# Patient Record
Sex: Female | Born: 1950 | Race: White | Hispanic: No | Smoking: Current some day smoker
Health system: Southern US, Community
[De-identification: ages and names within clinical notes are randomized; demographics above are authoritative.]

## PROBLEM LIST (undated history)

## (undated) DIAGNOSIS — D649 Anemia, unspecified: Secondary | ICD-10-CM

## (undated) DIAGNOSIS — C801 Malignant (primary) neoplasm, unspecified: Secondary | ICD-10-CM

## (undated) DIAGNOSIS — Z9289 Personal history of other medical treatment: Secondary | ICD-10-CM

## (undated) DIAGNOSIS — T4145XA Adverse effect of unspecified anesthetic, initial encounter: Secondary | ICD-10-CM

## (undated) DIAGNOSIS — R251 Tremor, unspecified: Secondary | ICD-10-CM

## (undated) DIAGNOSIS — I10 Essential (primary) hypertension: Secondary | ICD-10-CM

---

## 1998-10-29 ENCOUNTER — Ambulatory Visit (HOSPITAL_COMMUNITY): Admission: RE | Admit: 1998-10-29 | Discharge: 1998-10-29 | Payer: Self-pay | Admitting: Obstetrics and Gynecology

## 1998-10-29 ENCOUNTER — Encounter: Payer: Self-pay | Admitting: Obstetrics and Gynecology

## 1999-07-21 ENCOUNTER — Other Ambulatory Visit: Admission: RE | Admit: 1999-07-21 | Discharge: 1999-07-21 | Payer: Self-pay | Admitting: Obstetrics and Gynecology

## 1999-11-19 ENCOUNTER — Other Ambulatory Visit: Admission: RE | Admit: 1999-11-19 | Discharge: 1999-11-19 | Payer: Self-pay | Admitting: Obstetrics and Gynecology

## 1999-12-03 ENCOUNTER — Ambulatory Visit (HOSPITAL_COMMUNITY): Admission: RE | Admit: 1999-12-03 | Discharge: 1999-12-03 | Payer: Self-pay | Admitting: Obstetrics and Gynecology

## 1999-12-03 ENCOUNTER — Encounter: Payer: Self-pay | Admitting: Obstetrics and Gynecology

## 2000-09-07 ENCOUNTER — Other Ambulatory Visit: Admission: RE | Admit: 2000-09-07 | Discharge: 2000-09-07 | Payer: Self-pay | Admitting: Obstetrics and Gynecology

## 2001-03-10 ENCOUNTER — Other Ambulatory Visit: Admission: RE | Admit: 2001-03-10 | Discharge: 2001-03-10 | Payer: Self-pay | Admitting: Obstetrics and Gynecology

## 2002-02-06 ENCOUNTER — Other Ambulatory Visit: Admission: RE | Admit: 2002-02-06 | Discharge: 2002-02-06 | Payer: Self-pay | Admitting: Obstetrics and Gynecology

## 2002-05-01 ENCOUNTER — Ambulatory Visit (HOSPITAL_COMMUNITY): Admission: RE | Admit: 2002-05-01 | Discharge: 2002-05-01 | Payer: Self-pay | Admitting: Obstetrics and Gynecology

## 2002-05-01 ENCOUNTER — Encounter: Payer: Self-pay | Admitting: Obstetrics and Gynecology

## 2002-06-29 ENCOUNTER — Other Ambulatory Visit: Admission: RE | Admit: 2002-06-29 | Discharge: 2002-06-29 | Payer: Self-pay | Admitting: Obstetrics and Gynecology

## 2003-04-23 ENCOUNTER — Other Ambulatory Visit: Admission: RE | Admit: 2003-04-23 | Discharge: 2003-04-23 | Payer: Self-pay | Admitting: Obstetrics and Gynecology

## 2003-05-06 ENCOUNTER — Ambulatory Visit (HOSPITAL_COMMUNITY): Admission: RE | Admit: 2003-05-06 | Discharge: 2003-05-06 | Payer: Self-pay | Admitting: Obstetrics and Gynecology

## 2003-05-06 ENCOUNTER — Encounter: Payer: Self-pay | Admitting: Obstetrics and Gynecology

## 2003-12-13 ENCOUNTER — Ambulatory Visit (HOSPITAL_COMMUNITY): Admission: RE | Admit: 2003-12-13 | Discharge: 2003-12-13 | Payer: Self-pay | Admitting: Internal Medicine

## 2003-12-13 ENCOUNTER — Encounter (INDEPENDENT_AMBULATORY_CARE_PROVIDER_SITE_OTHER): Payer: Self-pay | Admitting: *Deleted

## 2004-01-06 ENCOUNTER — Ambulatory Visit (HOSPITAL_COMMUNITY): Admission: RE | Admit: 2004-01-06 | Discharge: 2004-01-06 | Payer: Self-pay | Admitting: Thoracic Surgery

## 2004-01-10 ENCOUNTER — Other Ambulatory Visit: Admission: RE | Admit: 2004-01-10 | Discharge: 2004-01-10 | Payer: Self-pay | Admitting: Obstetrics and Gynecology

## 2004-01-16 ENCOUNTER — Inpatient Hospital Stay (HOSPITAL_COMMUNITY): Admission: RE | Admit: 2004-01-16 | Discharge: 2004-01-28 | Payer: Self-pay | Admitting: Thoracic Surgery

## 2004-01-16 ENCOUNTER — Encounter (INDEPENDENT_AMBULATORY_CARE_PROVIDER_SITE_OTHER): Payer: Self-pay | Admitting: Specialist

## 2004-02-06 ENCOUNTER — Encounter: Admission: RE | Admit: 2004-02-06 | Discharge: 2004-02-06 | Payer: Self-pay | Admitting: Thoracic Surgery

## 2004-02-13 ENCOUNTER — Ambulatory Visit (HOSPITAL_COMMUNITY): Admission: RE | Admit: 2004-02-13 | Discharge: 2004-02-13 | Payer: Self-pay | Admitting: Thoracic Surgery

## 2004-02-20 ENCOUNTER — Encounter (HOSPITAL_COMMUNITY): Admission: RE | Admit: 2004-02-20 | Discharge: 2004-04-10 | Payer: Self-pay | Admitting: Hematology & Oncology

## 2004-03-03 ENCOUNTER — Encounter: Admission: RE | Admit: 2004-03-03 | Discharge: 2004-03-03 | Payer: Self-pay | Admitting: Thoracic Surgery

## 2004-04-21 ENCOUNTER — Encounter: Admission: RE | Admit: 2004-04-21 | Discharge: 2004-04-21 | Payer: Self-pay | Admitting: Thoracic Surgery

## 2004-05-15 ENCOUNTER — Ambulatory Visit (HOSPITAL_COMMUNITY): Admission: RE | Admit: 2004-05-15 | Discharge: 2004-05-15 | Payer: Self-pay | Admitting: Hematology & Oncology

## 2004-05-27 ENCOUNTER — Ambulatory Visit: Payer: Self-pay | Admitting: Hematology & Oncology

## 2004-06-02 ENCOUNTER — Encounter: Admission: RE | Admit: 2004-06-02 | Discharge: 2004-06-02 | Payer: Self-pay | Admitting: Thoracic Surgery

## 2004-06-12 ENCOUNTER — Ambulatory Visit (HOSPITAL_COMMUNITY): Admission: RE | Admit: 2004-06-12 | Discharge: 2004-06-12 | Payer: Self-pay | Admitting: Thoracic Surgery

## 2004-07-02 ENCOUNTER — Other Ambulatory Visit: Admission: RE | Admit: 2004-07-02 | Discharge: 2004-07-02 | Payer: Self-pay | Admitting: Obstetrics and Gynecology

## 2004-08-18 ENCOUNTER — Encounter: Admission: RE | Admit: 2004-08-18 | Discharge: 2004-08-18 | Payer: Self-pay | Admitting: Thoracic Surgery

## 2004-08-19 ENCOUNTER — Ambulatory Visit (HOSPITAL_COMMUNITY): Admission: RE | Admit: 2004-08-19 | Discharge: 2004-08-19 | Payer: Self-pay | Admitting: Hematology & Oncology

## 2004-08-27 ENCOUNTER — Ambulatory Visit: Payer: Self-pay | Admitting: Hematology & Oncology

## 2004-11-13 ENCOUNTER — Ambulatory Visit (HOSPITAL_COMMUNITY): Admission: RE | Admit: 2004-11-13 | Discharge: 2004-11-13 | Payer: Self-pay | Admitting: Hematology & Oncology

## 2004-11-26 ENCOUNTER — Ambulatory Visit: Payer: Self-pay | Admitting: Hematology & Oncology

## 2004-12-02 ENCOUNTER — Encounter: Admission: RE | Admit: 2004-12-02 | Discharge: 2004-12-02 | Payer: Self-pay | Admitting: Thoracic Surgery

## 2005-02-23 ENCOUNTER — Ambulatory Visit (HOSPITAL_COMMUNITY): Admission: RE | Admit: 2005-02-23 | Discharge: 2005-02-23 | Payer: Self-pay | Admitting: Hematology & Oncology

## 2005-03-02 ENCOUNTER — Other Ambulatory Visit: Admission: RE | Admit: 2005-03-02 | Discharge: 2005-03-02 | Payer: Self-pay | Admitting: *Deleted

## 2005-03-04 ENCOUNTER — Ambulatory Visit (HOSPITAL_COMMUNITY): Admission: RE | Admit: 2005-03-04 | Discharge: 2005-03-04 | Payer: Self-pay | Admitting: Internal Medicine

## 2005-03-11 ENCOUNTER — Ambulatory Visit: Payer: Self-pay | Admitting: Hematology & Oncology

## 2005-04-13 ENCOUNTER — Encounter: Admission: RE | Admit: 2005-04-13 | Discharge: 2005-04-13 | Payer: Self-pay | Admitting: Thoracic Surgery

## 2005-06-18 ENCOUNTER — Ambulatory Visit (HOSPITAL_COMMUNITY): Admission: RE | Admit: 2005-06-18 | Discharge: 2005-06-18 | Payer: Self-pay | Admitting: Hematology & Oncology

## 2005-06-24 ENCOUNTER — Ambulatory Visit: Payer: Self-pay | Admitting: Hematology & Oncology

## 2005-07-12 DIAGNOSIS — T8859XA Other complications of anesthesia, initial encounter: Secondary | ICD-10-CM

## 2005-07-12 DIAGNOSIS — C801 Malignant (primary) neoplasm, unspecified: Secondary | ICD-10-CM

## 2005-07-12 HISTORY — DX: Other complications of anesthesia, initial encounter: T88.59XA

## 2005-07-12 HISTORY — PX: OTHER SURGICAL HISTORY: SHX169

## 2005-07-12 HISTORY — DX: Malignant (primary) neoplasm, unspecified: C80.1

## 2005-09-27 ENCOUNTER — Other Ambulatory Visit: Admission: RE | Admit: 2005-09-27 | Discharge: 2005-09-27 | Payer: Self-pay | Admitting: Obstetrics & Gynecology

## 2005-10-14 ENCOUNTER — Ambulatory Visit: Payer: Self-pay | Admitting: Hematology & Oncology

## 2005-10-15 LAB — CBC WITH DIFFERENTIAL/PLATELET
Basophils Absolute: 0 10*3/uL (ref 0.0–0.1)
HCT: 40 % (ref 34.8–46.6)
HGB: 13.4 g/dL (ref 11.6–15.9)
LYMPH%: 39.2 % (ref 14.0–48.0)
MCH: 31.3 pg (ref 26.0–34.0)
MCHC: 33.6 g/dL (ref 32.0–36.0)
MONO#: 0.6 10*3/uL (ref 0.1–0.9)
NEUT%: 45.6 % (ref 39.6–76.8)
Platelets: 285 10*3/uL (ref 145–400)
lymph#: 1.9 10*3/uL (ref 0.9–3.3)

## 2005-10-15 LAB — COMPREHENSIVE METABOLIC PANEL
ALT: 16 U/L (ref 0–40)
AST: 26 U/L (ref 0–37)
Albumin: 4.8 g/dL (ref 3.5–5.2)
Alkaline Phosphatase: 49 U/L (ref 39–117)
BUN: 12 mg/dL (ref 6–23)
CO2: 28 mEq/L (ref 19–32)
Calcium: 9.3 mg/dL (ref 8.4–10.5)
Chloride: 103 mEq/L (ref 96–112)
Creatinine, Ser: 0.7 mg/dL (ref 0.4–1.2)
Glucose, Bld: 93 mg/dL (ref 70–99)
Potassium: 4 mEq/L (ref 3.5–5.3)
Sodium: 139 mEq/L (ref 135–145)
Total Bilirubin: 0.4 mg/dL (ref 0.3–1.2)
Total Protein: 7.1 g/dL (ref 6.0–8.3)

## 2005-10-19 ENCOUNTER — Ambulatory Visit (HOSPITAL_COMMUNITY): Admission: RE | Admit: 2005-10-19 | Discharge: 2005-10-19 | Payer: Self-pay | Admitting: Hematology & Oncology

## 2005-10-20 ENCOUNTER — Encounter: Admission: RE | Admit: 2005-10-20 | Discharge: 2005-10-20 | Payer: Self-pay | Admitting: Thoracic Surgery

## 2006-01-06 ENCOUNTER — Other Ambulatory Visit: Admission: RE | Admit: 2006-01-06 | Discharge: 2006-01-06 | Payer: Self-pay | Admitting: Obstetrics & Gynecology

## 2006-02-09 ENCOUNTER — Ambulatory Visit: Payer: Self-pay | Admitting: Hematology & Oncology

## 2006-02-11 LAB — COMPREHENSIVE METABOLIC PANEL
ALT: 12 U/L (ref 0–40)
AST: 20 U/L (ref 0–37)
BUN: 13 mg/dL (ref 6–23)
Calcium: 10 mg/dL (ref 8.4–10.5)
Chloride: 102 mEq/L (ref 96–112)
Creatinine, Ser: 0.67 mg/dL (ref 0.40–1.20)
Total Bilirubin: 0.4 mg/dL (ref 0.3–1.2)

## 2006-02-11 LAB — CBC WITH DIFFERENTIAL/PLATELET
Eosinophils Absolute: 0.1 10*3/uL (ref 0.0–0.5)
LYMPH%: 39.4 % (ref 14.0–48.0)
MONO#: 0.6 10*3/uL (ref 0.1–0.9)
NEUT#: 1.8 10*3/uL (ref 1.5–6.5)
Platelets: 293 10*3/uL (ref 145–400)
RBC: 4.12 10*6/uL (ref 3.70–5.32)
WBC: 4.1 10*3/uL (ref 3.9–10.0)

## 2006-02-18 ENCOUNTER — Ambulatory Visit (HOSPITAL_COMMUNITY): Admission: RE | Admit: 2006-02-18 | Discharge: 2006-02-18 | Payer: Self-pay | Admitting: Hematology & Oncology

## 2006-04-08 IMAGING — CR DG CHEST 1V PORT
1 series · 1 of 1 positions shown · non-contrast
Comparison: None.

CLINICAL DATA: Post-bronchoscopy on the right for a right mid lung opacity.
 CHEST PORTABLE ONE VIEW - 12/13/03 AT 4665 HOURS

[view not recorded]
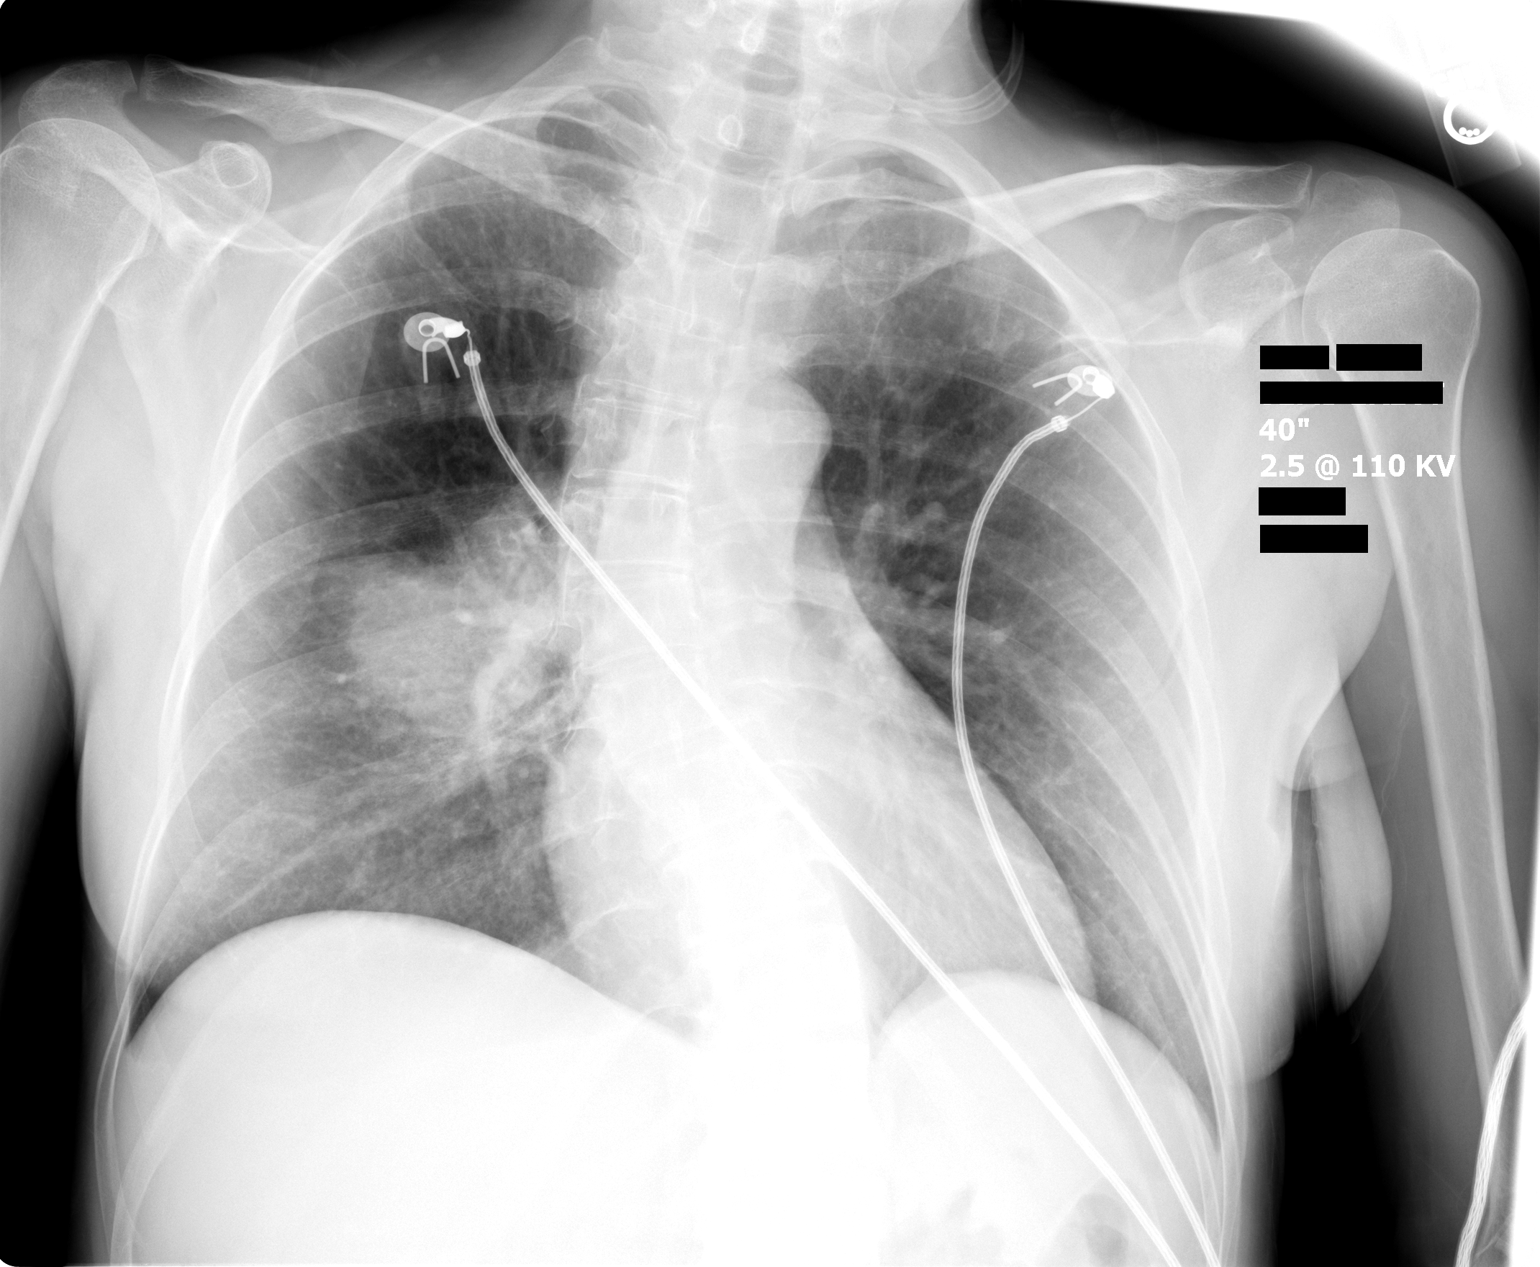

[1 of 1 positions shown; findings below may reference images not displayed]

There is no evidence of pneumothorax or pneumomediastinum post-bronchoscopy.  Mass-like opacity in the right mid lung is noted.  The lungs appear clear otherwise.  Thoracic scoliosis convex right with compensatory thoracolumbar scoliosis convex left is noted.  
 IMPRESSION
 No pneumothorax or pneumomediastinum post bronchoscopy  Mass-like opacity in the right mid lung.

## 2006-05-02 IMAGING — CT NM PET TUM IMG SKULL BASE T - THIGH
1 of 3 series · 1 of 25 positions shown · IV contrast ([ID])
Comparison: No CT images are available for comparison.  Comparison made to chest x-ray done 12/13/03.

CLINICAL DATA: Right lower lobe mass seen on CT done at [HOSPITAL].

 FDG PET-CT TUMOR IMAGING (SKULL BASE TO THIGHS)
 Patient Weight:  125 pounds.  
 Fasting Blood Glucose: 91
TECHNIQUE: 15.7 mCi F-18 FDG were administered via right antecubital fossa.  Full ring PET imaging was performed from the skull base through the mid-thighs 60 minutes after injection.  CT data was obtained and used for attenuation correction and anatomic localization only.  (This was not acquired as a diagnostic CT examination.)

[Series 2: ct images · axial · 3.8mm · 0.98mm/px · 1 of 267 slices shown]
[im 267/267  brain]
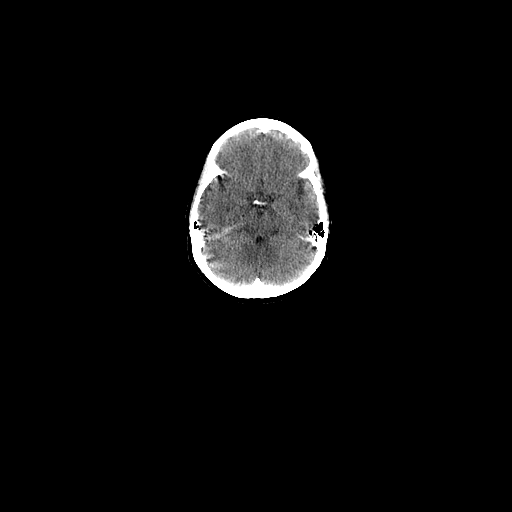

[1 of 25 positions shown; findings below may reference images not displayed]

FINDINGS: There is avid FDG accumulation noted in the right perihilar mass.  This has a maximum SUV of 15.5.  On the associated CT images obtained for CT PET fusion, this mass in the superior segment of the right lower lobe and has small areas of cavitation centrally.  No abnormal FDG accumulation noted in the right hilum or mediastinum.  No other areas of abnormal FDG accumulation seen in the neck, abdomen or pelvis.  
 IMPRESSION
 Marked hypermetabolic activity within the large mass in the superior segment of the right lower lobe compatible with lung cancer.  There is no evidence of abnormal hilar or mediastinal activity or other metastatic disease.

## 2006-05-11 ENCOUNTER — Encounter: Admission: RE | Admit: 2006-05-11 | Discharge: 2006-05-11 | Payer: Self-pay | Admitting: Thoracic Surgery

## 2006-07-29 ENCOUNTER — Ambulatory Visit (HOSPITAL_COMMUNITY): Admission: RE | Admit: 2006-07-29 | Discharge: 2006-07-29 | Payer: Self-pay | Admitting: Obstetrics and Gynecology

## 2006-08-16 ENCOUNTER — Ambulatory Visit: Payer: Self-pay | Admitting: Hematology & Oncology

## 2006-08-17 ENCOUNTER — Ambulatory Visit (HOSPITAL_COMMUNITY): Admission: RE | Admit: 2006-08-17 | Discharge: 2006-08-17 | Payer: Self-pay | Admitting: Hematology & Oncology

## 2006-08-19 LAB — CBC WITH DIFFERENTIAL/PLATELET
BASO%: 0.6 % (ref 0.0–2.0)
Basophils Absolute: 0 10*3/uL (ref 0.0–0.1)
Eosinophils Absolute: 0.1 10*3/uL (ref 0.0–0.5)
HCT: 34.6 % — ABNORMAL LOW (ref 34.8–46.6)
HGB: 11.8 g/dL (ref 11.6–15.9)
MONO#: 0.5 10*3/uL (ref 0.1–0.9)
NEUT#: 2.8 10*3/uL (ref 1.5–6.5)
NEUT%: 56.6 % (ref 39.6–76.8)
Platelets: 320 10*3/uL (ref 145–400)
WBC: 5 10*3/uL (ref 3.9–10.0)
lymph#: 1.6 10*3/uL (ref 0.9–3.3)

## 2006-08-19 LAB — COMPREHENSIVE METABOLIC PANEL
ALT: 15 U/L (ref 0–35)
BUN: 11 mg/dL (ref 6–23)
CO2: 28 mEq/L (ref 19–32)
Calcium: 10 mg/dL (ref 8.4–10.5)
Chloride: 101 mEq/L (ref 96–112)
Creatinine, Ser: 0.54 mg/dL (ref 0.40–1.20)
Glucose, Bld: 116 mg/dL — ABNORMAL HIGH (ref 70–99)

## 2006-12-27 ENCOUNTER — Other Ambulatory Visit: Admission: RE | Admit: 2006-12-27 | Discharge: 2006-12-27 | Payer: Self-pay | Admitting: Obstetrics and Gynecology

## 2007-02-15 ENCOUNTER — Ambulatory Visit: Payer: Self-pay | Admitting: Hematology & Oncology

## 2007-02-17 LAB — CBC WITH DIFFERENTIAL/PLATELET
BASO%: 0.4 % (ref 0.0–2.0)
Eosinophils Absolute: 0.2 10*3/uL (ref 0.0–0.5)
HCT: 29.7 % — ABNORMAL LOW (ref 34.8–46.6)
MCHC: 32.4 g/dL (ref 32.0–36.0)
MONO#: 1.6 10*3/uL — ABNORMAL HIGH (ref 0.1–0.9)
NEUT#: 5.2 10*3/uL (ref 1.5–6.5)
NEUT%: 60.6 % (ref 39.6–76.8)
WBC: 8.7 10*3/uL (ref 3.9–10.0)
lymph#: 1.6 10*3/uL (ref 0.9–3.3)

## 2007-02-17 LAB — COMPREHENSIVE METABOLIC PANEL
ALT: 16 U/L (ref 0–35)
BUN: 10 mg/dL (ref 6–23)
CO2: 24 mEq/L (ref 19–32)
Calcium: 9.3 mg/dL (ref 8.4–10.5)
Chloride: 101 mEq/L (ref 96–112)
Creatinine, Ser: 0.65 mg/dL (ref 0.40–1.20)

## 2007-02-21 ENCOUNTER — Ambulatory Visit (HOSPITAL_COMMUNITY): Admission: RE | Admit: 2007-02-21 | Discharge: 2007-02-21 | Payer: Self-pay | Admitting: Hematology & Oncology

## 2007-04-03 ENCOUNTER — Ambulatory Visit (HOSPITAL_COMMUNITY): Admission: RE | Admit: 2007-04-03 | Discharge: 2007-04-03 | Payer: Self-pay | Admitting: Hematology & Oncology

## 2007-06-29 ENCOUNTER — Other Ambulatory Visit: Admission: RE | Admit: 2007-06-29 | Discharge: 2007-06-29 | Payer: Self-pay | Admitting: Obstetrics and Gynecology

## 2007-08-14 ENCOUNTER — Ambulatory Visit: Payer: Self-pay | Admitting: Hematology & Oncology

## 2007-08-16 LAB — CBC WITH DIFFERENTIAL/PLATELET
Basophils Absolute: 0 10*3/uL (ref 0.0–0.1)
Eosinophils Absolute: 0.1 10*3/uL (ref 0.0–0.5)
HCT: 32.2 % — ABNORMAL LOW (ref 34.8–46.6)
HGB: 10.7 g/dL — ABNORMAL LOW (ref 11.6–15.9)
LYMPH%: 28.6 % (ref 14.0–48.0)
MONO#: 0.7 10*3/uL (ref 0.1–0.9)
NEUT#: 3.3 10*3/uL (ref 1.5–6.5)
NEUT%: 57.8 % (ref 39.6–76.8)
Platelets: 293 10*3/uL (ref 145–400)
WBC: 5.7 10*3/uL (ref 3.9–10.0)

## 2007-08-16 LAB — COMPREHENSIVE METABOLIC PANEL
CO2: 29 mEq/L (ref 19–32)
Creatinine, Ser: 0.78 mg/dL (ref 0.40–1.20)
Glucose, Bld: 103 mg/dL — ABNORMAL HIGH (ref 70–99)
Sodium: 138 mEq/L (ref 135–145)
Total Bilirubin: 0.6 mg/dL (ref 0.3–1.2)
Total Protein: 6.4 g/dL (ref 6.0–8.3)

## 2007-08-17 ENCOUNTER — Ambulatory Visit (HOSPITAL_COMMUNITY): Admission: RE | Admit: 2007-08-17 | Discharge: 2007-08-17 | Payer: Self-pay | Admitting: Hematology & Oncology

## 2008-01-08 ENCOUNTER — Other Ambulatory Visit: Admission: RE | Admit: 2008-01-08 | Discharge: 2008-01-08 | Payer: Self-pay | Admitting: Obstetrics & Gynecology

## 2008-02-10 ENCOUNTER — Ambulatory Visit: Payer: Self-pay | Admitting: Hematology & Oncology

## 2008-02-14 ENCOUNTER — Ambulatory Visit (HOSPITAL_COMMUNITY): Admission: RE | Admit: 2008-02-14 | Discharge: 2008-02-14 | Payer: Self-pay | Admitting: Hematology & Oncology

## 2008-02-14 LAB — CBC WITH DIFFERENTIAL/PLATELET
BASO%: 1.3 % (ref 0.0–2.0)
EOS%: 3 % (ref 0.0–7.0)
LYMPH%: 24.1 % (ref 14.0–48.0)
MCHC: 32.5 g/dL (ref 32.0–36.0)
MONO#: 0.4 10*3/uL (ref 0.1–0.9)
MONO%: 10.5 % (ref 0.0–13.0)
Platelets: 334 10*3/uL (ref 145–400)
RBC: 4.36 10*6/uL (ref 3.70–5.32)
WBC: 4.1 10*3/uL (ref 3.9–10.0)

## 2008-02-14 LAB — COMPREHENSIVE METABOLIC PANEL
Alkaline Phosphatase: 57 U/L (ref 39–117)
BUN: 11 mg/dL (ref 6–23)
Glucose, Bld: 88 mg/dL (ref 70–99)
Sodium: 140 mEq/L (ref 135–145)
Total Bilirubin: 0.6 mg/dL (ref 0.3–1.2)

## 2008-02-21 ENCOUNTER — Ambulatory Visit: Payer: Self-pay | Admitting: Hematology & Oncology

## 2008-04-09 ENCOUNTER — Ambulatory Visit: Payer: Self-pay | Admitting: Hematology & Oncology

## 2008-04-10 LAB — CBC WITH DIFFERENTIAL (CANCER CENTER ONLY)
BASO#: 0.1 10*3/uL (ref 0.0–0.2)
EOS%: 2.6 % (ref 0.0–7.0)
Eosinophils Absolute: 0.1 10*3/uL (ref 0.0–0.5)
HCT: 36.7 % (ref 34.8–46.6)
HGB: 12.4 g/dL (ref 11.6–15.9)
MCH: 27.5 pg (ref 26.0–34.0)
MCHC: 33.7 g/dL (ref 32.0–36.0)
MCV: 82 fL (ref 81–101)
MONO%: 10.4 % (ref 0.0–13.0)
NEUT%: 57.6 % (ref 39.6–80.0)
RBC: 4.49 10*6/uL (ref 3.70–5.32)

## 2008-04-12 LAB — RETICULOCYTES (CHCC)
ABS Retic: 40.1 10*3/uL (ref 19.0–186.0)
RBC.: 4.45 MIL/uL (ref 3.87–5.11)

## 2008-05-23 ENCOUNTER — Other Ambulatory Visit: Admission: RE | Admit: 2008-05-23 | Discharge: 2008-05-23 | Payer: Self-pay | Admitting: Obstetrics & Gynecology

## 2008-07-12 DIAGNOSIS — Z9289 Personal history of other medical treatment: Secondary | ICD-10-CM

## 2008-07-12 HISTORY — DX: Personal history of other medical treatment: Z92.89

## 2008-08-01 ENCOUNTER — Encounter: Admission: RE | Admit: 2008-08-01 | Discharge: 2008-08-01 | Payer: Self-pay | Admitting: Internal Medicine

## 2008-08-12 ENCOUNTER — Ambulatory Visit: Payer: Self-pay | Admitting: Hematology & Oncology

## 2008-08-13 ENCOUNTER — Ambulatory Visit (HOSPITAL_BASED_OUTPATIENT_CLINIC_OR_DEPARTMENT_OTHER): Admission: RE | Admit: 2008-08-13 | Discharge: 2008-08-13 | Payer: Self-pay | Admitting: Hematology & Oncology

## 2008-08-13 ENCOUNTER — Ambulatory Visit: Payer: Self-pay | Admitting: Diagnostic Radiology

## 2008-08-13 LAB — CMP (CANCER CENTER ONLY)
ALT(SGPT): 19 U/L (ref 10–47)
CO2: 31 mEq/L (ref 18–33)
Creat: 0.6 mg/dl (ref 0.6–1.2)
Total Bilirubin: 0.6 mg/dl (ref 0.20–1.60)

## 2008-08-28 LAB — CBC WITH DIFFERENTIAL (CANCER CENTER ONLY)
BASO#: 0.1 10*3/uL (ref 0.0–0.2)
Eosinophils Absolute: 0.1 10*3/uL (ref 0.0–0.5)
HCT: 41.9 % (ref 34.8–46.6)
HGB: 13.7 g/dL (ref 11.6–15.9)
LYMPH#: 1.3 10*3/uL (ref 0.9–3.3)
MCHC: 32.8 g/dL (ref 32.0–36.0)
MONO#: 0.5 10*3/uL (ref 0.1–0.9)
NEUT%: 54.9 % (ref 39.6–80.0)
RBC: 4.46 10*6/uL (ref 3.70–5.32)
WBC: 4.1 10*3/uL (ref 3.9–10.0)

## 2008-08-28 LAB — CHCC SATELLITE - SMEAR

## 2008-08-28 LAB — RETICULOCYTES (CHCC)
ABS Retic: 35.6 10*3/uL (ref 19.0–186.0)
Retic Ct Pct: 0.8 % (ref 0.4–3.1)

## 2008-08-28 LAB — FERRITIN: Ferritin: 59 ng/mL (ref 10–291)

## 2009-03-25 ENCOUNTER — Ambulatory Visit: Payer: Self-pay | Admitting: Hematology & Oncology

## 2009-03-26 ENCOUNTER — Ambulatory Visit (HOSPITAL_BASED_OUTPATIENT_CLINIC_OR_DEPARTMENT_OTHER): Admission: RE | Admit: 2009-03-26 | Discharge: 2009-03-26 | Payer: Self-pay | Admitting: Hematology & Oncology

## 2009-03-26 ENCOUNTER — Ambulatory Visit: Payer: Self-pay | Admitting: Diagnostic Radiology

## 2009-03-26 LAB — CBC WITH DIFFERENTIAL (CANCER CENTER ONLY)
BASO%: 0.7 % (ref 0.0–2.0)
HCT: 37.8 % (ref 34.8–46.6)
LYMPH#: 1.3 10*3/uL (ref 0.9–3.3)
MONO#: 0.4 10*3/uL (ref 0.1–0.9)
NEUT#: 2.3 10*3/uL (ref 1.5–6.5)
NEUT%: 55.6 % (ref 39.6–80.0)
RDW: 11.4 % (ref 10.5–14.6)
WBC: 4.2 10*3/uL (ref 3.9–10.0)

## 2009-03-26 LAB — CMP (CANCER CENTER ONLY)
ALT(SGPT): 18 U/L (ref 10–47)
Albumin: 3.6 g/dL (ref 3.3–5.5)
CO2: 31 mEq/L (ref 18–33)
Chloride: 106 mEq/L (ref 98–108)
Potassium: 4.7 mEq/L (ref 3.3–4.7)
Sodium: 144 mEq/L (ref 128–145)
Total Bilirubin: 0.3 mg/dl (ref 0.20–1.60)
Total Protein: 7.5 g/dL (ref 6.4–8.1)

## 2009-03-26 LAB — FERRITIN: Ferritin: 6 ng/mL — ABNORMAL LOW (ref 10–291)

## 2009-05-13 ENCOUNTER — Ambulatory Visit: Payer: Self-pay | Admitting: Hematology & Oncology

## 2010-08-01 ENCOUNTER — Encounter: Payer: Self-pay | Admitting: Thoracic Surgery

## 2010-08-02 ENCOUNTER — Encounter: Payer: Self-pay | Admitting: Obstetrics and Gynecology

## 2010-08-02 ENCOUNTER — Encounter: Payer: Self-pay | Admitting: Thoracic Surgery

## 2010-11-27 NOTE — Consult Note (Signed)
NAME:  [name], [name]                           ACCOUNT NO.:  [id number]   MEDICAL RECORD NO.:  [id number]                   PATIENT TYPE:  INP   LOCATION:  3314                                 FACILITY:  [facility]   PHYSICIAN:  Rose Phi. Myna Hidalgo, M.D.              DATE OF BIRTH:  04/24/1951   DATE OF CONSULTATION:  01/19/2004  DATE OF DISCHARGE:                                   CONSULTATION   REFERRING PHYSICIAN:  Dr. Ines Bloomer.   REASON FOR REFERRAL:  Stage IB (T2, N0, M0) non-small-cell lung cancer of  the right lower lung.   HISTORY OF PRESENT ILLNESS:  Ms. Dutkiewicz is a very nice 60 year old white  female with past history of tobacco use.  She has her own textile  distribution company.  She apparently began to have some congestion while  out at a convention.  She had no obvious hemoptysis.  She had no chest pain.  She had a low fever.  She did have a little bit of a productive cough.   She subsequently underwent a chest x-ray.  This showed a right lower lobe  lung mass.   A followup CT scan was done which confirmed a 5.5-cm right lower lobe mass.  No obvious mediastinal adenopathy was noted.   She did undergo bronchoscopy by Dr. Joni Fears D. Young.  I think biopsies were  taken and no obvious malignancy was noted, although there were atypical  cells.   She was then referred to Dr. Edwyna Shell.  Dr. Edwyna Shell went ahead and proceeded  with a PET scan.  PET scan was done.  I do not know any results that were  from this procedure.   She subsequently was taken to surgery on January 16, 2004.  A right lower  lobectomy was done.  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  All margins were negative.  There was  no pleural invasion.  There was no evidence of vascular infarction.  Seven  lymph nodes were examined and all were negative.  As such, she is a stage IB  (T2, N0, M0) non-small-cell lung cancer of the right lower lobe.   She is recovering  quite well.   She has had no weight loss.  Her appetite has remained good.  She has had no  fatigue or weakness.   PAST MEDICAL HISTORY:  1. Depression/anxiety.  2. Osteoporosis.   ALLERGIES:  Her allergies are CODEINE.   MEDICATIONS ON ADMISSION:  1. Fosamax 70 mg p.o. q.wk.  2. Wellbutrin XL 150 mg p.o. daily.   SOCIAL HISTORY:  She does have history of tobacco use.  She has about a 90-  pack-year history of tobacco use.  She stopped back in 2002.  She has no  obvious occupational exposures.  She does have 1 glass of wine a day.   FAMILY HISTORY:  Her family history  is remarkable for a sister who died at  age 96 from breast cancer.  Mother had bladder cancer and died at age 93.   REVIEW OF SYSTEMS:  Review of systems as in history of present illness.   PHYSICAL EXAM:  GENERAL:  This is a well-developed, well-nourished white  female in no obvious distress.  VITAL SIGNS:  Vital signs show temperature of 99, pulse of 112, respiratory  rate 22, blood pressure is 130/80.  HEAD AND NECK:  Exam shows no ocular or oral lesions.  She has no palpable  cervical or supraclavicular lymph nodes.  Thyroid is nonpalpable.  LUNGS:  Lungs are with decreased breath sounds at the bases bilaterally.  Breath sounds are more decreased on the right side than on the left side.  CARDIAC:  Tachycardic but regular.  There are no murmurs, rubs or bruits.  ABDOMEN:  Soft with good bowel sounds.  There is no palpable abdominal mass.  No palpable hepatosplenomegaly.  BACK:  No tenderness of the spine, ribs or hips.  EXTREMITIES:  Extremities show no clubbing, cyanosis, or edema.  NEUROLOGICAL:  Exam shows no focal neurologic deficits.   LABORATORY STUDIES:  White cell count 6.4, hemoglobin 11, hematocrit 33 and  platelet count 327,000.  Sodium 140, potassium 4.0, BUN 2, creatinine 0.6.  Calcium 8.2 with an albumin of 2.6.  LFTs are within normal limits.   IMPRESSION:  Ms. Acheampong is a 60 year old female  with stage IB non-small-cell  lung cancer of the right lower lobe.  She underwent resection for this.   She clearly is a candidate for adjuvant chemotherapy.  She has a good  performance status and as such, I think would be able to tolerate  combination chemotherapy.   We will go ahead and plan for followup in another 3 weeks or so.   I would like to get a bone scan and CT of the brain to complete the staging  process.   Ms. Bunner is quite nice.  She understands the reasons for recommending  adjuvant chemotherapy.  She is agreeable to proceed.   She will need to have a Port-A-Cath placed, once we get close to the time of  therapy.                                               Rose Phi. Myna Hidalgo, M.D.    PRE/MEDQ  D:  01/21/2004  T:  01/21/2004  Job:  [id number]   cc:   Ines Bloomer, M.D.  [address]  [place]  [place] [address]   Joni Fears D. Young, M.D.  [address] [place]  [place] [address]  Fax: [phone]

## 2010-11-27 NOTE — Op Note (Signed)
NAMEANGELLI, BARUCH NO.:  [id number]   MEDICAL RECORD NO.:  [id number]          PATIENT TYPE:  OIB   LOCATION:  2869                         FACILITY:  [facility]   PHYSICIAN:  Ines Bloomer, M.D. DATE OF BIRTH:  [birth year]   DATE OF PROCEDURE:  06/12/2004  DATE OF DISCHARGE:  06/12/2004                                 OPERATIVE REPORT   PREOPERATIVE DIAGNOSIS:  Status post chemotherapy.   POSTOPERATIVE DIAGNOSIS:  Status post chemotherapy.   OPERATION PERFORMED:  Removal of right internal jugular Port-A-Cath.   After prepping and draping the right chest and local anesthesia with  Xylocaine and IV sedation, the incision was opened, dissection was carried  down through the subcutaneous tissue down to the Port-A-Cath tubing.  The  stitch holding the Port-A-Cath was cut and removed and then the Port-A-Cath  tubing was removed and then the Port-A-Cath reservoir was dissected out.  The wound was closed with 3-0 Vicryl and Dermabond for the skin.  The  patient tolerated the procedure well and was returned to the recovery room  in stable condition.       DPB/MEDQ  D:  06/12/2004  T:  06/12/2004  Job:  [id number]

## 2010-11-27 NOTE — H&P (Signed)
[name], [name]                           ACCOUNT NO.:  [id number]   MEDICAL RECORD NO.:  [id number]                   PATIENT TYPE:  INP   LOCATION:  NA                                   FACILITY:  [facility]   PHYSICIAN:  Ines Bloomer, M.D.              DATE OF BIRTH:  06-08-1951   DATE OF ADMISSION:  01/16/2004  DATE OF DISCHARGE:                                HISTORY & PHYSICAL   CHIEF COMPLAINT:  Recent upper respiratory and sinus symptoms.   HISTORY OF PRESENT ILLNESS:  This is a 60 year old female referred by Dr.  Joni Fears D. Young to Dr. Ines Bloomer for evaluation of an abnormal CT  scan of the chest.  The patient was recently in [facility] for a trade show,  where she developed what she felt to be upper respiratory and sinus-type  symptoms.  During the evaluation, she underwent a chest x-ray which revealed  a 5.5 cm right lower lobe mass which was confirmed on a chest CT scan.  The  CT scan revealed no evidence of adenopathy.  She was subsequently referred  to Dr. Maple Hudson for further evaluation, which included a bronchoscopy, which  showed some atypical cells, as well as granulomatous inflammation.  Pulmonary function tests were within normal range.  There was no improvement  with bronchodilators.  She was seen and evaluated by Dr. Edwyna Shell, and he  recommends a resection of the mass.  Currently the patient denies a cough, sputum production, sputum production,  fevers, chills, or gastroesophageal reflux disease symptoms.  She  additionally denies shortness of breath, dyspnea on exertion, paroxysmal  nocturnal dyspnea, chest pain, or palpitations.  She does have significant  anxiety associated with the situation, and has had quite a bit of difficulty  with her nerves.  She did also have a short episode upon returning from her  flight from [facility] of approximately three days of significant peripheral  edema in her lower extremities.  This has resolved.  She also has some  night  sweats.  She has no history of significant recent foreign travel.  She has  no exotic pets.  She does have a history or tobacco use, which is quite  significant, approximately two to three packs per day for 32 years.  She  reports quitting in the year 2002.   PAST MEDICAL HISTORY:  1. Depression and anxiety.  2. Osteoporosis.  3. Emphysema on chest CT scan with normal pulmonary function tests.  4. 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   PAST SURGICAL HISTORY:  None.   CURRENT MEDICATIONS:  1. Fosamax q. week.  2. Wellbutrin 150 mg daily.   ALLERGIES:  Include an intolerance to CODEINE where she feels quite poorly.   REVIEW OF SYSTEMS:  Otherwise noncontributory for a full system review.   FAMILY HISTORY:  Remarkable for cancer in a sister who was deceased at age  85, from breast cancer.  Her mother deceased at age 26, from bladder cancer.  Her father is also deceased, having complications from diabetes mellitus.   SOCIAL HISTORY:  She is married with two children.  She works as an  Human resources officer.  Alcohol use:  She uses wine on a daily  basis.  Tobacco use:  As described above.   PHYSICAL EXAMINATION:  VITAL SIGNS:  Blood pressure 130/80, pulse rate 112,  respirations 14.  GENERAL:  This is a 60 year old white female, in no acute distress, but  quite anxious.  She is somewhat tremulous.  HEENT:  Normocephalic, atraumatic.  Pupils equal, round, reactive to light.  Extraocular movements intact.  Pharynx clear of exudates or erythema.  NECK:  Supple, no jugular venous distention.  No bruits, no lymphadenopathy.  CHEST:  Symmetrical on inspiration.  No wheezes, rhonchi, or rales.  HEART:  A regular rate and rhythm, tachycardic.  No murmurs, gallops, or  rubs.  ABDOMEN:  Soft, nontender.  Normoactive bowel sounds.  No masses, no bruits.  GENITOURINARY:  Deferred.  RECTAL:  Deferred.  EXTREMITIES:  No  clubbing, cyanosis, or edema.  No ulcerations.  SKIN:  Warm, no lesions of significance.  PULSES:  Peripheral pulses:  Dorsalis pedis 2+ on the left and 1+ on the  right.  Posterior tibial 1+ bilaterally.  NEUROLOGIC:  Nonfocal.  Gait is steady.  Strength is within normal limits.   ASSESSMENT:  Right lower lobe mass, suspicious for malignancy.  Other  differential for granulomatous disease, etc remain in the differential.   PLAN:  For a video-assisted thoracoscopic surgery and a resection of the  mass.      Rowe Clack, P.A.-C.                    Ines Bloomer, M.D.    Sherryll Burger  D:  01/14/2004  T:  01/14/2004  Job:  [id number]   cc:   Marcene Duos, M.D.  [address]  [place]  [place] [address]  Fax: [phone]   Clinton D. Young, M.D.  [address] [place]  [place] [address]  Fax: [phone]

## 2010-11-27 NOTE — Discharge Summary (Signed)
NAMEJOSANNE, [name]                           ACCOUNT NO.:  [id number]   MEDICAL RECORD NO.:  [id number]                   PATIENT TYPE:  INP   LOCATION:  2032                                 FACILITY:  [facility]   PHYSICIAN:  [name], M.D.              DATE OF BIRTH:  October 18, 1950   DATE OF ADMISSION:  01/16/2004  DATE OF DISCHARGE:  01/27/2004                                 DISCHARGE SUMMARY   ADMISSION DIAGNOSIS:  Right lower lobe mass.   PAST MEDICAL HISTORY AND DISCHARGE DIAGNOSES:  1. Depression and anxiety.  2. Osteoporosis.  3. Emphysema on chest CT scan with normal pulmonary function tests.  4. __________ degenerative joint disease of spine also noted on CT of the     spine.  5. Right lower lobe mass status post right video-assisted thoracoscopic     surgery and right thoracotomy as well as right lower lobectomy.  6. Stage Ib non-small-cell lung cancer of the right lower lobe.   ALLERGIES:  Intolerance to CODEINE.   BRIEF HISTORY:  The patient is a 60 year old female referred by Dr. Jetty Hampton to Dr. Edwyna Hampton for evaluation of abnormal CT scan of the chest.  The  patient was recently in [facility] where she developed what she thought to be  an upper respiratory and sinus infection.  During the evaluation of those  symptoms, she underwent a chest x-ray which revealed a 5.5 cm right lower  lobe mass which was subsequently confirmed on chest CT scan.  The CT scan,  however, revealed no evidence of adenopathy.  The patient was then referred  to Dr. Maple Hampton for further evaluation which included a bronchoscopy.  This  showed some atypical cells as well as granulomatous inflammation.  PFTs were  within normal range.  The patient experienced no improvement in symptoms  with bronchodilators.  The patient was then subsequently referred to Dr.  Edwyna Hampton for evaluation.  He recommended resection of the mass.  The patient  denied constitutional symptoms at the time of evaluation.   The patient has  no significant history of recent foreign travel and no exotic pets.  She  does, however, have a history of tobacco abuse which is quite significant.  She smoked approximately two to three packs per day for the past 32 years  and reports quitting in the year 2002.   HOSPITAL COURSE:  The patient was admitted and taken to the OR on January 16, 2004, for a right video-assisted thorascopic surgery, right thoracotomy, and  right lower lobectomy.  Specimen was sent to pathology, and this revealed  non-small-cell lung cancer of the right lower lobe.  The patient tolerated  the procedure well and was hemodynamically stable immediately  postoperatively.  The patient was transferred from the OR to the  postanesthesia care unit in stable condition.   On postoperative day #1, the patient was doing well.  She did have an air  leak present.  The chest tube was continued at that time.  Pathology  revealed stage Ib non-small-cell lung cancer, T2, N0, M0.  The patient's  chest tube was discontinued on postoperative day #2.  The patient's air leak  was decreasing at that time.  Her chest x-ray was stable.  Dr. Myna Hampton, of  the oncology service, was consulted on January 19, 2004.  After examination of  the patient, it was Dr. Gustavo Hampton recommendation that the patient was a  candidate for combination chemotherapy.  He recommended a bone scan and CT  of the brain to complete the staging process.   The patient continued to remain in stable condition postoperatively.  Bone  scan and CT scan were obtained on January 20, 2004, and both were negative.  The air leak persisted in the chest tube.  However, it continued to  decrease.  Therefore, the suction was decreased until the chest tube was  discontinued on postoperative day #11.   On the day prior to discharge, the patient was doing well.  She was  afebrile. Vital signs were stable.  On physical exam, cardiac was regular  rate and rhythm.  Pulmonary  exam revealed slightly diminished breath sounds  in the right base, otherwise clear.  The abdomen was soft, nontender,  nondistended, normoactive bowel sounds.  There was no edema present in the  extremities, and the incisions were healing well.  Chest x-ray was stable  with a small right apical pneumothorax.  Chest tube was removed, and  followup chest x-ray revealed no change.  The patient is in stable condition  at this time, and as long as she remains so, she should be ready for  discharge on the morning of January 28, 2004 pending morning round  reevaluation.   LABORATORY AND X-RAY DATA:  CBC and BMP on January 17, 2004.  Hemoglobin 11.4,  hematocrit 34, white count 10.2, platelets 322.  Sodium 134, potassium 4.1,  BUN 8, creatinine 0.5, glucose 139.   CONDITION ON DISCHARGE:  Stable.   DISCHARGE INSTRUCTIONS:   DISCHARGE MEDICATIONS:  1. Fosamax weekly.  2. Wellbutrin 150 mg p.o. daily  3. Voltaren 75 mg p.o. b.i.d.  4. Tylox 1 to 2 p.o. q.4-6h. p.r.n. pain.   ACTIVITY:  1. No driving until the patient follows up with Dr. Edwyna Hampton.  2. The patient is to continue daily breathing and walking exercises.  3. The patient is to avoid strenuous activity and to refrain from lifting     anything over 10 pounds.   DIET:  No restrictions.   WOUND CARE:  Clean incisions daily with soap and water.  If incisions become  red, swollen, drainage, patient has fever of more than 101 degrees F, she is  to call CVTS office at [phone].   FOLLOW UP:  1. [facility] on February 06, 2004, at 2:20 to have a PA and     lateral chest x-ray taken.  2. Dr. Edwyna Hampton on February 06, 2004, at 3:20.  3. Dr. Myna Hampton.  The patient is to call his office for a three-week followup     appointment after discharge.      [name], [place]                      [name], M.D.    AY/MEDQ  D:  01/27/2004  T:  01/28/2004  Job:  [id number]   cc:   [name].  [name], M.D. [address] [facility]   [place], [place] [address]  Fax: [phone]

## 2011-01-14 ENCOUNTER — Other Ambulatory Visit: Payer: Self-pay | Admitting: Hematology & Oncology

## 2011-01-14 ENCOUNTER — Encounter (HOSPITAL_BASED_OUTPATIENT_CLINIC_OR_DEPARTMENT_OTHER): Payer: PRIVATE HEALTH INSURANCE | Admitting: Hematology & Oncology

## 2011-01-14 DIAGNOSIS — Z85118 Personal history of other malignant neoplasm of bronchus and lung: Secondary | ICD-10-CM

## 2011-01-14 DIAGNOSIS — D509 Iron deficiency anemia, unspecified: Secondary | ICD-10-CM

## 2011-01-14 LAB — CBC WITH DIFFERENTIAL (CANCER CENTER ONLY)
BASO#: 0.1 10*3/uL (ref 0.0–0.2)
BASO%: 0.9 % (ref 0.0–2.0)
EOS%: 1.8 % (ref 0.0–7.0)
Eosinophils Absolute: 0.1 10*3/uL (ref 0.0–0.5)
HCT: 37.7 % (ref 34.8–46.6)
HGB: 13 g/dL (ref 11.6–15.9)
LYMPH#: 1.2 10*3/uL (ref 0.9–3.3)
LYMPH%: 21.7 % (ref 14.0–48.0)
MCH: 31.7 pg (ref 26.0–34.0)
MCHC: 34.5 g/dL (ref 32.0–36.0)
MCV: 92 fL (ref 81–101)
MONO#: 0.8 10*3/uL (ref 0.1–0.9)
MONO%: 14.4 % — ABNORMAL HIGH (ref 0.0–13.0)
NEUT#: 3.5 10*3/uL (ref 1.5–6.5)
NEUT%: 61.2 % (ref 39.6–80.0)
Platelets: 250 10*3/uL (ref 145–400)
RBC: 4.1 10*6/uL (ref 3.70–5.32)
RDW: 13.3 % (ref 11.1–15.7)
WBC: 5.7 10*3/uL (ref 3.9–10.0)

## 2011-01-14 LAB — CHCC SATELLITE - SMEAR

## 2011-01-19 LAB — RETICULOCYTES (CHCC): Retic Ct Pct: 1.6 % (ref 0.4–2.3)

## 2011-01-19 LAB — IRON AND TIBC
%SAT: 9 % — ABNORMAL LOW (ref 20–55)
Iron: 40 ug/dL — ABNORMAL LOW (ref 42–145)
UIBC: 388 ug/dL

## 2011-01-19 LAB — COMPREHENSIVE METABOLIC PANEL
Alkaline Phosphatase: 70 U/L (ref 39–117)
BUN: 10 mg/dL (ref 6–23)
CO2: 25 mEq/L (ref 19–32)
Creatinine, Ser: 0.64 mg/dL (ref 0.50–1.10)
Glucose, Bld: 89 mg/dL (ref 70–99)
Sodium: 138 mEq/L (ref 135–145)
Total Bilirubin: 0.4 mg/dL (ref 0.3–1.2)
Total Protein: 6.8 g/dL (ref 6.0–8.3)

## 2011-01-19 LAB — FERRITIN: Ferritin: 22 ng/mL (ref 10–291)

## 2011-01-19 LAB — VITAMIN D 25 HYDROXY (VIT D DEFICIENCY, FRACTURES): Vit D, 25-Hydroxy: 22 ng/mL — ABNORMAL LOW (ref 30–89)

## 2011-01-27 ENCOUNTER — Encounter (HOSPITAL_BASED_OUTPATIENT_CLINIC_OR_DEPARTMENT_OTHER): Payer: PRIVATE HEALTH INSURANCE | Admitting: Hematology & Oncology

## 2011-01-27 DIAGNOSIS — Z85118 Personal history of other malignant neoplasm of bronchus and lung: Secondary | ICD-10-CM

## 2011-01-27 DIAGNOSIS — D509 Iron deficiency anemia, unspecified: Secondary | ICD-10-CM

## 2011-04-22 ENCOUNTER — Other Ambulatory Visit: Payer: Self-pay | Admitting: Internal Medicine

## 2011-04-22 DIAGNOSIS — Z1231 Encounter for screening mammogram for malignant neoplasm of breast: Secondary | ICD-10-CM

## 2011-04-28 ENCOUNTER — Ambulatory Visit
Admission: RE | Admit: 2011-04-28 | Discharge: 2011-04-28 | Disposition: A | Discharge status: Home / Self Care | Payer: PRIVATE HEALTH INSURANCE | Source: Ambulatory Visit | Attending: Internal Medicine | Admitting: Internal Medicine

## 2011-04-28 DIAGNOSIS — Z1231 Encounter for screening mammogram for malignant neoplasm of breast: Secondary | ICD-10-CM

## 2012-01-17 ENCOUNTER — Encounter: Payer: PRIVATE HEALTH INSURANCE | Admitting: Medical

## 2012-01-17 ENCOUNTER — Other Ambulatory Visit: Payer: PRIVATE HEALTH INSURANCE | Admitting: Lab

## 2012-01-18 NOTE — Progress Notes (Signed)
This encounter was created in error - please disregard.

## 2012-01-20 ENCOUNTER — Telehealth: Payer: Self-pay | Admitting: Hematology & Oncology

## 2012-01-20 NOTE — Telephone Encounter (Signed)
Left pt message to call if she would like to reschedule missed appointment

## 2012-04-20 ENCOUNTER — Other Ambulatory Visit (HOSPITAL_COMMUNITY)
Admission: RE | Admit: 2012-04-20 | Discharge: 2012-04-20 | Disposition: A | Discharge status: Home / Self Care | Payer: PRIVATE HEALTH INSURANCE | Source: Ambulatory Visit | Attending: Obstetrics and Gynecology | Admitting: Obstetrics and Gynecology

## 2012-04-20 ENCOUNTER — Other Ambulatory Visit: Payer: Self-pay | Admitting: Nurse Practitioner

## 2012-04-20 DIAGNOSIS — Z1151 Encounter for screening for human papillomavirus (HPV): Secondary | ICD-10-CM | POA: Insufficient documentation

## 2012-04-20 DIAGNOSIS — Z01419 Encounter for gynecological examination (general) (routine) without abnormal findings: Secondary | ICD-10-CM | POA: Insufficient documentation

## 2012-10-16 ENCOUNTER — Other Ambulatory Visit: Payer: Self-pay | Admitting: Gastroenterology

## 2012-11-10 ENCOUNTER — Encounter (HOSPITAL_COMMUNITY): Payer: Self-pay | Admitting: *Deleted

## 2012-11-15 ENCOUNTER — Encounter (HOSPITAL_COMMUNITY): Payer: Self-pay | Admitting: Pharmacy Technician

## 2012-11-28 ENCOUNTER — Ambulatory Visit (HOSPITAL_COMMUNITY): Payer: BC Managed Care – PPO | Admitting: Anesthesiology

## 2012-11-28 ENCOUNTER — Ambulatory Visit (HOSPITAL_COMMUNITY)
Admission: RE | Admit: 2012-11-28 | Discharge: 2012-11-28 | Disposition: A | Discharge status: Home / Self Care | Payer: BC Managed Care – PPO | Source: Ambulatory Visit | Attending: Gastroenterology | Admitting: Gastroenterology

## 2012-11-28 ENCOUNTER — Encounter (HOSPITAL_COMMUNITY): Payer: Self-pay | Admitting: *Deleted

## 2012-11-28 ENCOUNTER — Encounter (HOSPITAL_COMMUNITY)
Admission: RE | Disposition: A | Discharge status: Home / Self Care | Payer: Self-pay | Source: Ambulatory Visit | Attending: Gastroenterology

## 2012-11-28 ENCOUNTER — Encounter (HOSPITAL_COMMUNITY): Payer: Self-pay | Admitting: Anesthesiology

## 2012-11-28 DIAGNOSIS — J4489 Other specified chronic obstructive pulmonary disease: Secondary | ICD-10-CM | POA: Insufficient documentation

## 2012-11-28 DIAGNOSIS — J449 Chronic obstructive pulmonary disease, unspecified: Secondary | ICD-10-CM | POA: Insufficient documentation

## 2012-11-28 DIAGNOSIS — M810 Age-related osteoporosis without current pathological fracture: Secondary | ICD-10-CM | POA: Insufficient documentation

## 2012-11-28 DIAGNOSIS — K5730 Diverticulosis of large intestine without perforation or abscess without bleeding: Secondary | ICD-10-CM | POA: Insufficient documentation

## 2012-11-28 DIAGNOSIS — K559 Vascular disorder of intestine, unspecified: Secondary | ICD-10-CM | POA: Insufficient documentation

## 2012-11-28 DIAGNOSIS — I10 Essential (primary) hypertension: Secondary | ICD-10-CM | POA: Insufficient documentation

## 2012-11-28 DIAGNOSIS — D126 Benign neoplasm of colon, unspecified: Secondary | ICD-10-CM | POA: Insufficient documentation

## 2012-11-28 DIAGNOSIS — Z85118 Personal history of other malignant neoplasm of bronchus and lung: Secondary | ICD-10-CM | POA: Insufficient documentation

## 2012-11-28 DIAGNOSIS — K648 Other hemorrhoids: Secondary | ICD-10-CM | POA: Insufficient documentation

## 2012-11-28 DIAGNOSIS — Z79899 Other long term (current) drug therapy: Secondary | ICD-10-CM | POA: Insufficient documentation

## 2012-11-28 HISTORY — DX: Adverse effect of unspecified anesthetic, initial encounter: T41.45XA

## 2012-11-28 HISTORY — DX: Tremor, unspecified: R25.1

## 2012-11-28 HISTORY — DX: Essential (primary) hypertension: I10

## 2012-11-28 HISTORY — DX: Personal history of other medical treatment: Z92.89

## 2012-11-28 HISTORY — DX: Anemia, unspecified: D64.9

## 2012-11-28 HISTORY — PX: COLONOSCOPY WITH PROPOFOL: SHX5780

## 2012-11-28 HISTORY — DX: Malignant (primary) neoplasm, unspecified: C80.1

## 2012-11-28 LAB — CBC
HCT: 40.9 % (ref 36.0–46.0)
Hemoglobin: 13.8 g/dL (ref 12.0–15.0)
MCH: 32.9 pg (ref 26.0–34.0)
MCHC: 33.7 g/dL (ref 30.0–36.0)
RBC: 4.2 MIL/uL (ref 3.87–5.11)

## 2012-11-28 SURGERY — COLONOSCOPY WITH PROPOFOL
Anesthesia: Monitor Anesthesia Care

## 2012-11-28 MED ORDER — PROPOFOL INFUSION 10 MG/ML OPTIME
INTRAVENOUS | Status: DC | PRN
  Administered 2012-11-28: 140 ug/kg/min via INTRAVENOUS

## 2012-11-28 MED ORDER — LACTATED RINGERS IV SOLN
INTRAVENOUS | Status: DC | PRN
  Administered 2012-11-28: 10:00:00 via INTRAVENOUS

## 2012-11-28 MED ORDER — KETAMINE HCL 10 MG/ML IJ SOLN
INTRAMUSCULAR | Status: DC | PRN
  Administered 2012-11-28: 20 mg via INTRAVENOUS

## 2012-11-28 MED ORDER — PROPOFOL 10 MG/ML IV EMUL
INTRAVENOUS | Status: DC | PRN
  Administered 2012-11-28 (×2): 40 mg via INTRAVENOUS

## 2012-11-28 MED ORDER — SODIUM CHLORIDE 0.9 % IV SOLN: INTRAVENOUS | Status: DC

## 2012-11-28 SURGICAL SUPPLY — 22 items

## 2012-11-28 NOTE — Preoperative (Signed)
Beta Blockers   Reason not to administer Beta Blockers:Not Applicable 

## 2012-11-28 NOTE — Anesthesia Postprocedure Evaluation (Signed)
  Anesthesia Post-op Note  Patient: [name]  Procedure(s) Performed: Procedure(s) (LRB): COLONOSCOPY WITH PROPOFOL (N/A)  Patient Location: PACU  Anesthesia Type: MAC  Level of Consciousness: awake and alert   Airway and Oxygen Therapy: Patient Spontanous Breathing  Post-op Pain: mild  Post-op Assessment: Post-op Vital signs reviewed, Patient's Cardiovascular Status Stable, Respiratory Function Stable, Patent Airway and No signs of Nausea or vomiting  Last Vitals:  Filed Vitals:   11/28/12 1110  BP: 166/80  Pulse:   Temp:   Resp: 18    Post-op Vital Signs: stable   Complications: No apparent anesthesia complications

## 2012-11-28 NOTE — Anesthesia Preprocedure Evaluation (Addendum)
Anesthesia Evaluation  Patient identified by MRN, date of birth, ID band Patient awake  General Assessment Comment:Slow to awaken from anesthesia.  Reviewed: Allergy & Precautions, H&P , NPO status , Patient's Chart, lab work & pertinent test results  Airway Mallampati: II TM Distance: >3 FB Neck ROM: Full    Dental no notable dental hx.    Pulmonary former smoker,  S/P right lung lobectomy. breath sounds clear to auscultation  Pulmonary exam normal       Cardiovascular Exercise Tolerance: Good hypertension, Pt. on home beta blockers Rhythm:Regular Rate:Normal  ECG: normal.   Neuro/Psych negative neurological ROS  negative psych ROS   GI/Hepatic negative GI ROS, Neg liver ROS,   Endo/Other  negative endocrine ROS  Renal/GU negative Renal ROS  negative genitourinary   Musculoskeletal negative musculoskeletal ROS (+)   Abdominal   Peds negative pediatric ROS (+)  Hematology negative hematology ROS (+)   Anesthesia Other Findings   Reproductive/Obstetrics negative OB ROS                          Anesthesia Physical Anesthesia Plan  ASA: II  Anesthesia Plan: MAC   Post-op Pain Management:    Induction: Intravenous  Airway Management Planned:   Additional Equipment:   Intra-op Plan:   Post-operative Plan:   Informed Consent: I have reviewed the patients History and Physical, chart, labs and discussed the procedure including the risks, benefits and alternatives for the proposed anesthesia with the patient or authorized representative who has indicated his/her understanding and acceptance.   Dental advisory given  Plan Discussed with: CRNA  Anesthesia Plan Comments:         Anesthesia Quick Evaluation

## 2012-11-28 NOTE — Op Note (Signed)
Procedure: Surveillance colonoscopy. Small adenomatous rectal polyp removed colonoscopically in 2008  Endoscopist: Danise Edge  Premedication: Propofol administered by anesthesia  Procedure: The patient was placed in the left lateral decubitus position. Anal inspection revealed large prolapsed internal hemorrhoids. Digital rectal examination was normal. The Pentax pediatric colonoscope was introduced into the rectum and advanced to the cecum. A normal-appearing ileocecal valve and appendiceal orifice were identified. Colonic preparation in the left colon was good. Colonic preparation in the right colon was fair at best. The patient vomited her morning dose of Suprep.  Rectum. Normal. Retroflex view of the distal rectum normal. Large prolapsed internal hemorrhoids were present.  Sigmoid colon. Left colonic diverticulosis.  Descending colon. From the distal descending colon a 5 mm sessile polyp was removed with the cold snare.  Splenic flexure. Mucosa in the splenic flexure appears inflamed and has a lumpy-bumpy appearance consistent with localized ischemic colitis. Biopsies were performed.  Transverse colon. From the mid transverse colon, a 6 mm sessile polyp was removed with the hot snare and a 5 mm sessile polyp was removed with the cold snare.  Hepatic flexure. Normal.  Ascending colon. Normal.  Cecum and ileocecal valve. Normal.  Assessment:  #1. Large, prolapsed internal hemorrhoids  #2. A 5 mm sessile polyp was removed from the descending colon, a 6 mm sessile polyp was removed from the transverse colon, and a 5 mm sessile polyp was removed from the transverse colon. All polyps were submitted in one bottle for pathological evaluation.  #3. A localized area of inflamed, lumpy-bumpy appearing mucosa was present in the splenic flexure-proximal descending colon consistent with localized ischemic colitis. Biopsies were performed.  #4. Sigmoid colon  diverticulosis.  Recommendations: Followup colonoscopy will be determined after reviewing the pathology. Recommend referral to [facility] surgery to manage prolapsed internal hemorrhoids.

## 2012-11-28 NOTE — Transfer of Care (Signed)
Immediate Anesthesia Transfer of Care Note  Patient: [name]  Procedure(s) Performed: Procedure(s): COLONOSCOPY WITH PROPOFOL (N/A)  Patient Location: PACU  Anesthesia Type:MAC  Level of Consciousness: awake, alert  and oriented  Airway & Oxygen Therapy: Patient Spontanous Breathing and Patient connected to face mask oxygen  Post-op Assessment: Report given to PACU RN, Post -op Vital signs reviewed and stable and Patient moving all extremities X 4  Post vital signs: Reviewed and stable  Complications: No apparent anesthesia complications

## 2012-11-28 NOTE — H&P (Signed)
  Problem: Surveillance colonoscopy  History: The patient is a 62 year old female born Dec 16, 1950. On 01/27/2007, the patient underwent a baseline screening colonoscopy with removal of a 5 mm adenomatous rectal polyp. She is scheduled to undergo a surveillance colonoscopy today.  Chronic medications: Mysoline. Atenolol. Buspirone.  Past medical and surgical history: Chronic anxiety. Non-small cell lung cancer diagnosed in 2005. Mild chronic obstructive pulmonary disease. Osteoarthritis. Scoliosis of the spine. Irritable bowel syndrome. Osteoporosis. Essential tremor. Right lung lobectomy in 2005.  Habits: The patient quit smoking cigarettes in 2001. She has a 60-pack-year history of cigarette smoking. She consumes alcohol in moderation.  Exam: The patient is alert and lying comfortably on the endoscopy stretcher. Lungs are clear to auscultation. A previous right lung lobectomy. Cardiac exam reveals a regular rhythm. Abdomen is soft, flat, and nontender to palpation in all quadrants.   Plan: Proceed with surveillance colonoscopy using propofol sedation as scheduled.

## 2012-11-29 ENCOUNTER — Encounter (HOSPITAL_COMMUNITY): Payer: Self-pay | Admitting: Gastroenterology

## 2012-12-25 ENCOUNTER — Ambulatory Visit (INDEPENDENT_AMBULATORY_CARE_PROVIDER_SITE_OTHER): Payer: Self-pay | Admitting: Surgery

## 2013-01-09 ENCOUNTER — Ambulatory Visit (INDEPENDENT_AMBULATORY_CARE_PROVIDER_SITE_OTHER): Payer: BC Managed Care – PPO | Admitting: Surgery

## 2013-01-29 ENCOUNTER — Encounter (INDEPENDENT_AMBULATORY_CARE_PROVIDER_SITE_OTHER): Payer: Self-pay | Admitting: Surgery

## 2013-01-29 ENCOUNTER — Ambulatory Visit (INDEPENDENT_AMBULATORY_CARE_PROVIDER_SITE_OTHER): Payer: BC Managed Care – PPO | Admitting: Surgery

## 2013-01-29 VITALS — BP 130/90 | HR 64 | Temp 97.9°F | Resp 15 | Ht 64.5 in | Wt 139.8 lb

## 2013-01-29 DIAGNOSIS — K648 Other hemorrhoids: Secondary | ICD-10-CM | POA: Insufficient documentation

## 2013-01-29 DIAGNOSIS — Z860100 Personal history of colon polyps, unspecified: Secondary | ICD-10-CM | POA: Insufficient documentation

## 2013-01-29 MED ORDER — HYDROCORTISONE ACE-PRAMOXINE 2.5-1 % RE CREA: TOPICAL_CREAM | Freq: Four times a day (QID) | RECTAL | Status: DC

## 2013-01-29 NOTE — Progress Notes (Signed)
Subjective:     Patient ID: [name], female   DOB: 1950-07-30, 62 y.o.   MRN: [id number]  HPI  [name]  1951-01-02 [id number]  Patient Care Team: Georgann Housekeeper, MD as PCP - General (Internal Medicine) Charolett Bumpers, MD as Consulting Physician (Gastroenterology)  This patient is a 61 y.o.female who presents today for surgical evaluation at the request of Dr. Laural Benes.   Reason for visit: Painful bleeding prolapsed hemorrhoids  Pleasant woman.  Has intermittent constipation and diarrhea.  Takes FiberCon.  Occasionally needs to use Correctol laxative.  Has struggled with hemorrhoids for over 25 years.  Has not had any procedures done.  Had a colonoscopy done a few months ago.  A few polyps were removed.  Significant hemorrhoids seen.  It was recommended that she see surgery.  She comes in now two months later.  She is hesitant to have anything done.  No personal nor family history of GI/colon cancer, inflammatory bowel disease, irritable bowel syndrome, allergy such as Celiac Sprue, dietary/dairy problems, colitis, ulcers nor gastritis.  No recent sick contacts/gastroenteritis.  No travel outside the country.  No changes in diet.  She normally can walk 2 miles a day without difficulty.    Patient Active Problem List   Diagnosis Date Noted  . Personal history of colonic polyps 01/29/2013  . Internal prolapsed hemorrhoids 01/29/2013    Past Medical History  Diagnosis Date  . Hypertension   . Cancer 2007    lung cancer, right, chemo done  . Anemia   . History of blood transfusion 2010  . Occasional tremors   . Complication of anesthesia 2007    slow to awaken    Past Surgical History  Procedure Laterality Date  . Right lung lobectomy  2007  . Colonoscopy with propofol N/A 11/28/2012    Procedure: COLONOSCOPY WITH PROPOFOL;  Surgeon: Charolett Bumpers, MD;  Location: [facility];  Service: Endoscopy;  Laterality: N/A;    History   Social History  . Marital  Status: Married    Spouse Name: N/A    Number of Children: N/A  . Years of Education: N/A   Occupational History  . Not on file.   Social History Main Topics  . Smoking status: Former Smoker -- 3.00 packs/day for 25 years    Types: Cigarettes    Quit date: 07/13/2003  . Smokeless tobacco: Never Used  . Alcohol Use: 4.2 oz/week    7 Glasses of wine per week     Comment: 1 glass winde daily  . Drug Use: No  . Sexually Active: Not on file   Other Topics Concern  . Not on file   Social History Narrative  . No narrative on file    Family History  Problem Relation Age of Onset  . Cancer Mother     bladder  . Heart disease Father   . Diabetes Father     Current Outpatient Prescriptions  Medication Sig Dispense Refill  . atenolol (TENORMIN) 25 MG tablet Take 25 mg by mouth every morning.      . calcium carbonate 200 MG capsule Take 250 mg by mouth daily.      . cholecalciferol (VITAMIN D) 1000 UNITS tablet Take 1,000 Units by mouth daily.      . Fluticasone-Salmeterol (ADVAIR) 250-50 MCG/DOSE AEPB Inhale 1 puff into the lungs every 12 (twelve) hours as needed (for breathing).      . Multiple Vitamin (MULTIVITAMIN) tablet Take 1 tablet by  mouth daily.      . primidone (MYSOLINE) 50 MG tablet Take 25 mg by mouth every morning.       . psyllium (REGULOID) 0.52 G capsule Take 0.52 g by mouth daily. Fiber pill qd      . hydrocortisone-pramoxine (ANALPRAM-HC) 2.5-1 % rectal cream Place rectally 4 (four) times daily. For irritated and painful hemorrhoids  15 g  2   No current facility-administered medications for this visit.     No Known Allergies  BP 130/90  Pulse 64  Temp(Src) 97.9 F (36.6 C) (Temporal)  Resp 15  Ht 5' 4.5" (1.638 m)  Wt 139 lb 12.8 oz (63.413 kg)  BMI 23.63 kg/m2  No results found.   Review of Systems  Constitutional: Negative for fever, chills, diaphoresis, appetite change and fatigue.  HENT: Negative for ear pain, sore throat, trouble  swallowing, neck pain and ear discharge.   Eyes: Negative for photophobia, discharge and visual disturbance.  Respiratory: Negative for cough, choking, chest tightness and shortness of breath.   Cardiovascular: Negative for chest pain and palpitations.  Gastrointestinal: Negative for nausea, vomiting, abdominal pain, diarrhea, constipation, anal bleeding and rectal pain.  Endocrine: Negative for cold intolerance and heat intolerance.  Genitourinary: Negative for dysuria, frequency and difficulty urinating.  Musculoskeletal: Negative for myalgias and gait problem.  Skin: Negative for color change, pallor and rash.  Allergic/Immunologic: Negative for environmental allergies, food allergies and immunocompromised state.  Neurological: Negative for dizziness, speech difficulty, weakness and numbness.  Hematological: Negative for adenopathy.  Psychiatric/Behavioral: Negative for confusion and agitation. The patient is not nervous/anxious.        Objective:   Physical Exam  Constitutional: She is oriented to person, place, and time. She appears well-developed and well-nourished. No distress.  HENT:  Head: Normocephalic.  Mouth/Throat: Oropharynx is clear and moist. No oropharyngeal exudate.  Eyes: Conjunctivae and EOM are normal. Pupils are equal, round, and reactive to light. No scleral icterus.  Neck: Normal range of motion. Neck supple. No tracheal deviation present.  Cardiovascular: Normal rate, regular rhythm and intact distal pulses.   Pulmonary/Chest: Effort normal and breath sounds normal. No stridor. No respiratory distress. She exhibits no tenderness.  Abdominal: Soft. She exhibits no distension and no mass. There is no tenderness. Hernia confirmed negative in the right inguinal area and confirmed negative in the left inguinal area.  Genitourinary: No vaginal discharge found.  Exam done with assistance of female Medical Assistant in the room.  Perianal skin clean with good hygiene.   No pruritis.  Marland Kitchen  No pilonidal disease.  No fissure.  No abscess/fistula.  Normal sphincter tone.    Very large prolapsed internal hemorrhoids especially on right side.  Very sensitive.  No endoscopy done as a result given the recent colonoscopy   Musculoskeletal: Normal range of motion. She exhibits no tenderness.       Right elbow: She exhibits normal range of motion.       Left elbow: She exhibits normal range of motion.       Right wrist: She exhibits normal range of motion.       Left wrist: She exhibits normal range of motion.       Right hand: Normal strength noted.       Left hand: Normal strength noted.  Lymphadenopathy:       Head (right side): No posterior auricular adenopathy present.       Head (left side): No posterior auricular adenopathy present.    She  has no cervical adenopathy.    She has no axillary adenopathy.       Right: No inguinal adenopathy present.       Left: No inguinal adenopathy present.  Neurological: She is alert and oriented to person, place, and time. She displays tremor. She displays no atrophy. No cranial nerve deficit or sensory deficit. She exhibits normal muscle tone. She displays no seizure activity. Coordination normal. GCS eye subscore is 4. GCS verbal subscore is 5. GCS motor subscore is 6.  Resting facial tremor.  A little mild tremulous voice  Skin: Skin is warm and dry. No rash noted. She is not diaphoretic. No erythema.  Psychiatric: She has a normal mood and affect. Her behavior is normal. Judgment and thought content normal.       Assessment:     Large prolapsed hemorrhoids with bleeding and pain.  Too sensitive to attempt banding/injections.     Plan:     I think these are too far along to try conservative measures.  I think she requires hemorrhoidal ligation and pexy with the [facility] system.  Probably will need some external hemorrhoidectomy as well.  Try not to be overly aggressive.  I discussed the procedure with her:  The anatomy &  physiology of the anorectal region was discussed.  The pathophysiology of hemorrhoids and differential diagnosis was discussed.  Natural history risks without surgery was discussed.   I stressed the importance of a bowel regimen to have daily soft bowel movements to minimize progression of disease.  Interventions such as sclerotherapy & banding were discussed.  The patient's symptoms are not adequately controlled by medicines and other non-operative treatments.  I feel the risks & problems of no surgery outweigh the operative risks; therefore, I recommended surgery to treat the hemorrhoids by ligation, pexy, and possible resection.  Risks such as bleeding, infection, need for further treatment, heart attack, death, and other risks were discussed.   I noted a good likelihood this will help address the problem.  Goals of post-operative recovery were discussed as well.  Possibility that this will not correct all symptoms was explained.  Post-operative pain, bleeding, constipation, and other problems after surgery were discussed.  We will work to minimize complications.   Educational handouts further explaining the pathology, treatment options, and bowel regimen were given as well.  Questions were answered.  The patient expresses understanding & wishes to think about surgery.  At this point she does not want anything done.  She wishes to try improving the bowel regimen and try topical Analpram first.  I did give her handouts for this.  I noted I trying to avoid going to surgery but her anatomy is too significant.  She understands that seems afraid of trying surgery.  She was discussed with her husband person will let me know

## 2013-01-29 NOTE — Patient Instructions (Addendum)
See the Handout(s) we gave you.  Consider surgery.  Please call our office at [phone] if you wish to schedule surgery or if you have further questions / concerns.    HEMORRHOIDS  The rectum is the last foot of your colon, and it naturally stretches to hold stool.  Hemorrhoidal piles are natural clusters of blood vessels that help the rectum and anal canal stretch to hold stool and allow bowel movements to eliminate feces.   Hemorrhoids are abnormally swollen blood vessels in the rectum.  Too much pressure in the rectum causes hemorrhoids by forcing blood to stretch and bulge the walls of the veins, sometimes even rupturing them.  Hemorrhoids can become like varicose veins you might see on a person's legs.  Most people will develop a flare of hemorrhoids in their lifetime.  When bulging hemorrhoidal veins are irritated, they can swell, burn, itch, cause pain, and bleed.  Most flares will calm down gradually own within a few weeks.  However, once hemorrhoids are created, they are difficult to get rid of completely and tend to flare more easily than the first flare.   Fortunately, good habits and simple medical treatment usually control hemorrhoids well, and surgery is needed only in severe cases. Types of Hemorrhoids:  Internal hemorrhoids usually don't initially hurt or itch; they are deep inside the rectum and usually have no sensation. If they begin to push out (prolapse), pain and burning can occur.  However, internal hemorrhoids can bleed.  Anal bleeding should not be ignored since bleeding could come from a dangerous source like colorectal cancer, so persistent rectal bleeding should be investigated by a doctor, sometimes with a colonoscopy.  External hemorrhoids cause most of the symptoms - pain, burning, and itching. Nonirritated hemorrhoids can look like small skin tags coming out of the anus.   Thrombosed hemorrhoids can form when a hemorrhoid blood vessel bursts and causes the hemorrhoid  to suddenly swell.  A purple blood clot can form in it and become an excruciatingly painful lump at the anus. Because of these unpleasant symptoms, immediate incision and drainage by a surgeon at an office visit can provide much relief of the pain.    PREVENTION Avoiding the most frequent causes listed below will prevent most cases of hemorrhoids: Constipation Hard stools Diarrhea  Constant sitting  Straining with bowel movements Sitting on the toilet for a long time  Severe coughing  episodes Pregnancy / Childbirth  Heavy Lifting  Sometimes avoiding the above triggers is difficult:  How can you avoid sitting all day if you have a seated job? Also, we try to avoid coughing and diarrhea, but sometimes it's beyond your control.  Still, there are some practical hints to help: Keep the anal and genital area clean.  Moistened tissues such as flushable wet wipes are less irritating than toilet paper.  Using irrigating showers or bottle irrigation washing gently cleans this sensitive area.   Avoid dry toilet paper when cleaning after bowel movements.  . Keep the anal and genital area dry.  Lightly pat the rectal area dry.  Avoid rubbing.  Talcum or baby powders can help GET YOUR STOOLS SOFT.   This is the most important way to prevent irritated hemorrhoids.  Hard stools are like sandpaper to the anorectal canal and will cause more problems.  The goal: ONE SOFT BOWEL MOVEMENT A DAY!  BMs from every other day to 3 times a day is a tolerable range Treat coughing, diarrhea and constipation early since   irritated hemorrhoids may soon follow.  If your main job activity is seated, always stand or walk during your breaks. Make it a point to stand and walk at least 5 minutes every hour and try to shift frequently in your chair to avoid direct rectal pressure.  Always exhale as you strain or lift. Don't hold your breath.  Do not delay or try to prevent a bowel movement when the urge is present. Exercise regularly  (walking or jogging 60 minutes a day) to stimulate the bowels to move. No reading or other activity while on the toilet. If bowel movements take longer than 5 minutes, you are too constipated. AVOID CONSTIPATION Drink plenty of liquids (1 1/2 to 2 quarts of water and other fluids a day unless fluid restricted for another medical condition). Liquids that contain caffeine (coffee a, tea, soft drinks) can be dehydrating and should be avoided until constipation is controlled. Consider minimizing milk, as dairy products may be constipating. Eat plenty of fiber (30g a day ideal, more if needed).  Fiber is the undigested part of plant food that passes into the colon, acting as "natures broom" to encourage bowel motility and movement.  Fiber can absorb and hold large amounts of water. This results in a larger, bulkier stool, which is soft and easier to pass.  Eating foods high in fiber - 12 servings - such as  Vegetables: Root (potatoes, carrots, turnips), Leafy green (lettuce, salad greens, celery, spinach), High residue (cabbage, broccoli, etc.) Fruit: Fresh, Dried (prunes, apricots, cherries), Stewed (applesauce)  Whole grain breads, pasta, whole wheat Bran cereals, muffins, etc. Consider adding supplemental bulking fiber which retains large volumes of water: Psyllium ground seeds --available as Metamucil, Konsyl, Effersyllium, Per Diem Fiber, or the less expensive generic forms.  Citrucel  (methylcellulose wood fiber) . FiberCon (Polycarbophil) Polyethylene Glycol - and "artificial" fiber commonly called Miralax or Glycolax.  It is helpful for people with gassy or bloated feelings with regular fiber Flax Seed - a less gassy natural fiber  Laxatives can be useful for a short period if constipation is severe Osmotics (Milk of Magnesia, Fleets Phospho-Soda, Magnesium Citrate)  Stimulants (Senokot,   Castor Oil,  Dulcolax, Ex-Lax)    Laxatives are not a good long-term solution as it can stress the bowels  and cause too much mineral loss and dehydration.   Avoid taking laxatives for more than 7 days in a row.  AVOID DIARRHEA Switch to liquids and simpler foods for a few days to avoid stressing your intestines further. Avoid dairy products (especially milk & ice cream) for a short time.  The intestines often can lose the ability to digest lactose when stressed. Avoid foods that cause gassiness or bloating.  Typical foods include beans and other legumes, cabbage, broccoli, and dairy foods.  Every person has some sensitivity to other foods, so listen to your body and avoid those foods that trigger problems for you. Adding fiber (Citrucel, Metamucil, FiberCon, Flax seed, Miralax) gradually can help thicken stools by absorbing excess fluid and retrain the intestines to act more normally.  Slowly increase the dose over a few weeks.  Too much fiber too soon can backfire and cause cramping & bloating. Probiotics (such as active yogurt, Align, etc) may help repopulate the intestines and colon with normal bacteria and calm down a sensitive digestive tract.  Most studies show it to be of mild help, though, and such products can be costly. Medicines: Bismuth subsalicylate (ex. Kayopectate, Pepto Bismol) every 30 minutes for up   to 6 doses can help control diarrhea.  Avoid if pregnant. Loperamide (Immodium) can slow down diarrhea.  Start with two tablets (4mg total) first and then try one tablet every 6 hours.  Avoid if you are having fevers or severe pain.  If you are not better or start feeling worse, stop all medicines and call your doctor for advice Call your doctor if you are getting worse or not better.  Sometimes further testing (cultures, endoscopy, X-ray studies, bloodwork, etc) may be needed to help diagnose and treat the cause of the diarrhea. TREATMENT OF HEMORRHOID FLARE If these preventive measures fail, you must take action right away! Hemorrhoids are one condition that can be mild in the morning and  become intolerable by nightfall. Most hemorrhoidal flares take several weeks to calm down.  These suggestions can help: Warm soaks.  This helps more than any topical medication.  Use up to 8 times a day.  Usually sitz baths or sitting in a warm bathtub helps.  Sitting on moist warm towels are helpful.  Switching to ice packs/cool compresses can be helpful Normalize your bowels.  Extremes of diarrhea or constipation will make hemorrhoids worse.  One soft bowel movement a day is the goal.  Fiber can help get your bowels regular Wet wipes instead of toilet paper Pain control with a NSAID such as ibuprofen (Advil) or naproxen (Aleve) or acetaminophen (Tylenol) around the clock.  Narcotics are constipating and should be minimized if possible Topical creams contain steroids (bydrocortisone) or local anesthetic (xylocaine) can help make pain and itching more tolerable.   EVALUATION If hemorrhoids are still causing problems, you could benefit by an evaluation by a surgeon.  The surgeon will obtain a history and examine you.  If hemorrhoids are diagnosed, some therapies can be offered in the office, usually with an anoscope into the less sensitive area of the rectum: -injection of hemorrhoids (sclerotherapy) can scar the blood vessels of the swollen/enlarged hemorrhoids to help shrink them down to a more normal size -rubber banding of the enlarged hemorrhoids to help shrink them down to a more normal size -drainage of the blood clot causing a thrombosed hemorrhoid,  to relieve the severe pain   While 90% of the time such problems from hemorrhoids can be managed without preceding to surgery, sometimes the hemorrhoids require a operation to control the problem (uncontrolled bleeding, prolapse, pain, etc.).   This involves being placed under general anesthesia where the surgeon can confirm the diagnosis and remove, suture, or staple the hemorrhoid(s).  Your surgeon can help you treat the problem appropriately.     

## 2013-02-21 ENCOUNTER — Other Ambulatory Visit: Payer: Self-pay | Admitting: Internal Medicine

## 2013-02-21 DIAGNOSIS — Z1231 Encounter for screening mammogram for malignant neoplasm of breast: Secondary | ICD-10-CM

## 2013-03-15 ENCOUNTER — Ambulatory Visit: Payer: BC Managed Care – PPO

## 2013-04-09 ENCOUNTER — Ambulatory Visit: Payer: BC Managed Care – PPO

## 2013-04-24 ENCOUNTER — Ambulatory Visit
Admission: RE | Admit: 2013-04-24 | Discharge: 2013-04-24 | Disposition: A | Discharge status: Home / Self Care | Payer: BC Managed Care – PPO | Source: Ambulatory Visit | Attending: Internal Medicine | Admitting: Internal Medicine

## 2013-04-24 DIAGNOSIS — Z1231 Encounter for screening mammogram for malignant neoplasm of breast: Secondary | ICD-10-CM

## 2013-04-30 ENCOUNTER — Other Ambulatory Visit: Payer: Self-pay | Admitting: Internal Medicine

## 2013-04-30 DIAGNOSIS — R928 Other abnormal and inconclusive findings on diagnostic imaging of breast: Secondary | ICD-10-CM

## 2013-05-14 ENCOUNTER — Other Ambulatory Visit: Payer: BC Managed Care – PPO

## 2013-05-25 ENCOUNTER — Ambulatory Visit
Admission: RE | Admit: 2013-05-25 | Discharge: 2013-05-25 | Disposition: A | Discharge status: Home / Self Care | Payer: BC Managed Care – PPO | Source: Ambulatory Visit | Attending: Internal Medicine | Admitting: Internal Medicine

## 2013-05-25 DIAGNOSIS — R928 Other abnormal and inconclusive findings on diagnostic imaging of breast: Secondary | ICD-10-CM

## 2013-08-21 ENCOUNTER — Ambulatory Visit
Admission: RE | Admit: 2013-08-21 | Discharge: 2013-08-21 | Disposition: A | Discharge status: Home / Self Care | Payer: BC Managed Care – PPO | Source: Ambulatory Visit | Attending: Internal Medicine | Admitting: Internal Medicine

## 2013-08-21 ENCOUNTER — Other Ambulatory Visit: Payer: Self-pay | Admitting: Internal Medicine

## 2013-08-21 DIAGNOSIS — C3490 Malignant neoplasm of unspecified part of unspecified bronchus or lung: Secondary | ICD-10-CM

## 2013-10-05 ENCOUNTER — Other Ambulatory Visit (INDEPENDENT_AMBULATORY_CARE_PROVIDER_SITE_OTHER): Payer: Self-pay | Admitting: Surgery

## 2014-03-28 ENCOUNTER — Ambulatory Visit
Admission: RE | Admit: 2014-03-28 | Discharge: 2014-03-28 | Disposition: A | Discharge status: Home / Self Care | Payer: BC Managed Care – PPO | Source: Ambulatory Visit | Attending: Internal Medicine | Admitting: Internal Medicine

## 2014-03-28 ENCOUNTER — Other Ambulatory Visit: Payer: Self-pay | Admitting: Internal Medicine

## 2014-03-28 DIAGNOSIS — R079 Chest pain, unspecified: Secondary | ICD-10-CM

## 2014-04-12 ENCOUNTER — Telehealth: Payer: Self-pay | Admitting: *Deleted

## 2014-04-12 NOTE — Telephone Encounter (Signed)
Patient cancelled new patient appointment for 04-15-2014. Dr Marchia Bond office notified

## 2014-04-15 ENCOUNTER — Ambulatory Visit: Payer: BC Managed Care – PPO | Admitting: Neurology

## 2014-07-16 ENCOUNTER — Other Ambulatory Visit: Payer: Self-pay

## 2014-07-16 DIAGNOSIS — Z1231 Encounter for screening mammogram for malignant neoplasm of breast: Secondary | ICD-10-CM

## 2014-07-26 ENCOUNTER — Ambulatory Visit: Payer: Self-pay

## 2015-07-15 ENCOUNTER — Ambulatory Visit
Admission: RE | Admit: 2015-07-15 | Discharge: 2015-07-15 | Disposition: A | Discharge status: Home / Self Care | Payer: PPO | Source: Ambulatory Visit | Attending: Internal Medicine | Admitting: Internal Medicine

## 2015-07-15 ENCOUNTER — Other Ambulatory Visit: Payer: Self-pay | Admitting: Internal Medicine

## 2015-07-15 DIAGNOSIS — C3490 Malignant neoplasm of unspecified part of unspecified bronchus or lung: Secondary | ICD-10-CM

## 2015-07-15 DIAGNOSIS — Z1389 Encounter for screening for other disorder: Secondary | ICD-10-CM | POA: Diagnosis not present

## 2015-07-15 DIAGNOSIS — R251 Tremor, unspecified: Secondary | ICD-10-CM | POA: Diagnosis not present

## 2015-07-15 DIAGNOSIS — Z0000 Encounter for general adult medical examination without abnormal findings: Secondary | ICD-10-CM | POA: Diagnosis not present

## 2015-07-15 DIAGNOSIS — F419 Anxiety disorder, unspecified: Secondary | ICD-10-CM | POA: Diagnosis not present

## 2015-07-15 DIAGNOSIS — E782 Mixed hyperlipidemia: Secondary | ICD-10-CM | POA: Diagnosis not present

## 2015-07-15 DIAGNOSIS — M810 Age-related osteoporosis without current pathological fracture: Secondary | ICD-10-CM | POA: Diagnosis not present

## 2015-07-15 DIAGNOSIS — F322 Major depressive disorder, single episode, severe without psychotic features: Secondary | ICD-10-CM | POA: Diagnosis not present

## 2015-07-15 DIAGNOSIS — Z23 Encounter for immunization: Secondary | ICD-10-CM | POA: Diagnosis not present

## 2015-07-15 DIAGNOSIS — Z1231 Encounter for screening mammogram for malignant neoplasm of breast: Secondary | ICD-10-CM

## 2015-07-15 DIAGNOSIS — R0602 Shortness of breath: Secondary | ICD-10-CM | POA: Diagnosis not present

## 2015-08-07 ENCOUNTER — Ambulatory Visit: Payer: Self-pay

## 2015-10-30 ENCOUNTER — Other Ambulatory Visit: Payer: Self-pay | Admitting: Internal Medicine

## 2015-10-30 DIAGNOSIS — R5381 Other malaise: Secondary | ICD-10-CM

## 2015-11-05 ENCOUNTER — Other Ambulatory Visit: Payer: Self-pay | Admitting: Internal Medicine

## 2015-11-05 DIAGNOSIS — M8580 Other specified disorders of bone density and structure, unspecified site: Secondary | ICD-10-CM

## 2015-12-19 ENCOUNTER — Ambulatory Visit: Payer: PPO

## 2015-12-19 ENCOUNTER — Inpatient Hospital Stay: Admission: RE | Admit: 2015-12-19 | Payer: PPO | Source: Ambulatory Visit

## 2016-02-19 ENCOUNTER — Inpatient Hospital Stay: Admission: RE | Admit: 2016-02-19 | Payer: PPO | Source: Ambulatory Visit

## 2016-02-19 ENCOUNTER — Ambulatory Visit: Payer: PPO

## 2017-02-02 DIAGNOSIS — E785 Hyperlipidemia, unspecified: Secondary | ICD-10-CM | POA: Diagnosis not present

## 2017-02-02 DIAGNOSIS — R251 Tremor, unspecified: Secondary | ICD-10-CM | POA: Diagnosis not present

## 2017-02-02 DIAGNOSIS — Z79899 Other long term (current) drug therapy: Secondary | ICD-10-CM | POA: Diagnosis not present

## 2017-02-02 DIAGNOSIS — Z6822 Body mass index (BMI) 22.0-22.9, adult: Secondary | ICD-10-CM | POA: Diagnosis not present

## 2017-02-02 DIAGNOSIS — R062 Wheezing: Secondary | ICD-10-CM | POA: Diagnosis not present

## 2017-02-02 DIAGNOSIS — F329 Major depressive disorder, single episode, unspecified: Secondary | ICD-10-CM | POA: Diagnosis not present

## 2017-02-02 DIAGNOSIS — Z9181 History of falling: Secondary | ICD-10-CM | POA: Diagnosis not present

## 2017-02-10 ENCOUNTER — Encounter: Payer: Self-pay | Admitting: Neurology

## 2017-03-09 NOTE — Progress Notes (Deleted)
Subjective:   [name] was seen in consultation in the movement disorder clinic at the request of Wenda Low, MD.  The evaluation is for tremor.  Tremor started approximately *** ago (had an appointment in our office for eval of same in 2015 but cx appointment) and involves the ***.  Tremor is most noticeable when ***.   There is *** family hx of tremor.    Affected by caffeine:  {yes no:314532} Affected by alcohol:  {yes no:314532} Affected by stress:  {yes no:314532} Affected by fatigue:  {yes no:314532} Spills soup if on spoon:  {yes no:314532} Spills glass of liquid if full:  {yes no:314532} Affects ADL's (tying shoes, brushing teeth, etc):  {yes no:314532}  Current/Previously tried tremor medications: ***primidone ("didn't work - ? Dose); started on propranolol LA 60 mg 02/02/17  Current medications that may exacerbate tremor:  ***  Outside reports reviewed: {Outside review:15817}.  No Known Allergies  Outpatient Encounter Prescriptions as of 03/11/2017  Medication Sig  . atenolol (TENORMIN) 25 MG tablet Take 25 mg by mouth every morning.  . calcium carbonate 200 MG capsule Take 250 mg by mouth daily.  . cholecalciferol (VITAMIN D) 1000 UNITS tablet Take 1,000 Units by mouth daily.  . Fluticasone-Salmeterol (ADVAIR) 250-50 MCG/DOSE AEPB Inhale 1 puff into the lungs every 12 (twelve) hours as needed (for breathing).  . hydrocortisone-pramoxine (ANALPRAM-HC) 2.5-1 % rectal cream PLACE RECTALLY 4 (FOUR) TIMES DAILY. FOR IRRITATED AND PAINFUL HEMORRHOIDS  . Multiple Vitamin (MULTIVITAMIN) tablet Take 1 tablet by mouth daily.  . primidone (MYSOLINE) 50 MG tablet Take 25 mg by mouth every morning.   . psyllium (REGULOID) 0.52 G capsule Take 0.52 g by mouth daily. Fiber pill qd   No facility-administered encounter medications on file as of 03/11/2017.     Past Medical History:  Diagnosis Date  . Anemia   . Cancer 2007   lung cancer, right, chemo done  . Complication of  anesthesia 2007   slow to awaken  . History of blood transfusion 2010  . Hypertension   . Occasional tremors     Past Surgical History:  Procedure Laterality Date  . COLONOSCOPY WITH PROPOFOL N/A 11/28/2012   Procedure: COLONOSCOPY WITH PROPOFOL;  Surgeon: Garlan Fair, MD;  Location: [facility];  Service: Endoscopy;  Laterality: N/A;  . right lung lobectomy  2007    Social History   Social History  . Marital status: Widowed    Spouse name: N/A  . Number of children: N/A  . Years of education: N/A   Occupational History  . Not on file.   Social History Main Topics  . Smoking status: Former Smoker    Packs/day: 3.00    Years: 25.00    Types: Cigarettes    Quit date: 07/13/2003  . Smokeless tobacco: Never Used  . Alcohol use 4.2 oz/week    7 Glasses of wine per week     Comment: 1 glass winde daily  . Drug use: No  . Sexual activity: Not on file   Other Topics Concern  . Not on file   Social History Narrative  . No narrative on file    Family Status  Relation Status  . Mother Deceased  . Father Deceased    Review of Systems A complete 10 system ROS was obtained and was negative apart from what is mentioned.   Objective:   VITALS:  There were no vitals filed for this visit. Gen:  Appears stated age and  in NAD. HEENT:  Normocephalic, atraumatic. The mucous membranes are moist. The superficial temporal arteries are without ropiness or tenderness. Cardiovascular: Regular rate and rhythm. Lungs: Clear to auscultation bilaterally. Neck: There are no carotid bruits noted bilaterally.  NEUROLOGICAL:  Orientation:  The patient is alert and oriented x 3.  Recent and remote memory are intact.  Attention span and concentration are normal.  Able to name objects and repeat without trouble.  Fund of knowledge is appropriate Cranial nerves: There is good facial symmetry. The pupils are equal round and reactive to light bilaterally. Fundoscopic exam reveals clear  disc margins bilaterally. Extraocular muscles are intact and visual fields are full to confrontational testing. Speech is fluent and clear. Soft palate rises symmetrically and there is no tongue deviation. Hearing is intact to conversational tone. Tone: Tone is good throughout. Sensation: Sensation is intact to light touch and pinprick throughout (facial, trunk, extremities). Vibration is intact at the bilateral big toe. There is no extinction with double simultaneous stimulation. There is no sensory dermatomal level identified. Coordination:  The patient has no dysdiadichokinesia or dysmetria. Motor: Strength is 5/5 in the bilateral upper and lower extremities.  Shoulder shrug is equal bilaterally.  There is no pronator drift.  There are no fasciculations noted. DTR's: Deep tendon reflexes are 2/4 at the bilateral biceps, triceps, brachioradialis, patella and achilles.  Plantar responses are downgoing bilaterally. Gait and Station: The patient is able to ambulate without difficulty. The patient is able to heel toe walk without any difficulty. The patient is able to ambulate in a tandem fashion. The patient is able to stand in the Romberg position.   MOVEMENT EXAM: Tremor:  There is *** tremor in the UE, noted most significantly with action.  The patient is *** able to draw Archimedes spirals without significant difficulty.  There is *** tremor at rest.  The patient is *** able to pour water from one glass to another without spilling it.     Assessment/Plan:   1.  Essential Tremor.  -This is evidenced by the symmetrical nature and longstanding hx of gradually getting worse.  We discussed nature and pathophysiology.  We discussed that this can continue to gradually get worse with time.  We discussed that some medications can worsen this, as can caffeine use.  We discussed medication therapy as well as surgical therapy.  Ultimately, the patient decided to ***.    CC:  Wenda Low, MD

## 2017-03-11 ENCOUNTER — Ambulatory Visit: Payer: PPO | Admitting: Neurology

## 2017-04-07 NOTE — Progress Notes (Deleted)
Subjective:   [name] was seen in consultation in the movement disorder clinic at the request of Ronita Hipps, MD.    The evaluation is for tremor.  Tremor started approximately *** ago (had an appointment in our office for eval of same in 2015 but cx appointment) and involves the ***.  Tremor is most noticeable when ***.   There is *** family hx of tremor.    Affected by caffeine:  {yes no:314532} Affected by alcohol:  {yes no:314532} Affected by stress:  {yes no:314532} Affected by fatigue:  {yes no:314532} Spills soup if on spoon:  {yes no:314532} Spills glass of liquid if full:  {yes no:314532} Affects ADL's (tying shoes, brushing teeth, etc):  {yes no:314532}  Current/Previously tried tremor medications: ***primidone ("didn't work - ? Dose); started on propranolol LA 60 mg 02/02/17  Current medications that may exacerbate tremor:  ***  Outside reports reviewed: {Outside review:15817}.  No Known Allergies  Outpatient Encounter Prescriptions as of 04/11/2017  Medication Sig  . atenolol (TENORMIN) 25 MG tablet Take 25 mg by mouth every morning.  . calcium carbonate 200 MG capsule Take 250 mg by mouth daily.  . cholecalciferol (VITAMIN D) 1000 UNITS tablet Take 1,000 Units by mouth daily.  . Fluticasone-Salmeterol (ADVAIR) 250-50 MCG/DOSE AEPB Inhale 1 puff into the lungs every 12 (twelve) hours as needed (for breathing).  . hydrocortisone-pramoxine (ANALPRAM-HC) 2.5-1 % rectal cream PLACE RECTALLY 4 (FOUR) TIMES DAILY. FOR IRRITATED AND PAINFUL HEMORRHOIDS  . Multiple Vitamin (MULTIVITAMIN) tablet Take 1 tablet by mouth daily.  . primidone (MYSOLINE) 50 MG tablet Take 25 mg by mouth every morning.   . psyllium (REGULOID) 0.52 G capsule Take 0.52 g by mouth daily. Fiber pill qd   No facility-administered encounter medications on file as of 04/11/2017.     Past Medical History:  Diagnosis Date  . Anemia   . Cancer 2007   lung cancer, right, chemo done  . Complication  of anesthesia 2007   slow to awaken  . History of blood transfusion 2010  . Hypertension   . Occasional tremors     Past Surgical History:  Procedure Laterality Date  . COLONOSCOPY WITH PROPOFOL N/A 11/28/2012   Procedure: COLONOSCOPY WITH PROPOFOL;  Surgeon: Garlan Fair, MD;  Location: [facility];  Service: Endoscopy;  Laterality: N/A;  . right lung lobectomy  2007    Social History   Social History  . Marital status: Widowed    Spouse name: N/A  . Number of children: N/A  . Years of education: N/A   Occupational History  . Not on file.   Social History Main Topics  . Smoking status: Former Smoker    Packs/day: 3.00    Years: 25.00    Types: Cigarettes    Quit date: 07/13/2003  . Smokeless tobacco: Never Used  . Alcohol use 4.2 oz/week    7 Glasses of wine per week     Comment: 1 glass winde daily  . Drug use: No  . Sexual activity: Not on file   Other Topics Concern  . Not on file   Social History Narrative  . No narrative on file    Family Status  Relation Status  . Mother Deceased  . Father Deceased    Review of Systems A complete 10 system ROS was obtained and was negative apart from what is mentioned.   Objective:   VITALS:  There were no vitals filed for this visit. Gen:  Appears  stated age and in NAD. HEENT:  Normocephalic, atraumatic. The mucous membranes are moist. The superficial temporal arteries are without ropiness or tenderness. Cardiovascular: Regular rate and rhythm. Lungs: Clear to auscultation bilaterally. Neck: There are no carotid bruits noted bilaterally.  NEUROLOGICAL:  Orientation:  The patient is alert and oriented x 3.  Recent and remote memory are intact.  Attention span and concentration are normal.  Able to name objects and repeat without trouble.  Fund of knowledge is appropriate Cranial nerves: There is good facial symmetry. The pupils are equal round and reactive to light bilaterally. Fundoscopic exam reveals clear  disc margins bilaterally. Extraocular muscles are intact and visual fields are full to confrontational testing. Speech is fluent and clear. Soft palate rises symmetrically and there is no tongue deviation. Hearing is intact to conversational tone. Tone: Tone is good throughout. Sensation: Sensation is intact to light touch and pinprick throughout (facial, trunk, extremities). Vibration is intact at the bilateral big toe. There is no extinction with double simultaneous stimulation. There is no sensory dermatomal level identified. Coordination:  The patient has no dysdiadichokinesia or dysmetria. Motor: Strength is 5/5 in the bilateral upper and lower extremities.  Shoulder shrug is equal bilaterally.  There is no pronator drift.  There are no fasciculations noted. DTR's: Deep tendon reflexes are 2/4 at the bilateral biceps, triceps, brachioradialis, patella and achilles.  Plantar responses are downgoing bilaterally. Gait and Station: The patient is able to ambulate without difficulty. The patient is able to heel toe walk without any difficulty. The patient is able to ambulate in a tandem fashion. The patient is able to stand in the Romberg position.   MOVEMENT EXAM: Tremor:  There is *** tremor in the UE, noted most significantly with action.  The patient is *** able to draw Archimedes spirals without significant difficulty.  There is *** tremor at rest.  The patient is *** able to pour water from one glass to another without spilling it.  labs  Her primary care physician sent labwork dated 02/02/2017.  White blood cells were 5.6, hemoglobin 13.1, hematocrit 39.1 and platelets 219.  Sodium was 139, potassium 3.6, chloride 97, CO2 26, BUN 11, creatinine 0.57, AST 103, ALT 91, alkaline phosphatase 80.  TSH was 1.196.  B12 was 311.     Assessment/Plan:   1.  Essential Tremor.  -This is evidenced by the symmetrical nature and longstanding hx of gradually getting worse.  We discussed nature and  pathophysiology.  We discussed that this can continue to gradually get worse with time.  We discussed that some medications can worsen this, as can caffeine use.  We discussed medication therapy as well as surgical therapy.  Ultimately, the patient decided to ***.    2.  Transaminasemia  -Patient AST/ALT were both elevated when her lab work was checked in July.  3.  Mild b12 deficiency  -***would like to see level over 400.  Recommend oral supplement 1085mcg daily  CC:  Wenda Low, MD

## 2017-04-11 ENCOUNTER — Ambulatory Visit: Payer: PPO | Admitting: Neurology

## 2017-04-11 DIAGNOSIS — Z029 Encounter for administrative examinations, unspecified: Secondary | ICD-10-CM

## 2017-05-09 DIAGNOSIS — S199XXA Unspecified injury of neck, initial encounter: Secondary | ICD-10-CM | POA: Diagnosis not present

## 2017-05-09 DIAGNOSIS — S42214A Unspecified nondisplaced fracture of surgical neck of right humerus, initial encounter for closed fracture: Secondary | ICD-10-CM | POA: Diagnosis not present

## 2017-05-09 DIAGNOSIS — T148XXA Other injury of unspecified body region, initial encounter: Secondary | ICD-10-CM | POA: Diagnosis not present

## 2017-05-09 DIAGNOSIS — M25511 Pain in right shoulder: Secondary | ICD-10-CM | POA: Diagnosis not present

## 2017-05-09 DIAGNOSIS — M542 Cervicalgia: Secondary | ICD-10-CM | POA: Diagnosis not present

## 2017-05-09 DIAGNOSIS — S42201A Unspecified fracture of upper end of right humerus, initial encounter for closed fracture: Secondary | ICD-10-CM | POA: Diagnosis not present

## 2017-05-17 DIAGNOSIS — Z1339 Encounter for screening examination for other mental health and behavioral disorders: Secondary | ICD-10-CM | POA: Diagnosis not present

## 2017-05-17 DIAGNOSIS — S42291S Other displaced fracture of upper end of right humerus, sequela: Secondary | ICD-10-CM | POA: Diagnosis not present

## 2017-05-17 DIAGNOSIS — Z1331 Encounter for screening for depression: Secondary | ICD-10-CM | POA: Diagnosis not present

## 2017-05-17 DIAGNOSIS — Z6821 Body mass index (BMI) 21.0-21.9, adult: Secondary | ICD-10-CM | POA: Diagnosis not present

## 2017-05-19 DIAGNOSIS — S42201A Unspecified fracture of upper end of right humerus, initial encounter for closed fracture: Secondary | ICD-10-CM | POA: Diagnosis not present

## 2017-10-11 DIAGNOSIS — E46 Unspecified protein-calorie malnutrition: Secondary | ICD-10-CM | POA: Diagnosis not present

## 2017-10-11 DIAGNOSIS — F1020 Alcohol dependence, uncomplicated: Secondary | ICD-10-CM | POA: Diagnosis not present

## 2017-10-11 DIAGNOSIS — F419 Anxiety disorder, unspecified: Secondary | ICD-10-CM | POA: Diagnosis not present

## 2017-10-11 DIAGNOSIS — F322 Major depressive disorder, single episode, severe without psychotic features: Secondary | ICD-10-CM | POA: Diagnosis not present

## 2017-10-11 DIAGNOSIS — J449 Chronic obstructive pulmonary disease, unspecified: Secondary | ICD-10-CM | POA: Diagnosis not present

## 2017-10-11 DIAGNOSIS — R600 Localized edema: Secondary | ICD-10-CM | POA: Diagnosis not present

## 2017-10-11 DIAGNOSIS — Z1389 Encounter for screening for other disorder: Secondary | ICD-10-CM | POA: Diagnosis not present

## 2017-10-11 DIAGNOSIS — C3490 Malignant neoplasm of unspecified part of unspecified bronchus or lung: Secondary | ICD-10-CM | POA: Diagnosis not present

## 2017-10-13 ENCOUNTER — Ambulatory Visit
Admission: RE | Admit: 2017-10-13 | Discharge: 2017-10-13 | Disposition: A | Discharge status: Home / Self Care | Payer: PPO | Source: Ambulatory Visit | Attending: Internal Medicine | Admitting: Internal Medicine

## 2017-10-13 ENCOUNTER — Other Ambulatory Visit: Payer: Self-pay | Admitting: Internal Medicine

## 2017-10-13 DIAGNOSIS — C3490 Malignant neoplasm of unspecified part of unspecified bronchus or lung: Secondary | ICD-10-CM

## 2017-10-13 DIAGNOSIS — C3491 Malignant neoplasm of unspecified part of right bronchus or lung: Secondary | ICD-10-CM | POA: Diagnosis not present

## 2018-04-22 DIAGNOSIS — H6691 Otitis media, unspecified, right ear: Secondary | ICD-10-CM | POA: Diagnosis not present

## 2018-05-04 DIAGNOSIS — H6981 Other specified disorders of Eustachian tube, right ear: Secondary | ICD-10-CM | POA: Diagnosis not present

## 2018-05-12 DIAGNOSIS — H6981 Other specified disorders of Eustachian tube, right ear: Secondary | ICD-10-CM | POA: Diagnosis not present

## 2018-05-22 DIAGNOSIS — H6981 Other specified disorders of Eustachian tube, right ear: Secondary | ICD-10-CM | POA: Diagnosis not present

## 2018-05-29 DIAGNOSIS — Z7289 Other problems related to lifestyle: Secondary | ICD-10-CM | POA: Diagnosis not present

## 2018-05-29 DIAGNOSIS — K120 Recurrent oral aphthae: Secondary | ICD-10-CM | POA: Diagnosis not present

## 2018-05-29 DIAGNOSIS — F17200 Nicotine dependence, unspecified, uncomplicated: Secondary | ICD-10-CM | POA: Diagnosis not present

## 2018-05-29 DIAGNOSIS — Z23 Encounter for immunization: Secondary | ICD-10-CM | POA: Diagnosis not present

## 2018-05-29 DIAGNOSIS — K148 Other diseases of tongue: Secondary | ICD-10-CM | POA: Diagnosis not present

## 2018-05-29 DIAGNOSIS — F419 Anxiety disorder, unspecified: Secondary | ICD-10-CM | POA: Diagnosis not present

## 2018-05-29 DIAGNOSIS — R220 Localized swelling, mass and lump, head: Secondary | ICD-10-CM | POA: Diagnosis not present

## 2018-05-29 DIAGNOSIS — F322 Major depressive disorder, single episode, severe without psychotic features: Secondary | ICD-10-CM | POA: Diagnosis not present

## 2018-05-29 DIAGNOSIS — C029 Malignant neoplasm of tongue, unspecified: Secondary | ICD-10-CM | POA: Diagnosis not present

## 2018-05-29 DIAGNOSIS — E782 Mixed hyperlipidemia: Secondary | ICD-10-CM | POA: Diagnosis not present

## 2018-05-30 DIAGNOSIS — K148 Other diseases of tongue: Secondary | ICD-10-CM | POA: Diagnosis not present

## 2018-05-31 ENCOUNTER — Other Ambulatory Visit: Payer: Self-pay | Admitting: Physician Assistant

## 2018-05-31 DIAGNOSIS — K148 Other diseases of tongue: Secondary | ICD-10-CM

## 2018-06-02 ENCOUNTER — Other Ambulatory Visit (HOSPITAL_COMMUNITY): Payer: PPO | Admitting: Dentistry

## 2018-06-05 ENCOUNTER — Other Ambulatory Visit (HOSPITAL_COMMUNITY): Payer: PPO | Admitting: Dentistry

## 2018-06-05 ENCOUNTER — Telehealth (HOSPITAL_COMMUNITY): Payer: Self-pay

## 2018-06-05 NOTE — Telephone Encounter (Signed)
Patient called Dental Medicine an hour before appointment to cancel stating that she has a bad stomach infection.  [name]

## 2018-06-06 ENCOUNTER — Ambulatory Visit
Admission: RE | Admit: 2018-06-06 | Discharge: 2018-06-06 | Disposition: A | Discharge status: Home / Self Care | Payer: PPO | Source: Ambulatory Visit | Attending: Physician Assistant | Admitting: Physician Assistant

## 2018-06-06 DIAGNOSIS — K148 Other diseases of tongue: Secondary | ICD-10-CM

## 2018-06-06 DIAGNOSIS — R918 Other nonspecific abnormal finding of lung field: Secondary | ICD-10-CM | POA: Diagnosis not present

## 2018-06-06 DIAGNOSIS — R220 Localized swelling, mass and lump, head: Secondary | ICD-10-CM | POA: Diagnosis not present

## 2018-06-06 MED ORDER — IOPAMIDOL (ISOVUE-300) INJECTION 61%
75.0000 mL | Freq: Once | INTRAVENOUS | Status: AC | PRN
  Administered 2018-06-06: 75 mL via INTRAVENOUS

## 2018-06-12 ENCOUNTER — Other Ambulatory Visit: Payer: Self-pay | Admitting: Internal Medicine

## 2018-06-12 ENCOUNTER — Other Ambulatory Visit (HOSPITAL_COMMUNITY): Payer: Self-pay | Admitting: Internal Medicine

## 2018-06-12 DIAGNOSIS — R911 Solitary pulmonary nodule: Secondary | ICD-10-CM

## 2018-06-12 DIAGNOSIS — I700 Atherosclerosis of aorta: Secondary | ICD-10-CM | POA: Diagnosis not present

## 2018-06-12 DIAGNOSIS — K148 Other diseases of tongue: Secondary | ICD-10-CM | POA: Diagnosis not present

## 2018-06-13 NOTE — Progress Notes (Signed)
error 

## 2018-06-14 ENCOUNTER — Encounter (HOSPITAL_COMMUNITY): Payer: Self-pay | Admitting: Dentistry

## 2018-06-14 ENCOUNTER — Encounter: Payer: Self-pay | Admitting: *Deleted

## 2018-06-14 ENCOUNTER — Ambulatory Visit (HOSPITAL_COMMUNITY): Payer: Self-pay | Admitting: Dentistry

## 2018-06-14 VITALS — BP 121/74 | HR 89 | Temp 97.8°F

## 2018-06-14 DIAGNOSIS — K08409 Partial loss of teeth, unspecified cause, unspecified class: Secondary | ICD-10-CM

## 2018-06-14 DIAGNOSIS — K0889 Other specified disorders of teeth and supporting structures: Secondary | ICD-10-CM

## 2018-06-14 DIAGNOSIS — K06010 Localized gingival recession, unspecified: Secondary | ICD-10-CM

## 2018-06-14 DIAGNOSIS — F40232 Fear of other medical care: Secondary | ICD-10-CM

## 2018-06-14 DIAGNOSIS — M264 Malocclusion, unspecified: Secondary | ICD-10-CM

## 2018-06-14 DIAGNOSIS — K140 Glossitis: Secondary | ICD-10-CM | POA: Diagnosis not present

## 2018-06-14 DIAGNOSIS — K0530 Chronic periodontitis, unspecified: Secondary | ICD-10-CM

## 2018-06-14 DIAGNOSIS — K036 Deposits [accretions] on teeth: Secondary | ICD-10-CM

## 2018-06-14 DIAGNOSIS — K045 Chronic apical periodontitis: Secondary | ICD-10-CM

## 2018-06-14 DIAGNOSIS — K029 Dental caries, unspecified: Secondary | ICD-10-CM

## 2018-06-14 DIAGNOSIS — K083 Retained dental root: Secondary | ICD-10-CM

## 2018-06-14 DIAGNOSIS — J392 Other diseases of pharynx: Secondary | ICD-10-CM

## 2018-06-14 NOTE — Progress Notes (Signed)
DENTAL CONSULTATION  Date of Consultation:  06/14/2018 Patient Name:   [name] Date of Birth:   Nov 26, 1950 Medical Record Number: [id number]  VITALS: BP 121/74 (BP Location: Left Arm)   Pulse 89   Temp 97.8 F (36.6 C)   CHIEF COMPLAINT: Patient referred by Dr. Constance Holster for a dental consultation.  HPI: [name] is a 67 year old female with left lateral tongue ulceration that may represent cancer.  Patient referred to Dental Medicine for evaluation of poor dentition.  Patient with anticipated biopsy procedure under general anesthesia that may convert to left partial glossectomy if cancer is diagnosed at that time. Request made by Dr. Constance Holster to consider proceeding with multiple extractions at the same time of the surgery if cancer diagnosis is confirmed.  The patient currently denies acute toothaches, swellings, or abscesses. The patient indicates that she saw a dentist in [place], [place] approximately 2-3 years ago for an exam and cleaning.  Patient was planning to see the dentist in [place] for follow-up examination but other medical issues arose.  The patient denies having partial dentures. 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    PROBLEM LIST: Patient Active Problem List   Diagnosis Date Noted  . Tongue ulceration 06/14/2018  . Personal history of colonic polyps 01/29/2013  . Internal prolapsed hemorrhoids 01/29/2013    PMH: Past Medical History:  Diagnosis Date  . Anemia   . Cancer [facility]) 2007   lung cancer, right, chemo done  . Complication of anesthesia 2007   slow to awaken  . History of blood transfusion 2010  . Hypertension   . Occasional tremors     PSH: Past Surgical History:  Procedure Laterality Date  . COLONOSCOPY WITH PROPOFOL N/A 11/28/2012   Procedure: COLONOSCOPY WITH PROPOFOL;  Surgeon: Garlan Fair, MD;  Location: [facility];  Service: Endoscopy;  Laterality: N/A;  . right lung lobectomy  2007    ALLERGIES: No Known  Allergies  MEDICATIONS: Current Outpatient Medications  Medication Sig Dispense Refill  . atenolol (TENORMIN) 25 MG tablet Take 25 mg by mouth every morning.    . Fluticasone-Salmeterol (ADVAIR) 250-50 MCG/DOSE AEPB Inhale 1 puff into the lungs every 12 (twelve) hours as needed (for breathing).    . calcium carbonate 200 MG capsule Take 250 mg by mouth daily.    . cholecalciferol (VITAMIN D) 1000 UNITS tablet Take 1,000 Units by mouth daily.    . hydrocortisone-pramoxine (ANALPRAM-HC) 2.5-1 % rectal cream PLACE RECTALLY 4 (FOUR) TIMES DAILY. FOR IRRITATED AND PAINFUL HEMORRHOIDS (Patient not taking: Reported on 06/14/2018) 30 g 2  . Multiple Vitamin (MULTIVITAMIN) tablet Take 1 tablet by mouth daily.    . primidone (MYSOLINE) 50 MG tablet Take 25 mg by mouth every morning.     . psyllium (REGULOID) 0.52 G capsule Take 0.52 g by mouth daily. Fiber pill qd     No current facility-administered medications for this visit.     LABS: Lab Results  Component Value Date   WBC 5.7 11/28/2012   HGB 13.8 11/28/2012   HCT 40.9 11/28/2012   MCV 97.4 11/28/2012   PLT 224 11/28/2012      Component Value Date/Time   NA 138 01/14/2011 0914   NA 144 03/26/2009 0824   K 4.0 01/14/2011 0914   K 4.7 03/26/2009 0824   CL 100 01/14/2011 0914   CL 106 03/26/2009 0824   CO2 25 01/14/2011 0914   CO2 31 03/26/2009 0824   GLUCOSE 89  01/14/2011 0914   GLUCOSE 96 03/26/2009 0824   BUN 10 01/14/2011 0914   BUN 9 03/26/2009 0824   CREATININE 0.64 01/14/2011 0914   CREATININE 0.5 (L) 03/26/2009 0824   CALCIUM 9.6 01/14/2011 0914   CALCIUM 9.4 03/26/2009 0824   No results found for: INR, PROTIME No results found for: PTT  SOCIAL HISTORY: Social History   Socioeconomic History  . Marital status: Widowed    Spouse name: Not on file  . Number of children: Not on file  . Years of education: Not on file  . Highest education level: Not on file  Occupational History  . Not on file  Social Needs  .  Financial resource strain: Not on file  . Food insecurity:    Worry: Not on file    Inability: Not on file  . Transportation needs:    Medical: Not on file    Non-medical: Not on file  Tobacco Use  . Smoking status: Former Smoker    Packs/day: 3.00    Years: 25.00    Pack years: 75.00    Types: Cigarettes    Last attempt to quit: 07/13/2003    Years since quitting: 14.9  . Smokeless tobacco: Never Used  Substance and Sexual Activity  . Alcohol use: Yes    Alcohol/week: 7.0 standard drinks    Types: 7 Glasses of wine per week    Comment: 1 glass winde daily  . Drug use: No  . Sexual activity: Not on file  Lifestyle  . Physical activity:    Days per week: Not on file    Minutes per session: Not on file  . Stress: Not on file  Relationships  . Social connections:    Talks on phone: Not on file    Gets together: Not on file    Attends religious service: Not on file    Active member of club or organization: Not on file    Attends meetings of clubs or organizations: Not on file    Relationship status: Not on file  . Intimate partner violence:    Fear of current or ex partner: Not on file    Emotionally abused: Not on file    Physically abused: Not on file    Forced sexual activity: Not on file  Other Topics Concern  . Not on file  Social History Narrative  . Not on file    FAMILY HISTORY: Family History  Problem Relation Age of Onset  . Cancer Mother        bladder  . Heart disease Father   . Diabetes Father     REVIEW OF SYSTEMS: Reviewed with the patient as per History of present illness. Psych: Patient does have a history of dental phobia as a child.  DENTAL HISTORY: CHIEF COMPLAINT: Patient referred by Dr. Constance Holster for a dental consultation.  HPI: [name] is a 67 year old female with left lateral tongue ulceration that may represent cancer.  Patient referred to Dental Medicine for evaluation of poor dentition.  Patient with anticipated biopsy procedure  under general anesthesia that may convert to left partial glossectomy if cancer is diagnosed at that time. Request made by Dr. Constance Holster to consider proceeding with multiple extractions at the same time of the surgery if cancer diagnosis is confirmed.  The patient currently denies acute toothaches, swellings, or abscesses. The patient indicates that she saw a dentist in [place], [place] approximately 2-3 years ago for an exam and cleaning.  Patient was  planning to see the dentist in [place] for follow-up examination but other medical issues arose.  The patient denies having partial dentures. 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    DENTAL EXAMINATION: GENERAL:  The patient is a well-developed female in no acute distress.  HEAD AND NECK:  There is no palpable writer neck left lymphadenopathy. The patient denies acute TMJ symptoms. Interincisal opening is measured at 40 mm. INTRAORAL EXAM:  The patient has norm saliva. The patient has a mid palatal torus. There is no evidence of oral abscess formation.the patient has a left lateral tongue ulceration that measures approximately 4 cm. Patient has 2 right lateral tongue ulcerations that measure approximately 1 mm round at this time. DENTITION:  The patient is missing tooth numbers 1 and 16. There are retained root segments in the area of tooth numbers 4, 5, 6, 7, 15, 18, 29 and 32. PERIODONTAL: The patient has chronic periodontitis with plaque and calculus accumulations, generalized gingival recession, and mandibular anterior incipient tooth mobility. DENTAL CARIES/SUBOPTIMAL RESTORATIONS:  The patient has rampant dental caries and multiple suboptimal dental restorations. ENDODONTIC:  The patient currently denies acute pulpitis symptoms. Patient has multiple areas of periapical pathology and radiolucency. Tooth #19 has a previous root canal therapy that is now exposed to the oral environment. CROWN AND BRIDGE: The patient has crowns on tooth  numbers 3 and 30. There are recurrent caries on the margins of the crowns at this time. PROSTHODONTIC:  Patient denies having partial dentures. OCCLUSION:  The patient has a poor occlusal scheme secondary to missing teeth and multiple retained root segments.  RADIOGRAPHIC INTERPRETATION: An orthopantogram was taken and supplemented with 5 periapical radiographs. The patient refused additional periapical radiographs secondary to gag reflex and lack of cooperation. There are multiple missing teeth. There are multiple retained root segments. There are multiple areas of periapical pathology and radiolucency. Patient has rampant dental caries. There is incipient to moderate bone loss. Multiple suboptimal dental restorations are noted. Patient has crowns on tooth numbers 3 and 30 with recurrent caries. Patient has a previous root canal therapy on tooth #19 and that is now exposed to the oral environment.  ASSESSMENTS: 1. Left lateral tongue ulceration-measures approximately 4 cm x 1 cm 2. Right lateral tongue ulcerations 2 measuring approximately one mm round each 3. Chronic apical periodontitis 4. Rampant dental caries 5. Multiple retained root segments 6. Chronic periodontitis with bone loss 7. Gingival recession 8. Accretions 9. Tooth mobility 10. Small mid palatal torus 11. Multiple missing teeth 12. 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 We discussed various treatment options to include no treatment, multiple extractions with alveoloplasty, pre-prosthetic surgery as indicated, periodontal therapy, dental restorations, root canal therapy, crown and bridge therapy, implant therapy, and replacement of missing  teeth as indicated. We also discussed referral to an oral surgeon for future dental extraction procedures due to the complexity of her treatment.  The patient currently wishes to defer any dental treatment until a definitive cancer diagnosis is obtained. The patient will then be referred to an oral surgeon as indicated for second opinion concerning extraction of remaining teeth with alveoloplasty.  The patient will then follow-up with a dentist of her choice for fabrication of upper lower complete dentures after adequate healing and  at least 3 months after the last radiation therapy has provided if postoperative radiation therapy is indicated.   2. Discussion of findings with medical team and coordination of future medical and dental care as needed.  I spent in excess of  120 minutes during the conduct of this consultation and >50% of this time involved direct face-to-face encounter for counseling and/or coordination of the patient's care.    Lenn Cal, DDS

## 2018-06-14 NOTE — Progress Notes (Signed)
Oncology Nurse Navigator Documentation  Met with [name] following this morning's Dental Medicine appt.   Introduced myself, provided contact information.  I explained she has been referred by Dr. Constance Holster to see Dr. Isidore Moos, RadOnc, She voiced understanding I will be rescheduling her appt from this Friday to a date/time after her 12/10 PET.   In a follow-up phone call, she expressed preference for late morning appts, asked that I e-mail her with new appt information. I e-mailed her with 12/17 10:30 appt, encouraged her to contact me with questions/concerns.  Gayleen Orem, RN, BSN Head & Neck Oncology Nurse [place] [facility] [place] [phone]

## 2018-06-14 NOTE — Patient Instructions (Signed)

## 2018-06-15 ENCOUNTER — Institutional Professional Consult (permissible substitution): Payer: Self-pay | Admitting: Internal Medicine

## 2018-06-16 ENCOUNTER — Ambulatory Visit
Admission: RE | Admit: 2018-06-16 | Discharge: 2018-06-16 | Disposition: A | Discharge status: Home / Self Care | Payer: PPO | Source: Ambulatory Visit | Attending: Radiation Oncology | Admitting: Radiation Oncology

## 2018-06-19 ENCOUNTER — Telehealth: Payer: Self-pay | Admitting: *Deleted

## 2018-06-19 NOTE — Telephone Encounter (Signed)
Oncology Nurse Navigator Documentation  Called [name], reminded her of 9:30 arrival to [facility] tomorrow morning for PET, reminded her of NPO status beginning midnight tonight.  She voiced understanding.  Gayleen Orem, RN, BSN Head & Neck Oncology Nurse [place] [facility] [place] [phone]

## 2018-06-20 ENCOUNTER — Ambulatory Visit (HOSPITAL_COMMUNITY): Payer: PPO

## 2018-06-21 ENCOUNTER — Ambulatory Visit (HOSPITAL_COMMUNITY)
Admission: RE | Admit: 2018-06-21 | Discharge: 2018-06-21 | Disposition: A | Discharge status: Home / Self Care | Payer: PPO | Source: Ambulatory Visit | Attending: Internal Medicine | Admitting: Internal Medicine

## 2018-06-21 DIAGNOSIS — R911 Solitary pulmonary nodule: Secondary | ICD-10-CM | POA: Diagnosis not present

## 2018-06-21 LAB — GLUCOSE, CAPILLARY: GLUCOSE-CAPILLARY: 75 mg/dL (ref 70–99)

## 2018-06-21 MED ORDER — FLUDEOXYGLUCOSE F - 18 (FDG) INJECTION
7.0000 | Freq: Once | INTRAVENOUS | Status: AC | PRN
  Administered 2018-06-21: 7 via INTRAVENOUS

## 2018-06-23 ENCOUNTER — Encounter: Payer: Self-pay | Admitting: Internal Medicine

## 2018-06-23 ENCOUNTER — Ambulatory Visit (INDEPENDENT_AMBULATORY_CARE_PROVIDER_SITE_OTHER): Payer: PPO | Admitting: Internal Medicine

## 2018-06-23 VITALS — BP 104/60 | HR 110 | Ht 64.5 in | Wt 116.0 lb

## 2018-06-23 DIAGNOSIS — F17210 Nicotine dependence, cigarettes, uncomplicated: Secondary | ICD-10-CM | POA: Diagnosis not present

## 2018-06-23 DIAGNOSIS — J449 Chronic obstructive pulmonary disease, unspecified: Secondary | ICD-10-CM | POA: Diagnosis not present

## 2018-06-23 DIAGNOSIS — R911 Solitary pulmonary nodule: Secondary | ICD-10-CM

## 2018-06-23 NOTE — Patient Instructions (Addendum)
The key is to stop smoking completely before smoking completely stops you!   Avoid mint and menthol lozenges    use your albuterol as a rescue medication to be used if you control your breathing by pacing or resting - The less you use it, the better it will work when you need it. - Ok to use up to 2 puffs  every 4 hours if you must but call for immediate appointment if use goes up over your usual need - Don't leave home without it !!  (think of it like the spare tire for your car)   Stop advair as it may irritate mouth and throat   I will write Dr Constance Holster to rec proceed with evaluation of your tongue asap - we can see you here pre-op if needed  and   set you up see our lung nodule  Expert Dr Lamonte Sakai which is fine for 3 month

## 2018-06-23 NOTE — Progress Notes (Signed)
[name], female    DOB: Sep 20, 1950,   MRN: [id number]    Brief patient profile:  89 yowf  Active smoker with h/o lung ca on R and new tongue pain late Nov 2019 > [facility] eval with incidental SPN LLL referred to pulmonary clinic 06/23/2018 by Dr   Constance Holster with clinical suspicion of tongue cancer.   11/21/10 discharge summary  Right lower lobe mass status post right video-assisted thoracoscopic     surgery and right thoracotomy as well as right lower lobectomy. Dx  Stage Ib non-small-cell lung cancer of the right lower lobe.  rx Chemo / no RT (not in epic)     History of Present Illness  06/23/2018  Pulmonary/ 1st office eval/[facility]  Chief Complaint  Patient presents with  . Pulmonary Consult    Referred by Dr. Arville Care for eval of pulmonary nodule.   "I am only here for my tongue pain" Pt uncooperative in the extreme with efforts to obtain hx or exam, declined getting on exam table and continuously redirected my attention to her tongue   She is ambulatory but very inactive, denies pain or cough   Has advair not using it at all and variably using saba "when I need it"   No obvious day to day or daytime variability or assoc excess/ purulent sputum or mucus plugs or hemoptysis or cp or chest tightness, subjective wheeze or overt sinus or hb symptoms.      No unusual exposure hx or h/o childhood pna/ asthma or knowledge of premature birth.  Current Allergies, Complete Past Medical History, Past Surgical History, Family History, and Social History were reviewed in [employer] record.  ROS  The following are not active complaints unless bolded (pt not forthcoming so not sure this is accurate)  Hoarseness, sore throat, dysphagia, dental problems, itching, sneezing,  nasal congestion or discharge of excess mucus or purulent secretions, ear ache,   fever, chills, sweats, unintended wt loss or wt gain, classically pleuritic or exertional cp,  orthopnea pnd or  arm/hand swelling  or leg swelling, presyncope, palpitations, abdominal pain, anorexia, nausea, vomiting, diarrhea  or change in bowel habits or change in bladder habits, change in stools or change in urine, dysuria, hematuria,  rash, arthralgias, visual complaints, headache, numbness, weakness or ataxia or problems with walking or coordination,  change in mood or  memory.             Past Medical History:  Diagnosis Date  . Anemia   . Cancer [facility]) 2007   lung cancer, right, chemo done  . Complication of anesthesia 2007   slow to awaken  . History of blood transfusion 2010  . Hypertension   . Occasional tremors     Outpatient Medications Prior to Visit  Medication Sig Dispense Refill  . busPIRone (BUSPAR) 10 MG tablet Take 1 tablet by mouth 3 (three) times daily.    Marland Kitchen PROAIR HFA 108 (90 Base) MCG/ACT inhaler Inhale 2 puffs into the lungs every 4 (four) hours as needed.  6  . atenolol (TENORMIN) 25 MG tablet Take 25 mg by mouth every morning.    . calcium carbonate 200 MG capsule Take 250 mg by mouth daily.    . cholecalciferol (VITAMIN D) 1000 UNITS tablet Take 1,000 Units by mouth daily.    . Fluticasone-Salmeterol (ADVAIR) 250-50 MCG/DOSE AEPB Inhale 1 puff into the lungs every 12 (twelve) hours as needed (for breathing).    . hydrocortisone-pramoxine (ANALPRAM-HC) 2.5-1 %  rectal cream PLACE RECTALLY 4 (FOUR) TIMES DAILY. FOR IRRITATED AND PAINFUL HEMORRHOIDS (Patient not taking: Reported on 06/14/2018) 30 g 2  . Multiple Vitamin (MULTIVITAMIN) tablet Take 1 tablet by mouth daily.    . primidone (MYSOLINE) 50 MG tablet Take 25 mg by mouth every morning.     . psyllium (REGULOID) 0.52 G capsule Take 0.52 g by mouth daily. 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       NECK :  without JVD/Nodes/TM/ nl carotid upstrokes bilaterally   LUNGS: no acc muscle use,  Mild barrel  contour chest wall with bilateral  pan exp  wheeze and  without cough on insp or exp maneuver and mod   Hyperresonant  to  percussion bilaterally     CV:  RRR  no s3 or murmur or increase in P2, and no edema   ABD:  No bruits or organomegaly appreciated, bowel sounds nl  MS:   Nl gait/  ext warm without deformities, calf tenderness, cyanosis or clubbing No obvious joint restrictions   SKIN: warm and dry without lesions    NEURO:  alert,   nl sensorium with  no motor or cerebellar deficits apparent- Resting tremor noted         I personally reviewed images and agree with radiology impression as follows:   Chest CT PET  06/21/18 1.4 cm left lower lobe pulmonary nodule with mild metabolic Activity (1.8), suspicious for low-grade bronchogenic carcinoma. No evidence of nodal or distant metastatic disease. Other incidental findings: Severe hepatic steatosis, moderate emphysema, bilateral nephrolithiasis, diverticulosis, and gallbladder sludge. My impression:   LLL nodule is pleural based     Assessment   Solitary pulmonary nodule on lung CT PET  06/21/18 1.4 cm LLL  With SUV 1.8   This could be a low grade primary bronchogenic ca but completely incidental and clearly her main focus is on addressing her tongue problem to the point where I could not actually complete how best to address the SPN  - this is fortunate as it does not appear to be an immediate concern and can be f/u after the tongue issue is resolved.   >>>> Best option is see Dr Lamonte Sakai p tongue rx complete for consideration  for Bx - probably could be done CT guided bx      COPD pfts pending/ active smoker Clinically severe airflow obst with active wheezing and not adherent with advair or using saba appropriately/ actively smoking  and likely would be turned down for elective surgery at this point but she's so focused on her tongue that I got nowhere with her in terms of what needs to be done to get her ready for surgery from a pulmonary perspective  Actively encouraged her to get a second opinion  on her lung dz but she declined that as well.      Cigarette smoker 4-5 min discussion re active cigarette smoking in addition to office E&M  Ask about tobacco use:   active Advise quitting   Advised likely to be turned down for surgery in present shape and would be better op risk if stops x 2 weeks prior to surgery  Assess willingness:  Not committed at this point Assist in quit attempt:  Per PCP when ready Arrange follow up:   Follow up per Primary Care planned      Total time devoted to counseling  > 50 % of initial 60 min office visit:  review case with pt/daughter  discussion of options/alternatives/ personally creating written customized instructions  in presence of pt  then going over those specific  Instructions directly with the pt including how to use all of the meds but in particular covering each new medication in detail and the difference between the maintenance= "automatic" meds and the prns using an action plan format for the latter (If this problem/symptom => do that organization reading Left to right).  Please see AVS from this visit for a full list of these instructions which I personally wrote for this pt and  are unique to this visit.    F/u with Dr Lamonte Sakai in 3 months re addressing the lung nodule  (see spn)          Christinia Gully, MD 06/23/2018

## 2018-06-24 ENCOUNTER — Encounter: Payer: Self-pay | Admitting: Internal Medicine

## 2018-06-24 DIAGNOSIS — J449 Chronic obstructive pulmonary disease, unspecified: Secondary | ICD-10-CM | POA: Insufficient documentation

## 2018-06-24 DIAGNOSIS — F17210 Nicotine dependence, cigarettes, uncomplicated: Secondary | ICD-10-CM | POA: Insufficient documentation

## 2018-06-24 DIAGNOSIS — R911 Solitary pulmonary nodule: Secondary | ICD-10-CM | POA: Insufficient documentation

## 2018-06-24 NOTE — Assessment & Plan Note (Signed)
4-5 min discussion re active cigarette smoking in addition to office E&M  Ask about tobacco use:   active Advise quitting   Advised likely to be turned down for surgery in present shape and would be better op risk if stops x 2 weeks prior to surgery  Assess willingness:  Not committed at this point Assist in quit attempt:  Per PCP when ready Arrange follow up:   Follow up per Primary Care planned      Total time devoted to counseling  > 50 % of initial 60 min office visit:  review case with pt/daughter  discussion of options/alternatives/ personally creating written customized instructions  in presence of pt  then going over those specific  Instructions directly with the pt including how to use all of the meds but in particular covering each new medication in detail and the difference between the maintenance= "automatic" meds and the prns using an action plan format for the latter (If this problem/symptom => do that organization reading Left to right).  Please see AVS from this visit for a full list of these instructions which I personally wrote for this pt and  are unique to this visit.    F/u with Dr Lamonte Sakai in 3 months re addressing the lung nodule  (see spn)

## 2018-06-24 NOTE — Assessment & Plan Note (Addendum)
PET  06/21/18 1.4 cm LLL  With SUV 1.8   This could be a low grade primary bronchogenic ca but completely incidental and clearly her main focus is on addressing her tongue problem to the point where I could not actually complete how best to address the SPN  - this is fortunate as it does not appear to be an immediate concern and can be f/u after the tongue issue is resolved.   >>>> Best option is see Dr Lamonte Sakai p tongue rx complete for consideration for Bx - probably could be done   CT guided bx

## 2018-06-24 NOTE — Assessment & Plan Note (Signed)
Clinically severe airflow obst with active wheezing and not adherent with advair or using saba appropriately/ actively smoking  and likely would be turned down for elective surgery at this point but she's so focused on her tongue that I got nowhere with her in terms of what needs to be done to get her ready for surgery from a pulmonary perspective  Actively encouraged her to get a second opinion on her lung dz but she declined that as well.

## 2018-06-27 ENCOUNTER — Ambulatory Visit: Payer: PPO

## 2018-06-27 ENCOUNTER — Ambulatory Visit
Admission: RE | Admit: 2018-06-27 | Discharge: 2018-06-27 | Disposition: A | Discharge status: Home / Self Care | Payer: PPO | Source: Ambulatory Visit | Attending: Radiation Oncology | Admitting: Radiation Oncology

## 2018-06-29 NOTE — H&P (Signed)
History of partial pneumonectomy about 10 years ago for lung cancer. She had postop chemotherapy, not sure about radiation. She continues to smoke. She had stopped for several years but started up again a few years ago. She also drinks a couple of glasses of wine every day. She has been losing weight, about 30 pounds over the past few months because of difficulty swallowing and sore tongue. There is no palpable adenopathy. There are 2 small lesions on the right side that are suspicious.  We are going to set her up to visit with Dr. Dorothyann Gibbs, and radiation oncology. She is very difficult to examine and have [place] a severe gag reflex and tremendous pain along the tongue. We will do biopsy and frozen section in the operating room and then proceed with hemiglossectomy. We will do biopsy of the right lesions also. We can proceed with left selective neck dissection at the same setting. She will likely need full mouth extraction as well.  We will set her up for CT of the neck and chest. We will not be able to do PET scan until we have a tissue diagnosis.   Electronically signed by: Frances Maywood, PA-C 05/30/18 1249   Electronically signed by Beckie Salts, MD at 05/30/2018 12:49 PM EST  Back to top of Progress Notes Frances Maywood, PA-C - 05/29/2018 3:10 PM EST Formatting of this note might be different from the original. Otolaryngology Clinic Note  HPI:   Chief Complaint  Patient presents with  . lesion on tongue  patient is here for lesion on tongue since thursday.   [name] is a 67 y.o. female who presents for left tongue and jaw pain for the past four days. This is a new problem. She denies any problems with her mouth prior to this time although she admits to losing 20-30 pounds unintentionally over the past six weeks.  She is having difficulty chewing and swallowing. Is not having difficulty with liquids.   No fever, night sweats, ear pain,  hoarseness or hemoptysis. No swelling noted in her neck.  History of lung cancer and partial right lung resection about 10 years ago. She continues to smoke one pack per day. Drinks about two glasses of wine daily.   She is not taking any anti-coagulant therapy.   No PMH of COPD, diabetes, bleeding disorder, sleep apnea or adverse reaction to anesthesia.  PMH/Meds/All/SocHx/FamHx/ROS:   Past Medical History:  Diagnosis Date  . High cholesterol  . Hypertension  . Lung cancer [facility])   Past Surgical History:  Procedure Laterality Date  . LUNG SURGERY   No family history of bleeding disorders, wound healing problems or difficulty with anesthesia.   Social History   Socioeconomic History  . Marital status: Married  Spouse name: Not on file  . Number of children: Not on file  . Years of education: Not on file  . Highest education level: Not on file  Occupational History  . Not on file  Social Needs  . Financial resource strain: Not on file  . Food insecurity:  Worry: Not on file  Inability: Not on file  . Transportation needs:  Medical: Not on file  Non-medical: Not on file  Tobacco Use  . Smoking status: Current Some Day Smoker  Packs/day: 0.00  Types: Cigarettes  . Smokeless tobacco: Never Used  Substance and Sexual Activity  . Alcohol use: Yes  . Drug use: Not on file  . Sexual activity: Not on file  Lifestyle  . Physical activity:  Days per week: Not on file  Minutes per session: Not on file  . Stress: Not on file  Relationships  . Social connections:  Talks on phone: Not on file  Gets together: Not on file  Attends religious service: Not on file  Active member of club or organization: Not on file  Attends meetings of clubs or organizations: Not on file  Relationship status: Not on file  Other Topics Concern  . Not on file  Social History Narrative  . Not on file   Current Outpatient Medications:  . amoxicillin (AMOXIL) 875 MG tablet, Take 875 mg by  mouth 2 times daily., Disp: , Rfl:  . atorvastatin (LIPITOR) 10 MG tablet, Take 10 mg by mouth daily., Disp: , Rfl:  . busPIRone (BUSPAR) 10 MG tablet, Take 10 mg by mouth 3 times daily., Disp: , Rfl:  . escitalopram oxalate (LEXAPRO) 10 MG tablet, Take 10 mg by mouth daily., Disp: , Rfl:  . MAGIC MOUTHWASH ORAL SUSPENSION (MAGIC MOUTHWASH), Take 15 mLs by mouth 4 times daily., Disp: , Rfl:  . traMADol (ULTRAM) 50 mg tablet, Take 50 mg by mouth every 6 (six) hours as needed., Disp: , Rfl:   A complete ROS was performed with pertinent positives/negatives noted in the HPI. The remainder of the ROS are negative.   Physical Exam:   BP 116/87  Pulse 99  Ht 1.664 m (5' 5.5")  Wt 50.8 kg (112 lb)  BMI 18.35 kg/m   General Awake, at baseline alertness during examination.  Eyes No scleral icterus or conjunctival hemorrhage. Globe position appears normal. EOMI.  Right Ear EAC patent, TM intact w/o inflammation. Middle ear well aerated.  Left Ear EAC patent, TM intact w/o inflammation. Middle ear well aerated.  Nose Patent, no polyps or masses seen on anterior rhinoscopy.  Oral cavity Right lateral tongue with two 1cm shallow ulcerations. Left lateral tongue with large 4-5cm tender ulceration. Tongue is firm around the periphery of the ulcer and tender. Teeth in poor repair; several are broken with sharp edges, particularly on the left lower side.  Oropharynx Symmetric tonsils.  Neck No abnormal cervical lymphadenopathy. No thyromegaly. No thyroid masses palpated.  Cardio-vascular No cyanosis.  Pulmonary No audible stridor. Breathing easily with no labor.  Neuro Symmetric facial movement.  Psychiatry Appropriate affect and mood for clinic visit.   Independent Review of Additional Tests or Records:  Medical records.  Procedures:  None  Impression & Plans:  [name] is a 67 y.o. 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 Concerning for carcinoma.   Ordered CT neck and chest with contrast. CBC and Comprehensive metabolic panel ordered.

## 2018-06-30 NOTE — Progress Notes (Signed)
Called pt for pre-op call. When pt answered she stated "Oh I meant to call you all, I just can't have that surgery on the 24th". I asked her if she had called Dr. Janeice Robinson office and she stated she had but had not heard back from them. I told her to continue to try and call them, but I would also call them. I spoke with Olivia Mackie at Dr. Janeice Robinson office and she states at this point she had not heard from Mrs. Fifer, but she would check her messages. I told her that pt told me she was cancelling surgery.

## 2018-07-04 ENCOUNTER — Encounter (HOSPITAL_COMMUNITY): Admission: AD | Payer: Self-pay | Source: Home / Self Care

## 2018-07-04 ENCOUNTER — Inpatient Hospital Stay (HOSPITAL_COMMUNITY): Admission: AD | Admit: 2018-07-04 | Payer: PPO | Source: Home / Self Care | Admitting: Otolaryngology

## 2018-07-04 SURGERY — GLOSSECTOMY
Anesthesia: General | Laterality: Right

## 2018-08-12 ENCOUNTER — Inpatient Hospital Stay (HOSPITAL_COMMUNITY)
Admission: EM | Admit: 2018-08-12 | Discharge: 2018-08-26 | DRG: 483 | Disposition: A | Discharge status: Home Health | Payer: PPO | Attending: Internal Medicine | Admitting: Internal Medicine

## 2018-08-12 ENCOUNTER — Emergency Department (HOSPITAL_COMMUNITY): Payer: PPO

## 2018-08-12 ENCOUNTER — Encounter (HOSPITAL_COMMUNITY): Payer: Self-pay | Admitting: Emergency Medicine

## 2018-08-12 DIAGNOSIS — Z471 Aftercare following joint replacement surgery: Secondary | ICD-10-CM | POA: Diagnosis not present

## 2018-08-12 DIAGNOSIS — Z79899 Other long term (current) drug therapy: Secondary | ICD-10-CM

## 2018-08-12 DIAGNOSIS — E861 Hypovolemia: Secondary | ICD-10-CM | POA: Diagnosis present

## 2018-08-12 DIAGNOSIS — I82409 Acute embolism and thrombosis of unspecified deep veins of unspecified lower extremity: Secondary | ICD-10-CM | POA: Diagnosis not present

## 2018-08-12 DIAGNOSIS — R000 Tachycardia, unspecified: Secondary | ICD-10-CM | POA: Diagnosis present

## 2018-08-12 DIAGNOSIS — F419 Anxiety disorder, unspecified: Secondary | ICD-10-CM | POA: Diagnosis present

## 2018-08-12 DIAGNOSIS — R945 Abnormal results of liver function studies: Secondary | ICD-10-CM

## 2018-08-12 DIAGNOSIS — W19XXXA Unspecified fall, initial encounter: Secondary | ICD-10-CM | POA: Diagnosis present

## 2018-08-12 DIAGNOSIS — E785 Hyperlipidemia, unspecified: Secondary | ICD-10-CM | POA: Diagnosis present

## 2018-08-12 DIAGNOSIS — Z6823 Body mass index (BMI) 23.0-23.9, adult: Secondary | ICD-10-CM

## 2018-08-12 DIAGNOSIS — J449 Chronic obstructive pulmonary disease, unspecified: Secondary | ICD-10-CM | POA: Diagnosis present

## 2018-08-12 DIAGNOSIS — I9589 Other hypotension: Secondary | ICD-10-CM | POA: Diagnosis present

## 2018-08-12 DIAGNOSIS — Z902 Acquired absence of lung [part of]: Secondary | ICD-10-CM

## 2018-08-12 DIAGNOSIS — K7200 Acute and subacute hepatic failure without coma: Secondary | ICD-10-CM | POA: Diagnosis present

## 2018-08-12 DIAGNOSIS — R05 Cough: Secondary | ICD-10-CM | POA: Diagnosis not present

## 2018-08-12 DIAGNOSIS — S3991XA Unspecified injury of abdomen, initial encounter: Secondary | ICD-10-CM | POA: Diagnosis not present

## 2018-08-12 DIAGNOSIS — S40012A Contusion of left shoulder, initial encounter: Secondary | ICD-10-CM | POA: Diagnosis present

## 2018-08-12 DIAGNOSIS — Z716 Tobacco abuse counseling: Secondary | ICD-10-CM

## 2018-08-12 DIAGNOSIS — J690 Pneumonitis due to inhalation of food and vomit: Secondary | ICD-10-CM | POA: Diagnosis present

## 2018-08-12 DIAGNOSIS — E876 Hypokalemia: Secondary | ICD-10-CM | POA: Diagnosis present

## 2018-08-12 DIAGNOSIS — E860 Dehydration: Secondary | ICD-10-CM | POA: Diagnosis present

## 2018-08-12 DIAGNOSIS — R079 Chest pain, unspecified: Secondary | ICD-10-CM | POA: Diagnosis not present

## 2018-08-12 DIAGNOSIS — G250 Essential tremor: Secondary | ICD-10-CM | POA: Diagnosis present

## 2018-08-12 DIAGNOSIS — F17210 Nicotine dependence, cigarettes, uncomplicated: Secondary | ICD-10-CM | POA: Diagnosis present

## 2018-08-12 DIAGNOSIS — F329 Major depressive disorder, single episode, unspecified: Secondary | ICD-10-CM | POA: Diagnosis present

## 2018-08-12 DIAGNOSIS — I10 Essential (primary) hypertension: Secondary | ICD-10-CM | POA: Diagnosis present

## 2018-08-12 DIAGNOSIS — R0902 Hypoxemia: Secondary | ICD-10-CM

## 2018-08-12 DIAGNOSIS — Z809 Family history of malignant neoplasm, unspecified: Secondary | ICD-10-CM

## 2018-08-12 DIAGNOSIS — D539 Nutritional anemia, unspecified: Secondary | ICD-10-CM | POA: Diagnosis present

## 2018-08-12 DIAGNOSIS — J189 Pneumonia, unspecified organism: Secondary | ICD-10-CM

## 2018-08-12 DIAGNOSIS — S42302A Unspecified fracture of shaft of humerus, left arm, initial encounter for closed fracture: Secondary | ICD-10-CM | POA: Diagnosis present

## 2018-08-12 DIAGNOSIS — S199XXA Unspecified injury of neck, initial encounter: Secondary | ICD-10-CM | POA: Diagnosis not present

## 2018-08-12 DIAGNOSIS — I959 Hypotension, unspecified: Secondary | ICD-10-CM | POA: Diagnosis not present

## 2018-08-12 DIAGNOSIS — Z9221 Personal history of antineoplastic chemotherapy: Secondary | ICD-10-CM

## 2018-08-12 DIAGNOSIS — Z91018 Allergy to other foods: Secondary | ICD-10-CM

## 2018-08-12 DIAGNOSIS — F10239 Alcohol dependence with withdrawal, unspecified: Secondary | ICD-10-CM | POA: Diagnosis present

## 2018-08-12 DIAGNOSIS — J9601 Acute respiratory failure with hypoxia: Secondary | ICD-10-CM | POA: Diagnosis not present

## 2018-08-12 DIAGNOSIS — S0990XA Unspecified injury of head, initial encounter: Secondary | ICD-10-CM | POA: Diagnosis not present

## 2018-08-12 DIAGNOSIS — R52 Pain, unspecified: Secondary | ICD-10-CM | POA: Diagnosis not present

## 2018-08-12 DIAGNOSIS — R918 Other nonspecific abnormal finding of lung field: Secondary | ICD-10-CM | POA: Diagnosis not present

## 2018-08-12 DIAGNOSIS — R0602 Shortness of breath: Secondary | ICD-10-CM | POA: Diagnosis not present

## 2018-08-12 DIAGNOSIS — J984 Other disorders of lung: Secondary | ICD-10-CM | POA: Diagnosis not present

## 2018-08-12 DIAGNOSIS — S42202A Unspecified fracture of upper end of left humerus, initial encounter for closed fracture: Secondary | ICD-10-CM | POA: Diagnosis not present

## 2018-08-12 DIAGNOSIS — Z9119 Patient's noncompliance with other medical treatment and regimen: Secondary | ICD-10-CM

## 2018-08-12 DIAGNOSIS — S299XXA Unspecified injury of thorax, initial encounter: Secondary | ICD-10-CM | POA: Diagnosis not present

## 2018-08-12 DIAGNOSIS — Z96612 Presence of left artificial shoulder joint: Secondary | ICD-10-CM | POA: Diagnosis not present

## 2018-08-12 DIAGNOSIS — D509 Iron deficiency anemia, unspecified: Secondary | ICD-10-CM | POA: Diagnosis present

## 2018-08-12 DIAGNOSIS — Z85118 Personal history of other malignant neoplasm of bronchus and lung: Secondary | ICD-10-CM

## 2018-08-12 DIAGNOSIS — E440 Moderate protein-calorie malnutrition: Secondary | ICD-10-CM | POA: Diagnosis present

## 2018-08-12 DIAGNOSIS — Z8249 Family history of ischemic heart disease and other diseases of the circulatory system: Secondary | ICD-10-CM

## 2018-08-12 DIAGNOSIS — R7989 Other specified abnormal findings of blood chemistry: Secondary | ICD-10-CM | POA: Diagnosis present

## 2018-08-12 DIAGNOSIS — R0682 Tachypnea, not elsewhere classified: Secondary | ICD-10-CM

## 2018-08-12 DIAGNOSIS — S42292A Other displaced fracture of upper end of left humerus, initial encounter for closed fracture: Secondary | ICD-10-CM | POA: Diagnosis present

## 2018-08-12 DIAGNOSIS — R059 Cough, unspecified: Secondary | ICD-10-CM

## 2018-08-12 LAB — CBC WITH DIFFERENTIAL/PLATELET
Abs Immature Granulocytes: 0.08 10*3/uL — ABNORMAL HIGH (ref 0.00–0.07)
BASOS ABS: 0 10*3/uL (ref 0.0–0.1)
Basophils Relative: 0 %
Eosinophils Absolute: 0.1 10*3/uL (ref 0.0–0.5)
Eosinophils Relative: 1 %
HCT: 34 % — ABNORMAL LOW (ref 36.0–46.0)
HEMOGLOBIN: 11 g/dL — AB (ref 12.0–15.0)
Immature Granulocytes: 1 %
LYMPHS PCT: 5 %
Lymphs Abs: 0.6 10*3/uL — ABNORMAL LOW (ref 0.7–4.0)
MCH: 35.7 pg — ABNORMAL HIGH (ref 26.0–34.0)
MCHC: 32.4 g/dL (ref 30.0–36.0)
MCV: 110.4 fL — ABNORMAL HIGH (ref 80.0–100.0)
Monocytes Absolute: 1.1 10*3/uL — ABNORMAL HIGH (ref 0.1–1.0)
Monocytes Relative: 9 %
NRBC: 0 % (ref 0.0–0.2)
Neutro Abs: 10.9 10*3/uL — ABNORMAL HIGH (ref 1.7–7.7)
Neutrophils Relative %: 84 %
Platelets: 238 10*3/uL (ref 150–400)
RBC: 3.08 MIL/uL — ABNORMAL LOW (ref 3.87–5.11)
RDW: 12.9 % (ref 11.5–15.5)
WBC: 12.8 10*3/uL — ABNORMAL HIGH (ref 4.0–10.5)

## 2018-08-12 LAB — URINALYSIS, ROUTINE W REFLEX MICROSCOPIC
BACTERIA UA: NONE SEEN
Bilirubin Urine: NEGATIVE
Glucose, UA: NEGATIVE mg/dL
HGB URINE DIPSTICK: NEGATIVE
Ketones, ur: 80 mg/dL — AB
LEUKOCYTES UA: NEGATIVE
Nitrite: NEGATIVE
PROTEIN: 30 mg/dL — AB
Specific Gravity, Urine: 1.019 (ref 1.005–1.030)
pH: 5 (ref 5.0–8.0)

## 2018-08-12 LAB — COMPREHENSIVE METABOLIC PANEL
ALT: 76 U/L — ABNORMAL HIGH (ref 0–44)
AST: 110 U/L — AB (ref 15–41)
Albumin: 3.1 g/dL — ABNORMAL LOW (ref 3.5–5.0)
Alkaline Phosphatase: 105 U/L (ref 38–126)
Anion gap: 16 — ABNORMAL HIGH (ref 5–15)
BILIRUBIN TOTAL: 1.4 mg/dL — AB (ref 0.3–1.2)
BUN: 15 mg/dL (ref 8–23)
CO2: 24 mmol/L (ref 22–32)
Calcium: 8.7 mg/dL — ABNORMAL LOW (ref 8.9–10.3)
Chloride: 100 mmol/L (ref 98–111)
Creatinine, Ser: 0.51 mg/dL (ref 0.44–1.00)
GFR calc Af Amer: >60 mL/min (ref >60)
GFR calc non Af Amer: >60 mL/min (ref >60)
GLUCOSE: 128 mg/dL — AB (ref 70–99)
POTASSIUM: 3.2 mmol/L — AB (ref 3.5–5.1)
SODIUM: 140 mmol/L (ref 135–145)
TOTAL PROTEIN: 6 g/dL — AB (ref 6.5–8.1)

## 2018-08-12 LAB — CK: Total CK: 232 U/L (ref 38–234)

## 2018-08-12 LAB — ETHANOL: Alcohol, Ethyl (B): <10 mg/dL (ref <10)

## 2018-08-12 LAB — I-STAT TROPONIN, ED: Troponin i, poc: 0 ng/mL (ref 0.00–0.08)

## 2018-08-12 LAB — PROTIME-INR
INR: 0.95
Prothrombin Time: 12.6 seconds (ref 11.4–15.2)

## 2018-08-12 LAB — LIPASE, BLOOD: Lipase: 20 U/L (ref 11–51)

## 2018-08-12 MED ORDER — SODIUM CHLORIDE 0.9 % IV BOLUS
1000.0000 mL | Freq: Once | INTRAVENOUS | Status: AC
  Administered 2018-08-12: 1000 mL via INTRAVENOUS

## 2018-08-12 MED ORDER — VANCOMYCIN HCL IN DEXTROSE 1-5 GM/200ML-% IV SOLN
1000.0000 mg | Freq: Once | INTRAVENOUS | Status: AC
  Administered 2018-08-13: 1000 mg via INTRAVENOUS
  Filled 2018-08-12: qty 200

## 2018-08-12 MED ORDER — SODIUM CHLORIDE 0.9 % IV SOLN
2.0000 g | Freq: Once | INTRAVENOUS | Status: AC
  Administered 2018-08-13: 2 g via INTRAVENOUS
  Filled 2018-08-12: qty 2

## 2018-08-12 MED ORDER — ONDANSETRON 4 MG PO TBDP: 4.0000 mg | ORAL_TABLET | Freq: Once | ORAL | Status: DC

## 2018-08-12 MED ORDER — ONDANSETRON HCL 4 MG/2ML IJ SOLN
4.0000 mg | INTRAMUSCULAR | Status: AC
  Administered 2018-08-12: 4 mg via INTRAVENOUS

## 2018-08-12 MED ORDER — METRONIDAZOLE IN NACL 5-0.79 MG/ML-% IV SOLN
500.0000 mg | Freq: Three times a day (TID) | INTRAVENOUS | Status: DC
  Filled 2018-08-12: qty 100

## 2018-08-12 NOTE — ED Triage Notes (Signed)
Pt arrives via EMS from home with reports of a fall on Thursday. Pt has been on the floor since the fall. Pt reports pain to left shoulder. EMS reports multiple wine bottles on the floor and pt reports drinking since the loss of her husband. 100 mcg fentanyl given by EMS

## 2018-08-12 NOTE — ED Notes (Signed)
Ice provided with okay from provider.

## 2018-08-12 NOTE — ED Provider Notes (Signed)
[facility] EMERGENCY DEPARTMENT Provider Note   CSN: [id number] Arrival date & time: 08/12/18  1839     History   Chief Complaint Chief Complaint  Patient presents with  . Fall    HPI [name] is a 68 y.o. female who presents the emergency department chief after fall.  History is given by the patient and EMS.  EMS states that neighbors at her apartment complex heard her yelling for help.  She has apparently been on the ground for the past 2 days after a fall.  The patient is unable to tell me if she passed out or had a mechanical fall however EMS does report that she was surrounded by wine bottles and there is suspicion that she may have fallen while she was drunk.  EMS also reports that she has significant bruising and severe pain in the left upper extremity.  Patient also is covered in excrement and urine after her prolonged stay on the floor.  She denies hip or leg pain.  She arrives in c-collar.  HPI  Past Medical History:  Diagnosis Date  . Anemia   . Cancer [facility]) 2007   lung cancer, right, chemo done  . Complication of anesthesia 2007   slow to awaken  . History of blood transfusion 2010  . Hypertension   . Occasional tremors     Patient Active Problem List   Diagnosis Date Noted  . Alcohol dependence with withdrawal ([place]) 08/13/2018  . Elevated LFTs 08/13/2018  . Hypokalemia 08/13/2018  . Macrocytic anemia 08/13/2018  . Hypotension due to hypovolemia 08/13/2018  . Closed fracture of left humerus 08/13/2018  . Tachycardia   . Closed left humeral fracture 08/12/2018  . Solitary pulmonary nodule on lung CT 06/24/2018  . COPD pfts pending/ active smoker 06/24/2018  . Cigarette smoker 06/24/2018  . Tongue ulceration 06/14/2018  . Personal history of colonic polyps 01/29/2013  . Internal prolapsed hemorrhoids 01/29/2013    Past Surgical History:  Procedure Laterality Date  . COLONOSCOPY WITH PROPOFOL N/A 11/28/2012   Procedure: COLONOSCOPY  WITH PROPOFOL;  Surgeon: Garlan Fair, MD;  Location: [facility] ENDOSCOPY;  Service: Endoscopy;  Laterality: N/A;  . right lung lobectomy  2007     OB History   No obstetric history on file.      Home Medications    Prior to Admission medications   Medication Sig Start Date End Date Taking? Authorizing Provider  atorvastatin (LIPITOR) 10 MG tablet Take 10 mg by mouth daily.   Yes [provider]  busPIRone (BUSPAR) 10 MG tablet Take 10 mg by mouth 3 (three) times daily.    Yes [provider]  escitalopram (LEXAPRO) 10 MG tablet Take 10 mg by mouth daily.   Yes [provider]  hydrocortisone-pramoxine (ANALPRAM-HC) 2.5-1 % rectal cream PLACE RECTALLY 4 (FOUR) TIMES DAILY. FOR IRRITATED AND PAINFUL HEMORRHOIDS Patient taking differently: Place 1 application rectally 4 (four) times daily as needed (for irritated and painful hemorrhoids).    Liston Alba, MD  Multiple Vitamin (MULTIVITAMIN) tablet Take 1 tablet by mouth daily.   Yes [provider]  PROAIR HFA 108 (90 Base) MCG/ACT inhaler Inhale 2 puffs into the lungs every 4 (four) hours as needed. 04/20/18  Yes [provider]    Family History Family History  Problem Relation Age of Onset  . Cancer Mother        bladder  . Heart disease Father   . Diabetes Father  Social History Social History   Tobacco Use  . Smoking status: Current Some Day Smoker    Packs/day: 3.00    Years: 25.00    Pack years: 75.00    Types: Cigarettes  . Smokeless tobacco: Never Used  Substance Use Topics  . Alcohol use: Yes    Alcohol/week: 7.0 standard drinks    Types: 7 Glasses of wine per week    Comment: 1 glass winde daily  . Drug use: No     Allergies   Tape   Review of Systems Review of Systems  Ten systems reviewed and are negative for acute change, except as noted in the HPI.   Physical Exam Updated Vital Signs BP 127/69   Pulse 81   Temp 97.7 F (36.5 C) (Oral)    Resp 19   Ht 5' 4.5" (1.638 m)   Wt 65.1 kg   SpO2 98%   BMI 24.25 kg/m   Physical Exam Vitals signs and nursing note reviewed.  Constitutional:      General: She is not in acute distress.    Appearance: She is well-developed and underweight. She is not diaphoretic.     Interventions: Cervical collar in place.  HENT:     Head: Normocephalic and atraumatic.     Mouth/Throat:     Mouth: Mucous membranes are dry.  Eyes:     General: No scleral icterus.    Conjunctiva/sclera: Conjunctivae normal.  Neck:     Musculoskeletal: Normal range of motion.  Cardiovascular:     Rate and Rhythm: Normal rate and regular rhythm.     Heart sounds: Normal heart sounds. No murmur. No friction rub. No gallop.   Pulmonary:     Effort: Pulmonary effort is normal. No respiratory distress.     Breath sounds: Normal breath sounds.  Abdominal:     General: Bowel sounds are normal. There is no distension.     Palpations: Abdomen is soft. There is no mass.     Tenderness: There is no abdominal tenderness. There is no guarding.  Musculoskeletal:     Left shoulder: She exhibits tenderness and bony tenderness.       Arms:     Comments: Patient without midline spinal tenderness or sacral tenderness. Patient able to move legs without pain.   Skin:    General: Skin is warm and dry.     Comments: Phimosis and purpuric changes over the arms and lower extremities  Neurological:     Mental Status: She is alert and oriented to person, place, and time.  Psychiatric:        Behavior: Behavior normal.      ED Treatments / Results  Labs (all labs ordered are listed, but only abnormal results are displayed) Labs Reviewed  COMPREHENSIVE METABOLIC PANEL - Abnormal; Notable for the following components:      Result Value   Potassium 3.2 (*)    Glucose, Bld 128 (*)    Calcium 8.7 (*)    Total Protein 6.0 (*)    Albumin 3.1 (*)    AST 110 (*)    ALT 76 (*)    Total Bilirubin 1.4 (*)    Anion gap 16 (*)     All other components within normal limits  CBC WITH DIFFERENTIAL/PLATELET - Abnormal; Notable for the following components:   WBC 12.8 (*)    RBC 3.08 (*)    Hemoglobin 11.0 (*)    HCT 34.0 (*)    MCV 110.4 (*)  MCH 35.7 (*)    Neutro Abs 10.9 (*)    Lymphs Abs 0.6 (*)    Monocytes Absolute 1.1 (*)    Abs Immature Granulocytes 0.08 (*)    All other components within normal limits  URINALYSIS, ROUTINE W REFLEX MICROSCOPIC - Abnormal; Notable for the following components:   Ketones, ur 80 (*)    Protein, ur 30 (*)    Non Squamous Epithelial 0-5 (*)    All other components within normal limits  COMPREHENSIVE METABOLIC PANEL - Abnormal; Notable for the following components:   Potassium 2.9 (*)    Chloride 113 (*)    CO2 21 (*)    Creatinine, Ser 0.35 (*)    Calcium 6.4 (*)    Total Protein 3.7 (*)    Albumin 1.9 (*)    AST 62 (*)    ALT 45 (*)    All other components within normal limits  CBC WITH DIFFERENTIAL/PLATELET - Abnormal; Notable for the following components:   RBC 2.11 (*)    Hemoglobin 7.5 (*)    HCT 24.1 (*)    MCV 114.2 (*)    MCH 35.5 (*)    Lymphs Abs 0.6 (*)    All other components within normal limits  MAGNESIUM - Abnormal; Notable for the following components:   Magnesium 1.0 (*)    All other components within normal limits  BASIC METABOLIC PANEL - Abnormal; Notable for the following components:   BUN 7 (*)    Creatinine, Ser 0.37 (*)    Calcium 7.7 (*)    All other components within normal limits  CBC - Abnormal; Notable for the following components:   RBC 2.84 (*)    Hemoglobin 9.8 (*)    HCT 30.8 (*)    MCV 108.5 (*)    MCH 34.5 (*)    RDW 16.7 (*)    All other components within normal limits  GLUCOSE, CAPILLARY - Abnormal; Notable for the following components:   Glucose-Capillary 65 (*)    All other components within normal limits  GLUCOSE, CAPILLARY - Abnormal; Notable for the following components:   Glucose-Capillary 121 (*)    All  other components within normal limits  CULTURE, BLOOD (ROUTINE X 2)  CULTURE, BLOOD (ROUTINE X 2)  MRSA PCR SCREENING  PROTIME-INR  CK  ETHANOL  LIPASE, BLOOD  LACTIC ACID, PLASMA  HIV ANTIBODY (ROUTINE TESTING W REFLEX)  CORTISOL  MAGNESIUM  COMPREHENSIVE METABOLIC PANEL  CBC  VITAMIN B12  FOLATE  IRON AND TIBC  FERRITIN  RETICULOCYTES  MAGNESIUM  PHOSPHORUS  I-STAT TROPONIN, ED  POC OCCULT BLOOD, ED  TYPE AND SCREEN  PREPARE RBC (CROSSMATCH)  ABO/RH    EKG EKG Interpretation  Date/Time:  Saturday August 12 2018 18:50:03 EST Ventricular Rate:  110 PR Interval:    QRS Duration: 79 QT Interval:  346 QTC Calculation: 471 R Axis:   105 Text Interpretation:  Sinus tachycardia Ventricular premature complex Aberrant conduction of SV complex(es) Biatrial enlargement Anteroseptal infarct, age indeterminate Confirmed by Gerlene Fee [phone]) on 08/12/2018 9:38:23 PM Also confirmed by Gerlene Fee [phone]), editor Shon Hale [phone])  on 08/13/2018 9:30:49 AM   Radiology Ct Head Wo Contrast  Result Date: 08/12/2018 CLINICAL DATA:  Fall on Thursday EXAM: CT HEAD WITHOUT CONTRAST CT CERVICAL SPINE WITHOUT CONTRAST TECHNIQUE: Multidetector CT imaging of the head and cervical spine was performed following the standard protocol without intravenous contrast. Multiplanar CT image reconstructions of the cervical spine were also generated. COMPARISON:  CT brain 06/06/2018 FINDINGS: CT HEAD FINDINGS Brain: No acute territorial infarction, hemorrhage, or intracranial mass. Mild to moderate atrophy. Mild small vessel ischemic changes of the white matter. Stable ventricle size. Vascular: No hyperdense vessels. Scattered calcifications at the carotid siphon. Skull: No fracture Sinuses/Orbits: No acute finding. Other: None CT CERVICAL SPINE FINDINGS Alignment: No subluxation.  Facet alignment is normal Skull base and vertebrae: No acute fracture. No primary bone lesion or focal pathologic  process. Soft tissues and spinal canal: No prevertebral fluid or swelling. No visible canal hematoma. Disc levels:  Mild degenerative changes C4-C5. Upper chest: Negative. Other: Apical emphysema. IMPRESSION: 1. No CT evidence for acute intracranial abnormality. Atrophy and mild small vessel ischemic changes of the white matter. 2. Mild degenerative changes.  No acute osseous abnormality. Electronically Signed   By: Donavan Foil M.D.   On: 08/12/2018 20:44   Ct Chest W Contrast  Result Date: 08/13/2018 CLINICAL DATA:  Fall. EXAM: CT CHEST, ABDOMEN, AND PELVIS WITH CONTRAST TECHNIQUE: Multidetector CT imaging of the chest, abdomen and pelvis was performed following the standard protocol during bolus administration of intravenous contrast. CONTRAST:  148mL OMNIPAQUE IOHEXOL 300 MG/ML  SOLN COMPARISON:  PET-CT 06/21/2018 FINDINGS: CT CHEST FINDINGS Cardiovascular: Normal heart size. No pericardial effusions. Normal caliber thoracic aorta. No dissection. Great vessel origins are patent. Central pulmonary arteries are well opacified. Mediastinum/Nodes: Esophagus is decompressed. No significant lymphadenopathy in the chest. Surgical clips in the right hilum and right para esophageal region. Lungs/Pleura: Emphysematous changes in the lungs. Postoperative partial right lung resection with scarring in the posteromedial right lung, possibly postoperative or possibly post radiation. No consolidation or edema. No pleural effusions. No pneumothorax. There is a somewhat lobulated nodule in the left lower lung posteriorly, measuring 12 mm diameter. Size is similar to previous PET-CT scan. The PET CT suggested low metabolic activity suspicious for a low-grade tumor. No new lesions. Musculoskeletal: Acute appearing comminuted fractures of the left humeral head and neck with displacement of the fracture fragments. No glenohumeral dislocation. Prominent surrounding soft tissue edema and/or contusion. Old fracture deformity of the  right shoulder. No acute displaced rib fractures are demonstrated. Degenerative changes in the spine. Thoracic scoliosis convex towards the right. CT ABDOMEN PELVIS FINDINGS Hepatobiliary: Diffuse fatty infiltration of the liver. No focal lesions. Gallbladder and bile ducts are unremarkable. Pancreas: Unremarkable. No pancreatic ductal dilatation or surrounding inflammatory changes. Spleen: Normal in size without focal abnormality. Adrenals/Urinary Tract: No adrenal gland nodules. Several stones in the lower poles of both kidneys. Largest is on the left and measures about 3 mm diameter. No hydronephrosis or hydroureter. Nephrograms are symmetrical. Bladder is unremarkable. Stomach/Bowel: Stomach, small bowel, and colon are not abnormally distended. Diverticulosis throughout the colon without evidence of diverticulitis. Scattered stool throughout the colon. Appendix is normal. Vascular/Lymphatic: Aortic atherosclerosis. No enlarged abdominal or pelvic lymph nodes. Reproductive: Uterus and bilateral adnexa are unremarkable. Other: No abdominal wall hernia or abnormality. No abdominopelvic ascites. Musculoskeletal: Lumbar scoliosis convex towards the left. Degenerative changes in the spine. No destructive bone lesions. IMPRESSION: 1. Acute appearing comminuted fractures of the left humeral head and neck with surrounding soft tissue edema and/or contusion. 2. 12 mm nodule in the left lower lung is similar to previous PET-CT scan. See PET-CT report, suggesting possible low-grade malignancy. 3. Postoperative changes in the right lung. No evidence of active pulmonary disease. 4. Diffuse fatty infiltration of the liver. 5. Nonobstructing stones in both kidneys. 6. Colonic diverticulosis without evidence of diverticulitis. Aortic Atherosclerosis (ICD10-I70.0)  and Emphysema (ICD10-J43.9). Electronically Signed   By: Lucienne Capers M.D.   On: 08/13/2018 02:39   Ct Cervical Spine Wo Contrast  Result Date:  08/12/2018 CLINICAL DATA:  Fall on Thursday EXAM: CT HEAD WITHOUT CONTRAST CT CERVICAL SPINE WITHOUT CONTRAST TECHNIQUE: Multidetector CT imaging of the head and cervical spine was performed following the standard protocol without intravenous contrast. Multiplanar CT image reconstructions of the cervical spine were also generated. COMPARISON:  CT brain 06/06/2018 FINDINGS: CT HEAD FINDINGS Brain: No acute territorial infarction, hemorrhage, or intracranial mass. Mild to moderate atrophy. Mild small vessel ischemic changes of the white matter. Stable ventricle size. Vascular: No hyperdense vessels. Scattered calcifications at the carotid siphon. Skull: No fracture Sinuses/Orbits: No acute finding. Other: None CT CERVICAL SPINE FINDINGS Alignment: No subluxation.  Facet alignment is normal Skull base and vertebrae: No acute fracture. No primary bone lesion or focal pathologic process. Soft tissues and spinal canal: No prevertebral fluid or swelling. No visible canal hematoma. Disc levels:  Mild degenerative changes C4-C5. Upper chest: Negative. Other: Apical emphysema. IMPRESSION: 1. No CT evidence for acute intracranial abnormality. Atrophy and mild small vessel ischemic changes of the white matter. 2. Mild degenerative changes.  No acute osseous abnormality. Electronically Signed   By: Donavan Foil M.D.   On: 08/12/2018 20:44   Ct Abdomen Pelvis W Contrast  Result Date: 08/13/2018 CLINICAL DATA:  Fall. EXAM: CT CHEST, ABDOMEN, AND PELVIS WITH CONTRAST TECHNIQUE: Multidetector CT imaging of the chest, abdomen and pelvis was performed following the standard protocol during bolus administration of intravenous contrast. CONTRAST:  129mL OMNIPAQUE IOHEXOL 300 MG/ML  SOLN COMPARISON:  PET-CT 06/21/2018 FINDINGS: CT CHEST FINDINGS Cardiovascular: Normal heart size. No pericardial effusions. Normal caliber thoracic aorta. No dissection. Great vessel origins are patent. Central pulmonary arteries are well opacified.  Mediastinum/Nodes: Esophagus is decompressed. No significant lymphadenopathy in the chest. Surgical clips in the right hilum and right para esophageal region. Lungs/Pleura: Emphysematous changes in the lungs. Postoperative partial right lung resection with scarring in the posteromedial right lung, possibly postoperative or possibly post radiation. No consolidation or edema. No pleural effusions. No pneumothorax. There is a somewhat lobulated nodule in the left lower lung posteriorly, measuring 12 mm diameter. Size is similar to previous PET-CT scan. The PET CT suggested low metabolic activity suspicious for a low-grade tumor. No new lesions. Musculoskeletal: Acute appearing comminuted fractures of the left humeral head and neck with displacement of the fracture fragments. No glenohumeral dislocation. Prominent surrounding soft tissue edema and/or contusion. Old fracture deformity of the right shoulder. No acute displaced rib fractures are demonstrated. Degenerative changes in the spine. Thoracic scoliosis convex towards the right. CT ABDOMEN PELVIS FINDINGS Hepatobiliary: Diffuse fatty infiltration of the liver. No focal lesions. Gallbladder and bile ducts are unremarkable. Pancreas: Unremarkable. No pancreatic ductal dilatation or surrounding inflammatory changes. Spleen: Normal in size without focal abnormality. Adrenals/Urinary Tract: No adrenal gland nodules. Several stones in the lower poles of both kidneys. Largest is on the left and measures about 3 mm diameter. No hydronephrosis or hydroureter. Nephrograms are symmetrical. Bladder is unremarkable. Stomach/Bowel: Stomach, small bowel, and colon are not abnormally distended. Diverticulosis throughout the colon without evidence of diverticulitis. Scattered stool throughout the colon. Appendix is normal. Vascular/Lymphatic: Aortic atherosclerosis. No enlarged abdominal or pelvic lymph nodes. Reproductive: Uterus and bilateral adnexa are unremarkable. Other: No  abdominal wall hernia or abnormality. No abdominopelvic ascites. Musculoskeletal: Lumbar scoliosis convex towards the left. Degenerative changes in the spine. No destructive bone lesions.  IMPRESSION: 1. Acute appearing comminuted fractures of the left humeral head and neck with surrounding soft tissue edema and/or contusion. 2. 12 mm nodule in the left lower lung is similar to previous PET-CT scan. See PET-CT report, suggesting possible low-grade malignancy. 3. Postoperative changes in the right lung. No evidence of active pulmonary disease. 4. Diffuse fatty infiltration of the liver. 5. Nonobstructing stones in both kidneys. 6. Colonic diverticulosis without evidence of diverticulitis. Aortic Atherosclerosis (ICD10-I70.0) and Emphysema (ICD10-J43.9). Electronically Signed   By: Lucienne Capers M.D.   On: 08/13/2018 02:39   Dg Chest Port 1 View  Result Date: 08/13/2018 CLINICAL DATA:  Tachypnea. EXAM: PORTABLE CHEST 1 VIEW COMPARISON:  Chest radiograph 08/12/2018 and CT 08/13/2018 FINDINGS: The cardiomediastinal silhouette is unchanged. Postoperative changes are again seen from right lower lobectomy and right upper lobe wedge resection with chronic volume loss in the right hemithorax and elevation of the right hemidiaphragm. There is underlying emphysema. Right basilar atelectasis or scarring is noted. There is new mild heterogeneous opacity in the left lung base. No definite pleural effusion or pneumothorax is identified. A proximal left humerus fracture is again noted, and there is thoracolumbar scoliosis. IMPRESSION: New left lung base opacity which could reflect pneumonia or aspiration and atelectasis. Electronically Signed   By: Logan Bores M.D.   On: 08/13/2018 16:25   Dg Chest Port 1 View  Result Date: 08/12/2018 CLINICAL DATA:  Acute RIGHT chest pain following fall. EXAM: PORTABLE CHEST 1 VIEW COMPARISON:  06/21/2018 PET CT, 10/13/2017 chest radiograph and other studies FINDINGS: The  cardiomediastinal silhouette is unchanged. RIGHT thoracic surgical changes and scarring again noted. There is no evidence of focal airspace disease, pulmonary edema, suspicious pulmonary nodule/mass, pleural effusion, or pneumothorax. No acute bony abnormalities are identified. Thoracic scoliosis again identified. IMPRESSION: No evidence of acute cardiopulmonary disease. Electronically Signed   By: Margarette Canada M.D.   On: 08/12/2018 19:48   Dg Shoulder Left  Result Date: 08/12/2018 CLINICAL DATA:  Acute LEFT shoulder pain following fall. Initial encounter. EXAM: LEFT SHOULDER - 2+ VIEW COMPARISON:  None. FINDINGS: A humeral neck fracture is identified with equivocal extension into the head. Apex MEDIAL/anterior angulation is noted. The humeral head appears located. IMPRESSION: Angulated humeral neck fracture which may extend into the head. Electronically Signed   By: Margarette Canada M.D.   On: 08/12/2018 19:50    Procedures Procedures (including critical care time)  Medications Ordered in ED Medications  sodium chloride flush (NS) 0.9 % injection 3 mL (3 mLs Intravenous Given 08/13/18 2106)  acetaminophen (TYLENOL) suppository 650 mg (has no administration in time range)  ondansetron (ZOFRAN) tablet 4 mg ( Oral See Alternative 08/13/18 0411)    Or  ondansetron (ZOFRAN) injection 4 mg (4 mg Intravenous Given 08/13/18 0411)  LORazepam (ATIVAN) injection 2-3 mg (2 mg Intravenous Given 01/16/45 9629)  folic acid injection 1 mg (1 mg Intravenous Given 08/13/18 1347)  thiamine (B-1) injection 100 mg (100 mg Intravenous Given 08/13/18 1347)  MEDLINE mouth rinse (15 mLs Mouth Rinse Given 08/13/18 2109)  albuterol (PROVENTIL) (2.5 MG/3ML) 0.083% nebulizer solution 2.5 mg (has no administration in time range)  0.9 % NaCl with KCl 20 mEq/ L  infusion ( Intravenous New Bag/Given 08/13/18 1533)  fentaNYL (SUBLIMAZE) injection 25-50 mcg (has no administration in time range)  oxyCODONE (Oxy IR/ROXICODONE) immediate release  tablet 5-10 mg (10 mg Oral Given 08/13/18 1528)  Ampicillin-Sulbactam (UNASYN) 3 g in sodium chloride 0.9 % 100 mL IVPB (3 g Intravenous  New Bag/Given 08/13/18 2100)  metoprolol tartrate (LOPRESSOR) injection 2.5 mg (2.5 mg Intravenous Given 08/13/18 2104)  sodium chloride 0.9 % bolus 1,000 mL (0 mLs Intravenous Stopped 08/12/18 2150)  ondansetron (ZOFRAN) injection 4 mg (4 mg Intravenous Given 08/12/18 2122)  sodium chloride 0.9 % bolus 1,000 mL (0 mLs Intravenous Stopped 08/12/18 2252)  sodium chloride 0.9 % bolus 1,000 mL (0 mLs Intravenous Stopped 08/13/18 0028)  ceFEPIme (MAXIPIME) 2 g in sodium chloride 0.9 % 100 mL IVPB (0 g Intravenous Stopped 08/13/18 0035)  vancomycin (VANCOCIN) IVPB 1000 mg/200 mL premix (0 mg Intravenous Stopped 08/13/18 0125)  sodium chloride 0.9 % 1,000 mL with thiamine 299 mg, folic acid 1 mg, multivitamins adult 10 mL infusion ( Intravenous Stopped 08/13/18 1243)  sodium chloride 0.9 % bolus 1,000 mL (0 mLs Intravenous Stopped 08/13/18 0146)  sodium chloride 0.9 % bolus 500 mL (0 mLs Intravenous Stopped 08/13/18 0242)  iohexol (OMNIPAQUE) 300 MG/ML solution 100 mL (100 mLs Intravenous Contrast Given 08/13/18 0158)  sodium chloride 0.9 % bolus 1,000 mL (0 mLs Intravenous Stopped 08/13/18 0342)  0.9 %  sodium chloride infusion (Manually program via Guardrails IV Fluids) ( Intravenous Stopped 08/13/18 0900)  potassium chloride 10 mEq in 100 mL IVPB (0 mEq Intravenous Stopped 08/13/18 1343)  magnesium sulfate IVPB 2 g 50 mL (0 g Intravenous Stopped 08/13/18 0600)  potassium chloride SA (K-DUR,KLOR-CON) CR tablet 20 mEq (20 mEq Oral Given 08/13/18 1346)  furosemide (LASIX) injection 20 mg (20 mg Intravenous Given 08/13/18 0626)  magnesium sulfate IVPB 2 g 50 mL (0 g Intravenous Stopped 08/13/18 1638)  potassium chloride 10 mEq in 100 mL IVPB (10 mEq Intravenous New Bag/Given 08/13/18 1927)  albumin human 5 % solution 25 g (25 g Intravenous New Bag/Given 08/13/18 2159)  dextrose 50 % solution 50 mL (50 mLs  Intravenous Given 08/13/18 2109)     Initial Impression / Assessment and Plan / ED Course  I have reviewed the triage vital signs and the nursing notes.  Pertinent labs & imaging results that were available during my care of the patient were reviewed by me and considered in my medical decision making (see chart for details).  Clinical Course as of Aug 13 2314  Sat Aug 12, 2018  1949 Glucose(!): 128 [AH]  1949 AST(!): 110 [AH]  1949 ALT(!): 76 [AH]  1949 Albumin(!): 3.1 [AH]  Sun Aug 13, 2018  0003 I spoke with Dr. Veverly Fells who will consult on the patient tomorrow morning.    [AH]    Clinical Course User Index [AH] Margarita Mail, PA-C    With proximal humerus fracture, persistent hypotension and tachycardia despite 3 L of fluid.  Now going on her fourth liter.  Patient was admitted to the stepdown unit with Dr. Myna Hidalgo however considering her persistent hypotension further evaluation will be performed for other source of bleeding.  Patient will get trauma scans of the chest abdomen and pelvis.  I have given sign out to Dr. Betsey Holiday who will follow-up on her imaging. Final Clinical Impressions(s) / ED Diagnoses   Final diagnoses:  Other closed displaced fracture of proximal end of left humerus, initial encounter  Hypotension, unspecified hypotension type  Tachycardia    ED Discharge Orders    None       Margarita Mail, PA-C 08/13/18 2318    Maudie Flakes, MD 08/14/18 [phone]

## 2018-08-13 ENCOUNTER — Inpatient Hospital Stay (HOSPITAL_COMMUNITY): Payer: PPO

## 2018-08-13 ENCOUNTER — Observation Stay (HOSPITAL_COMMUNITY): Payer: PPO

## 2018-08-13 ENCOUNTER — Encounter (HOSPITAL_COMMUNITY): Payer: Self-pay | Admitting: Family Medicine

## 2018-08-13 DIAGNOSIS — R945 Abnormal results of liver function studies: Secondary | ICD-10-CM

## 2018-08-13 DIAGNOSIS — E861 Hypovolemia: Secondary | ICD-10-CM

## 2018-08-13 DIAGNOSIS — S42302A Unspecified fracture of shaft of humerus, left arm, initial encounter for closed fracture: Secondary | ICD-10-CM | POA: Diagnosis present

## 2018-08-13 DIAGNOSIS — J690 Pneumonitis due to inhalation of food and vomit: Secondary | ICD-10-CM | POA: Diagnosis present

## 2018-08-13 DIAGNOSIS — D539 Nutritional anemia, unspecified: Secondary | ICD-10-CM

## 2018-08-13 DIAGNOSIS — R000 Tachycardia, unspecified: Secondary | ICD-10-CM | POA: Insufficient documentation

## 2018-08-13 DIAGNOSIS — J449 Chronic obstructive pulmonary disease, unspecified: Secondary | ICD-10-CM

## 2018-08-13 DIAGNOSIS — F419 Anxiety disorder, unspecified: Secondary | ICD-10-CM | POA: Diagnosis present

## 2018-08-13 DIAGNOSIS — S42202A Unspecified fracture of upper end of left humerus, initial encounter for closed fracture: Secondary | ICD-10-CM | POA: Diagnosis not present

## 2018-08-13 DIAGNOSIS — S42292A Other displaced fracture of upper end of left humerus, initial encounter for closed fracture: Secondary | ICD-10-CM | POA: Diagnosis present

## 2018-08-13 DIAGNOSIS — S3991XA Unspecified injury of abdomen, initial encounter: Secondary | ICD-10-CM | POA: Diagnosis not present

## 2018-08-13 DIAGNOSIS — I9589 Other hypotension: Secondary | ICD-10-CM

## 2018-08-13 DIAGNOSIS — S40012A Contusion of left shoulder, initial encounter: Secondary | ICD-10-CM | POA: Diagnosis present

## 2018-08-13 DIAGNOSIS — I10 Essential (primary) hypertension: Secondary | ICD-10-CM | POA: Diagnosis present

## 2018-08-13 DIAGNOSIS — E876 Hypokalemia: Secondary | ICD-10-CM | POA: Diagnosis present

## 2018-08-13 DIAGNOSIS — E440 Moderate protein-calorie malnutrition: Secondary | ICD-10-CM | POA: Diagnosis present

## 2018-08-13 DIAGNOSIS — R7989 Other specified abnormal findings of blood chemistry: Secondary | ICD-10-CM | POA: Diagnosis present

## 2018-08-13 DIAGNOSIS — K7200 Acute and subacute hepatic failure without coma: Secondary | ICD-10-CM | POA: Diagnosis present

## 2018-08-13 DIAGNOSIS — Z9221 Personal history of antineoplastic chemotherapy: Secondary | ICD-10-CM | POA: Diagnosis not present

## 2018-08-13 DIAGNOSIS — F329 Major depressive disorder, single episode, unspecified: Secondary | ICD-10-CM | POA: Diagnosis present

## 2018-08-13 DIAGNOSIS — E785 Hyperlipidemia, unspecified: Secondary | ICD-10-CM | POA: Diagnosis present

## 2018-08-13 DIAGNOSIS — F10239 Alcohol dependence with withdrawal, unspecified: Secondary | ICD-10-CM | POA: Diagnosis present

## 2018-08-13 DIAGNOSIS — E860 Dehydration: Secondary | ICD-10-CM | POA: Diagnosis present

## 2018-08-13 DIAGNOSIS — Z716 Tobacco abuse counseling: Secondary | ICD-10-CM | POA: Diagnosis not present

## 2018-08-13 DIAGNOSIS — S299XXA Unspecified injury of thorax, initial encounter: Secondary | ICD-10-CM | POA: Diagnosis not present

## 2018-08-13 DIAGNOSIS — J9601 Acute respiratory failure with hypoxia: Secondary | ICD-10-CM | POA: Diagnosis not present

## 2018-08-13 DIAGNOSIS — G250 Essential tremor: Secondary | ICD-10-CM | POA: Diagnosis present

## 2018-08-13 DIAGNOSIS — I959 Hypotension, unspecified: Secondary | ICD-10-CM | POA: Diagnosis present

## 2018-08-13 DIAGNOSIS — D509 Iron deficiency anemia, unspecified: Secondary | ICD-10-CM | POA: Diagnosis present

## 2018-08-13 DIAGNOSIS — F17210 Nicotine dependence, cigarettes, uncomplicated: Secondary | ICD-10-CM | POA: Diagnosis present

## 2018-08-13 DIAGNOSIS — I82409 Acute embolism and thrombosis of unspecified deep veins of unspecified lower extremity: Secondary | ICD-10-CM | POA: Diagnosis not present

## 2018-08-13 DIAGNOSIS — W19XXXA Unspecified fall, initial encounter: Secondary | ICD-10-CM | POA: Diagnosis present

## 2018-08-13 LAB — CBC WITH DIFFERENTIAL/PLATELET
Abs Immature Granulocytes: 0.04 10*3/uL (ref 0.00–0.07)
BASOS PCT: 0 %
Basophils Absolute: 0 10*3/uL (ref 0.0–0.1)
Eosinophils Absolute: 0 10*3/uL (ref 0.0–0.5)
Eosinophils Relative: 0 %
HCT: 24.1 % — ABNORMAL LOW (ref 36.0–46.0)
Hemoglobin: 7.5 g/dL — ABNORMAL LOW (ref 12.0–15.0)
Immature Granulocytes: 1 %
Lymphocytes Relative: 9 %
Lymphs Abs: 0.6 10*3/uL — ABNORMAL LOW (ref 0.7–4.0)
MCH: 35.5 pg — ABNORMAL HIGH (ref 26.0–34.0)
MCHC: 31.1 g/dL (ref 30.0–36.0)
MCV: 114.2 fL — ABNORMAL HIGH (ref 80.0–100.0)
Monocytes Absolute: 0.8 10*3/uL (ref 0.1–1.0)
Monocytes Relative: 11 %
Neutro Abs: 5.8 10*3/uL (ref 1.7–7.7)
Neutrophils Relative %: 79 %
Platelets: 167 10*3/uL (ref 150–400)
RBC: 2.11 MIL/uL — ABNORMAL LOW (ref 3.87–5.11)
RDW: 13.1 % (ref 11.5–15.5)
WBC: 7.4 10*3/uL (ref 4.0–10.5)
nRBC: 0 % (ref 0.0–0.2)

## 2018-08-13 LAB — MAGNESIUM
MAGNESIUM: 1 mg/dL — AB (ref 1.7–2.4)
Magnesium: 2.1 mg/dL (ref 1.7–2.4)

## 2018-08-13 LAB — COMPREHENSIVE METABOLIC PANEL
ALT: 45 U/L — ABNORMAL HIGH (ref 0–44)
ANION GAP: 6 (ref 5–15)
AST: 62 U/L — ABNORMAL HIGH (ref 15–41)
Albumin: 1.9 g/dL — ABNORMAL LOW (ref 3.5–5.0)
Alkaline Phosphatase: 63 U/L (ref 38–126)
BUN: 11 mg/dL (ref 8–23)
CALCIUM: 6.4 mg/dL — AB (ref 8.9–10.3)
CO2: 21 mmol/L — ABNORMAL LOW (ref 22–32)
Chloride: 113 mmol/L — ABNORMAL HIGH (ref 98–111)
Creatinine, Ser: 0.35 mg/dL — ABNORMAL LOW (ref 0.44–1.00)
GFR calc Af Amer: >60 mL/min (ref >60)
GFR calc non Af Amer: >60 mL/min (ref >60)
Glucose, Bld: 88 mg/dL (ref 70–99)
Potassium: 2.9 mmol/L — ABNORMAL LOW (ref 3.5–5.1)
Sodium: 140 mmol/L (ref 135–145)
Total Bilirubin: 0.9 mg/dL (ref 0.3–1.2)
Total Protein: 3.7 g/dL — ABNORMAL LOW (ref 6.5–8.1)

## 2018-08-13 LAB — CBC
HCT: 30.8 % — ABNORMAL LOW (ref 36.0–46.0)
Hemoglobin: 9.8 g/dL — ABNORMAL LOW (ref 12.0–15.0)
MCH: 34.5 pg — ABNORMAL HIGH (ref 26.0–34.0)
MCHC: 31.8 g/dL (ref 30.0–36.0)
MCV: 108.5 fL — ABNORMAL HIGH (ref 80.0–100.0)
NRBC: 0 % (ref 0.0–0.2)
Platelets: 177 10*3/uL (ref 150–400)
RBC: 2.84 MIL/uL — ABNORMAL LOW (ref 3.87–5.11)
RDW: 16.7 % — ABNORMAL HIGH (ref 11.5–15.5)
WBC: 8.2 10*3/uL (ref 4.0–10.5)

## 2018-08-13 LAB — BASIC METABOLIC PANEL
Anion gap: 6 (ref 5–15)
BUN: 7 mg/dL — ABNORMAL LOW (ref 8–23)
CALCIUM: 7.7 mg/dL — AB (ref 8.9–10.3)
CO2: 23 mmol/L (ref 22–32)
CREATININE: 0.37 mg/dL — AB (ref 0.44–1.00)
Chloride: 109 mmol/L (ref 98–111)
GFR calc non Af Amer: >60 mL/min (ref >60)
Glucose, Bld: 72 mg/dL (ref 70–99)
Potassium: 4.3 mmol/L (ref 3.5–5.1)
Sodium: 138 mmol/L (ref 135–145)

## 2018-08-13 LAB — LACTIC ACID, PLASMA: Lactic Acid, Venous: 0.9 mmol/L (ref 0.5–1.9)

## 2018-08-13 LAB — GLUCOSE, CAPILLARY
GLUCOSE-CAPILLARY: 65 mg/dL — AB (ref 70–99)
Glucose-Capillary: 121 mg/dL — ABNORMAL HIGH (ref 70–99)

## 2018-08-13 LAB — CORTISOL: Cortisol, Plasma: 12.2 ug/dL

## 2018-08-13 LAB — HIV ANTIBODY (ROUTINE TESTING W REFLEX): HIV Screen 4th Generation wRfx: NONREACTIVE

## 2018-08-13 LAB — ABO/RH: ABO/RH(D): O NEG

## 2018-08-13 LAB — POC OCCULT BLOOD, ED: Fecal Occult Bld: NEGATIVE

## 2018-08-13 LAB — MRSA PCR SCREENING: MRSA by PCR: NEGATIVE

## 2018-08-13 LAB — PREPARE RBC (CROSSMATCH)

## 2018-08-13 MED ORDER — ORAL CARE MOUTH RINSE
15.0000 mL | Freq: Two times a day (BID) | OROMUCOSAL | Status: DC
  Administered 2018-08-13 – 2018-08-25 (×19): 15 mL via OROMUCOSAL

## 2018-08-13 MED ORDER — FOLIC ACID 5 MG/ML IJ SOLN
1.0000 mg | Freq: Every day | INTRAMUSCULAR | Status: DC
  Administered 2018-08-13: 1 mg via INTRAVENOUS
  Filled 2018-08-13 (×4): qty 0.2

## 2018-08-13 MED ORDER — BUSPIRONE HCL 5 MG PO TABS
10.0000 mg | ORAL_TABLET | Freq: Three times a day (TID) | ORAL | Status: DC
  Administered 2018-08-13: 10 mg via ORAL
  Filled 2018-08-13: qty 2

## 2018-08-13 MED ORDER — SODIUM CHLORIDE 0.9 % IV BOLUS
1000.0000 mL | Freq: Once | INTRAVENOUS | Status: AC
  Administered 2018-08-13: 1000 mL via INTRAVENOUS

## 2018-08-13 MED ORDER — OXYCODONE HCL 5 MG PO TABS
5.0000 mg | ORAL_TABLET | Freq: Four times a day (QID) | ORAL | Status: DC | PRN
  Administered 2018-08-13 – 2018-08-15 (×4): 10 mg via ORAL
  Filled 2018-08-13 (×4): qty 2

## 2018-08-13 MED ORDER — SODIUM CHLORIDE 0.9% IV SOLUTION
Freq: Once | INTRAVENOUS | Status: AC
  Administered 2018-08-13: 05:00:00 via INTRAVENOUS

## 2018-08-13 MED ORDER — DEXTROSE 50 % IV SOLN
50.0000 mL | Freq: Once | INTRAVENOUS | Status: AC
  Administered 2018-08-13: 50 mL via INTRAVENOUS
  Filled 2018-08-13: qty 50

## 2018-08-13 MED ORDER — ONDANSETRON HCL 4 MG/2ML IJ SOLN
4.0000 mg | Freq: Four times a day (QID) | INTRAMUSCULAR | Status: DC | PRN
  Administered 2018-08-13 – 2018-08-14 (×2): 4 mg via INTRAVENOUS
  Filled 2018-08-13 (×2): qty 2

## 2018-08-13 MED ORDER — THIAMINE HCL 100 MG/ML IJ SOLN
100.0000 mg | Freq: Every day | INTRAMUSCULAR | Status: DC
  Administered 2018-08-13 – 2018-08-15 (×3): 100 mg via INTRAVENOUS
  Filled 2018-08-13 (×3): qty 2

## 2018-08-13 MED ORDER — ATENOLOL 25 MG PO TABS: 25.0000 mg | ORAL_TABLET | Freq: Every morning | ORAL | Status: DC

## 2018-08-13 MED ORDER — METOPROLOL TARTRATE 5 MG/5ML IV SOLN: 2.5000 mg | Freq: Four times a day (QID) | INTRAVENOUS | Status: DC

## 2018-08-13 MED ORDER — ALBUMIN HUMAN 5 % IV SOLN
25.0000 g | Freq: Once | INTRAVENOUS | Status: AC
  Administered 2018-08-13: 25 g via INTRAVENOUS
  Filled 2018-08-13: qty 500

## 2018-08-13 MED ORDER — IOHEXOL 300 MG/ML  SOLN
100.0000 mL | Freq: Once | INTRAMUSCULAR | Status: AC | PRN
  Administered 2018-08-13: 100 mL via INTRAVENOUS

## 2018-08-13 MED ORDER — FUROSEMIDE 10 MG/ML IJ SOLN
20.0000 mg | Freq: Once | INTRAMUSCULAR | Status: AC
  Administered 2018-08-13: 20 mg via INTRAVENOUS
  Filled 2018-08-13: qty 2

## 2018-08-13 MED ORDER — THIAMINE HCL 100 MG/ML IJ SOLN
Freq: Once | INTRAVENOUS | Status: AC
  Administered 2018-08-13: 04:00:00 via INTRAVENOUS
  Filled 2018-08-13: qty 1000

## 2018-08-13 MED ORDER — POTASSIUM CHLORIDE 10 MEQ/100ML IV SOLN
10.0000 meq | INTRAVENOUS | Status: AC
  Administered 2018-08-13 (×3): 10 meq via INTRAVENOUS
  Filled 2018-08-13 (×3): qty 100

## 2018-08-13 MED ORDER — FENTANYL CITRATE (PF) 100 MCG/2ML IJ SOLN
25.0000 ug | INTRAMUSCULAR | Status: DC | PRN
  Filled 2018-08-13: qty 2

## 2018-08-13 MED ORDER — SODIUM CHLORIDE 0.9% FLUSH
3.0000 mL | Freq: Two times a day (BID) | INTRAVENOUS | Status: DC
  Administered 2018-08-13 – 2018-08-26 (×24): 3 mL via INTRAVENOUS

## 2018-08-13 MED ORDER — POTASSIUM CHLORIDE CRYS ER 20 MEQ PO TBCR
20.0000 meq | EXTENDED_RELEASE_TABLET | Freq: Three times a day (TID) | ORAL | Status: AC
  Administered 2018-08-13 (×3): 20 meq via ORAL
  Filled 2018-08-13 (×3): qty 1

## 2018-08-13 MED ORDER — POTASSIUM CHLORIDE IN NACL 20-0.9 MEQ/L-% IV SOLN
INTRAVENOUS | Status: DC
  Administered 2018-08-13 – 2018-08-16 (×5): via INTRAVENOUS
  Filled 2018-08-13 (×6): qty 1000

## 2018-08-13 MED ORDER — ONDANSETRON HCL 4 MG PO TABS: 4.0000 mg | ORAL_TABLET | Freq: Four times a day (QID) | ORAL | Status: DC | PRN

## 2018-08-13 MED ORDER — MAGNESIUM SULFATE 2 GM/50ML IV SOLN
2.0000 g | Freq: Once | INTRAVENOUS | Status: AC
  Administered 2018-08-13: 2 g via INTRAVENOUS
  Filled 2018-08-13: qty 50

## 2018-08-13 MED ORDER — LORAZEPAM 2 MG/ML IJ SOLN
2.0000 mg | INTRAMUSCULAR | Status: DC | PRN
  Administered 2018-08-13 – 2018-08-14 (×4): 2 mg via INTRAVENOUS
  Administered 2018-08-15 (×2): 3 mg via INTRAVENOUS
  Administered 2018-08-17 – 2018-08-20 (×3): 2 mg via INTRAVENOUS
  Filled 2018-08-13 (×6): qty 1
  Filled 2018-08-13: qty 2
  Filled 2018-08-13 (×2): qty 1
  Filled 2018-08-13 (×2): qty 2

## 2018-08-13 MED ORDER — FENTANYL CITRATE (PF) 100 MCG/2ML IJ SOLN
25.0000 ug | INTRAMUSCULAR | Status: DC | PRN
  Administered 2018-08-14: 50 ug via INTRAVENOUS
  Filled 2018-08-13: qty 2

## 2018-08-13 MED ORDER — ATENOLOL 25 MG PO TABS: 12.5000 mg | ORAL_TABLET | Freq: Every morning | ORAL | Status: DC

## 2018-08-13 MED ORDER — ACETAMINOPHEN 650 MG RE SUPP: 650.0000 mg | Freq: Four times a day (QID) | RECTAL | Status: DC | PRN

## 2018-08-13 MED ORDER — METOPROLOL TARTRATE 5 MG/5ML IV SOLN
2.5000 mg | Freq: Three times a day (TID) | INTRAVENOUS | Status: DC
  Administered 2018-08-13 – 2018-08-15 (×5): 2.5 mg via INTRAVENOUS
  Filled 2018-08-13 (×5): qty 5

## 2018-08-13 MED ORDER — ALBUTEROL SULFATE (2.5 MG/3ML) 0.083% IN NEBU: 2.5000 mg | INHALATION_SOLUTION | Freq: Four times a day (QID) | RESPIRATORY_TRACT | Status: DC | PRN

## 2018-08-13 MED ORDER — IPRATROPIUM-ALBUTEROL 0.5-2.5 (3) MG/3ML IN SOLN: 3.0000 mL | RESPIRATORY_TRACT | Status: DC | PRN

## 2018-08-13 MED ORDER — SODIUM CHLORIDE 0.9 % IV BOLUS
500.0000 mL | Freq: Once | INTRAVENOUS | Status: AC
  Administered 2018-08-13: 500 mL via INTRAVENOUS

## 2018-08-13 MED ORDER — SODIUM CHLORIDE 0.9 % IV SOLN
3.0000 g | Freq: Three times a day (TID) | INTRAVENOUS | Status: DC
  Administered 2018-08-13 – 2018-08-15 (×4): 3 g via INTRAVENOUS
  Filled 2018-08-13 (×6): qty 3

## 2018-08-13 MED ORDER — ALBUTEROL SULFATE (2.5 MG/3ML) 0.083% IN NEBU
2.5000 mg | INHALATION_SOLUTION | RESPIRATORY_TRACT | Status: DC | PRN
  Administered 2018-08-14 – 2018-08-17 (×6): 2.5 mg via RESPIRATORY_TRACT
  Filled 2018-08-13 (×7): qty 3

## 2018-08-13 MED ORDER — POTASSIUM CHLORIDE CRYS ER 20 MEQ PO TBCR: 20.0000 meq | EXTENDED_RELEASE_TABLET | Freq: Once | ORAL | Status: DC

## 2018-08-13 MED ORDER — ACETAMINOPHEN 325 MG PO TABS: 650.0000 mg | ORAL_TABLET | Freq: Four times a day (QID) | ORAL | Status: DC | PRN

## 2018-08-13 MED ORDER — METOPROLOL TARTRATE 5 MG/5ML IV SOLN
5.0000 mg | Freq: Four times a day (QID) | INTRAVENOUS | Status: DC
  Administered 2018-08-13: 5 mg via INTRAVENOUS
  Filled 2018-08-13: qty 5

## 2018-08-13 NOTE — Consult Note (Signed)
Reason for Consult: Left shoulder fracture Referring Physician: EDP  [name] is an 68 y.o. female.  HPI: Patient with left shoulder pain after fall several days ago possibly due to drinking alcohol. Patient complains of pain with attempts to move the arm. She reports possibly hitting her head. No other complaints other than the shoulder at this time.   Past Medical History:  Diagnosis Date  . Anemia   . Cancer [facility]) 2007   lung cancer, right, chemo done  . Complication of anesthesia 2007   slow to awaken  . History of blood transfusion 2010  . Hypertension   . Occasional tremors     Past Surgical History:  Procedure Laterality Date  . COLONOSCOPY WITH PROPOFOL N/A 11/28/2012   Procedure: COLONOSCOPY WITH PROPOFOL;  Surgeon: Garlan Fair, MD;  Location: [facility] END[facility]COPY;  Service: Endoscopy;  Laterality: N/A;  . right lung lobectomy  2007    Family History  Problem Relation Age of Onset  . Cancer Mother        bladder  . Heart disease Father   . Diabetes Father     Social History:  reports that she has been smoking cigarettes. She has a 75.00 pack-year smoking history. She has never used smokeless tobacco. She reports current alcohol use of about 7.0 standard drinks of alcohol per week. She reports that she does not use drugs.  Allergies:  Allergies  Allergen Reactions  . Tape Other (See Comments)    Patient PREFERS paper tape    Medications: I have reviewed the patient's current medications.  Results for orders placed or performed during the hospital encounter of 08/12/18 (from the past 48 hour(s))  Comprehensive metabolic panel     Status: Abnormal   Collection Time: 08/12/18  6:57 PM  Result Value Ref Range   Sodium 140 135 - 145 mmol/L   Potassium 3.2 (L) 3.5 - 5.1 mmol/L   Chloride 100 98 - 111 mmol/L   CO2 24 22 - 32 mmol/L   Glucose, Bld 128 (H) 70 - 99 mg/dL   BUN 15 8 - 23 mg/dL   Creatinine, Ser 0.51 0.44 - 1.00 mg/dL   Calcium 8.7 (L) 8.9 - 10.3  mg/dL   Total Protein 6.0 (L) 6.5 - 8.1 g/dL   Albumin 3.1 (L) 3.5 - 5.0 g/dL   AST 110 (H) 15 - 41 U/L   ALT 76 (H) 0 - 44 U/L   Alkaline Phosphatase 105 38 - 126 U/L   Total Bilirubin 1.4 (H) 0.3 - 1.2 mg/dL   GFR calc non Af Amer >60 >60 mL/min   GFR calc Af Amer >60 >60 mL/min   Anion gap 16 (H) 5 - 15    Comment: Performed at [facility], [address] [place], [place] [address]  Protime-INR     Status: None   Collection Time: 08/12/18  6:57 PM  Result Value Ref Range   Prothrombin Time 12.6 11.4 - 15.2 seconds   INR 0.95     Comment: Performed at [facility] [address] [place], [place] [address]  CBC with Differential     Status: Abnormal   Collection Time: 08/12/18  6:57 PM  Result Value Ref Range   WBC 12.8 (H) 4.0 - 10.5 K/uL   RBC 3.08 (L) 3.87 - 5.11 MIL/uL   Hemoglobin 11.0 (L) 12.0 - 15.0 g/dL   HCT 34.0 (L) 36.0 - 46.0 %   MCV 110.4 (H) 80.0 -  100.0 fL   MCH 35.7 (H) 26.0 - 34.0 pg   MCHC 32.4 30.0 - 36.0 g/dL   RDW 12.9 11.5 - 15.5 %   Platelets 238 150 - 400 K/uL   nRBC 0.0 0.0 - 0.2 %   Neutrophils Relative % 84 %   Neutro Abs 10.9 (H) 1.7 - 7.7 K/uL   Lymphocytes Relative 5 %   Lymphs Abs 0.6 (L) 0.7 - 4.0 K/uL   Monocytes Relative 9 %   Monocytes Absolute 1.1 (H) 0.1 - 1.0 K/uL   Eosinophils Relative 1 %   Eosinophils Absolute 0.1 0.0 - 0.5 K/uL   Basophils Relative 0 %   Basophils Absolute 0.0 0.0 - 0.1 K/uL   Immature Granulocytes 1 %   Abs Immature Granulocytes 0.08 (H) 0.00 - 0.07 K/uL    Comment: Performed at [facility] [address] [place], [place] [address]  Urinalysis, Routine w reflex microscopic     Status: Abnormal   Collection Time: 08/12/18  6:57 PM  Result Value Ref Range   Color, Urine YELLOW YELLOW   APPearance CLEAR CLEAR   Specific Gravity, Urine 1.019 1.005 - 1.030   pH 5.0 5.0 - 8.0   Glucose, UA NEGATIVE NEGATIVE mg/dL   Hgb urine dipstick NEGATIVE NEGATIVE   Bilirubin Urine NEGATIVE NEGATIVE    Ketones, ur 80 (A) NEGATIVE mg/dL   Protein, ur 30 (A) NEGATIVE mg/dL   Nitrite NEGATIVE NEGATIVE   Leukocytes, UA NEGATIVE NEGATIVE   RBC / HPF 0-5 0 - 5 RBC/hpf   WBC, UA 0-5 0 - 5 WBC/hpf   Bacteria, UA NONE SEEN NONE SEEN   Mucus PRESENT    Non Squamous Epithelial 0-5 (A) NONE SEEN    Comment: Performed at [facility], [place] [address] [place], [place] [address]  CK     Status: None   Collection Time: 08/12/18  6:57 PM  Result Value Ref Range   Total CK 232 38 - 234 U/L    Comment: Performed at [facility], [place] [address], [place], [place] [address]  Ethanol     Status: None   Collection Time: 08/12/18  6:57 PM  Result Value Ref Range   Alcohol, Ethyl (B) <10 <10 mg/dL    Comment: (NOTE) Lowest detectable limit for serum alcohol is 10 mg/dL. For medical purposes only. Performed at [facility], [place] [address] [place], [place] [address]   Lipase, blood     Status: None   Collection Time: 08/12/18  6:57 PM  Result Value Ref Range   Lipase 20 11 - 51 U/L    Comment: Performed at [facility], [place] [address], [place], [place] [address]  I-stat troponin, ED     Status: None   Collection Time: 08/12/18  7:03 PM  Result Value Ref Range   Troponin i, poc 0.00 0.00 - 0.08 ng/mL   Comment 3            Comment: Due to the release kinetics of cTnI, a negative result within the first hours of the onset of symptoms does not rule out myocardial infarction with certainty. If myocardial infarction is still suspected, repeat the test at appropriate intervals.   Lactic acid, plasma     Status: None   Collection Time: 08/12/18 11:15 PM  Result Value Ref Range   Lactic Acid, Venous 0.9 0.5 - 1.9 mmol/L    Comment: Performed at [facility],  [address], [place], [place] [address]  Type and screen [place]     Status: None   Collection Time: 08/13/18  2:18 AM  Result Value Ref Range   ABO/RH(D) O NEG    Antibody Screen NEG     Sample Expiration      08/16/2018 Performed at [facility], [place] [address], [place], [place] [address]   Comprehensive metabolic panel     Status: Abnormal   Collection Time: 08/13/18  2:21 AM  Result Value Ref Range   Sodium 140 135 - 145 mmol/L   Potassium 2.9 (L) 3.5 - 5.1 mmol/L   Chloride 113 (H) 98 - 111 mmol/L   CO2 21 (L) 22 - 32 mmol/L   Glucose, Bld 88 70 - 99 mg/dL   BUN 11 8 - 23 mg/dL   Creatinine, Ser 0.35 (L) 0.44 - 1.00 mg/dL   Calcium 6.4 (LL) 8.9 - 10.3 mg/dL    Comment: CRITICAL RESULT CALLED TO, READ BACK BY AND VERIFIED WITH: S.LOWDERMILK,RN 7902 08/13/2018 M.CAMPBELL    Total Protein 3.7 (L) 6.5 - 8.1 g/dL   Albumin 1.9 (L) 3.5 - 5.0 g/dL   AST 62 (H) 15 - 41 U/L   ALT 45 (H) 0 - 44 U/L   Alkaline Phosphatase 63 38 - 126 U/L   Total Bilirubin 0.9 0.3 - 1.2 mg/dL   GFR calc non Af Amer >60 >60 mL/min   GFR calc Af Amer >60 >60 mL/min   Anion gap 6 5 - 15    Comment: Performed at [facility] [address], [place], [place] [address]  CBC WITH DIFFERENTIAL     Status: Abnormal   Collection Time: 08/13/18  2:21 AM  Result Value Ref Range   WBC 7.4 4.0 - 10.5 K/uL   RBC 2.11 (L) 3.87 - 5.11 MIL/uL   Hemoglobin 7.5 (L) 12.0 - 15.0 g/dL    Comment: REPEATED TO VERIFY DELTA CHECK NOTED    HCT 24.1 (L) 36.0 - 46.0 %   MCV 114.2 (H) 80.0 - 100.0 fL   MCH 35.5 (H) 26.0 - 34.0 pg   MCHC 31.1 30.0 - 36.0 g/dL   RDW 13.1 11.5 - 15.5 %   Platelets 167 150 - 400 K/uL   nRBC 0.0 0.0 - 0.2 %   Neutrophils Relative % 79 %   Neutro Abs 5.8 1.7 - 7.7 K/uL   Lymphocytes Relative 9 %   Lymphs Abs 0.6 (L) 0.7 - 4.0 K/uL   Monocytes Relative 11 %   Monocytes Absolute 0.8 0.1 - 1.0 K/uL   Eosinophils Relative 0 %   Eosinophils Absolute 0.0 0.0 - 0.5 K/uL   Basophils Relative 0 %   Basophils Absolute 0.0 0.0 - 0.1 K/uL   Immature Granulocytes 1 %   Abs Immature Granulocytes 0.04 0.00 - 0.07 K/uL   Polychromasia PRESENT     Comment: Performed at  [facility], [place] [address], [place], [place] [address]  Magnesium     Status: Abnormal   Collection Time: 08/13/18  2:21 AM  Result Value Ref Range   Magnesium 1.0 (L) 1.7 - 2.4 mg/dL    Comment: Performed at [facility] [address], [place], [place] [address]  Cortisol     Status: None   Collection Time: 08/13/18  2:21 AM  Result Value Ref Range   Cortisol, Plasma 12.2 ug/dL    Comment: (NOTE) AM  6.7 - 22.6 ug/dL PM   <10.0       ug/dL Performed at [facility] [address] [place], [place] [address]     Ct Head Wo Contrast  Result Date: 08/12/2018 CLINICAL DATA:  Fall on Thursday EXAM: CT HEAD WITHOUT CONTRAST CT CERVICAL SPINE WITHOUT CONTRAST TECHNIQUE: Multidetector CT imaging of the head and cervical spine was performed following the standard protocol without intravenous contrast. Multiplanar CT image reconstructions of the cervical spine were also generated. COMPARISON:  CT brain 06/06/2018 FINDINGS: CT HEAD FINDINGS Brain: No acute territorial infarction, hemorrhage, or intracranial mass. Mild to moderate atrophy. Mild small vessel ischemic changes of the white matter. Stable ventricle size. Vascular: No hyperdense vessels. Scattered calcifications at the carotid siphon. Skull: No fracture Sinuses/Orbits: No acute finding. Other: None CT CERVICAL SPINE FINDINGS Alignment: No subluxation.  Facet alignment is normal Skull base and vertebrae: No acute fracture. No primary bone lesion or focal pathologic process. Soft tissues and spinal canal: No prevertebral fluid or swelling. No visible canal hematoma. Disc levels:  Mild degenerative changes C4-C5. Upper chest: Negative. Other: Apical emphysema. IMPRESSION: 1. No CT evidence for acute intracranial abnormality. Atrophy and mild small vessel ischemic changes of the white matter. 2. Mild degenerative changes.  No acute osseous abnormality. Electronically Signed   By: Donavan Foil M.D.   On: 08/12/2018 20:44    Ct Chest W Contrast  Result Date: 08/13/2018 CLINICAL DATA:  Fall. EXAM: CT CHEST, ABDOMEN, AND PELVIS WITH CONTRAST TECHNIQUE: Multidetector CT imaging of the chest, abdomen and pelvis was performed following the standard protocol during bolus administration of intravenous contrast. CONTRAST:  137mL OMNIPAQUE IOHEXOL 300 MG/ML  SOLN COMPARISON:  PET-CT 06/21/2018 FINDINGS: CT CHEST FINDINGS Cardiovascular: Normal heart size. No pericardial effusions. Normal caliber thoracic aorta. No dissection. Great vessel origins are patent. Central pulmonary arteries are well opacified. Mediastinum/Nodes: Esophagus is decompressed. No significant lymphadenopathy in the chest. Surgical clips in the right hilum and right para esophageal region. Lungs/Pleura: Emphysematous changes in the lungs. Postoperative partial right lung resection with scarring in the posteromedial right lung, possibly postoperative or possibly post radiation. No consolidation or edema. No pleural effusions. No pneumothorax. There is a somewhat lobulated nodule in the left lower lung posteriorly, measuring 12 mm diameter. Size is similar to previous PET-CT scan. The PET CT suggested low metabolic activity suspicious for a low-grade tumor. No new lesions. Musculoskeletal: Acute appearing comminuted fractures of the left humeral head and neck with displacement of the fracture fragments. No glenohumeral dislocation. Prominent surrounding soft tissue edema and/or contusion. Old fracture deformity of the right shoulder. No acute displaced rib fractures are demonstrated. Degenerative changes in the spine. Thoracic scoliosis convex towards the right. CT ABDOMEN PELVIS FINDINGS Hepatobiliary: Diffuse fatty infiltration of the liver. No focal lesions. Gallbladder and bile ducts are unremarkable. Pancreas: Unremarkable. No pancreatic ductal dilatation or surrounding inflammatory changes. Spleen: Normal in size without focal abnormality. Adrenals/Urinary Tract: No  adrenal gland nodules. Several stones in the lower poles of both kidneys. Largest is on the left and measures about 3 mm diameter. No hydronephrosis or hydroureter. Nephrograms are symmetrical. Bladder is unremarkable. Stomach/Bowel: Stomach, small bowel, and colon are not abnormally distended. Diverticulosis throughout the colon without evidence of diverticulitis. Scattered stool throughout the colon. Appendix is normal. Vascular/Lymphatic: Aortic atherosclerosis. No enlarged abdominal or pelvic lymph nodes. Reproductive: Uterus and bilateral adnexa are unremarkable. Other: No abdominal wall hernia or abnormality. No abdominopelvic ascites. Musculoskeletal: Lumbar scoliosis convex towards the  left. Degenerative changes in the spine. No destructive bone lesions. IMPRESSION: 1. Acute appearing comminuted fractures of the left humeral head and neck with surrounding soft tissue edema and/or contusion. 2. 12 mm nodule in the left lower lung is similar to previous PET-CT scan. See PET-CT report, suggesting possible low-grade malignancy. 3. Postoperative changes in the right lung. No evidence of active pulmonary disease. 4. Diffuse fatty infiltration of the liver. 5. Nonobstructing stones in both kidneys. 6. Colonic diverticulosis without evidence of diverticulitis. Aortic Atherosclerosis (ICD10-I70.0) and Emphysema (ICD10-J43.9). Electronically Signed   By: Lucienne Capers M.D.   On: 08/13/2018 02:39   Ct Cervical Spine Wo Contrast  Result Date: 08/12/2018 CLINICAL DATA:  Fall on Thursday EXAM: CT HEAD WITHOUT CONTRAST CT CERVICAL SPINE WITHOUT CONTRAST TECHNIQUE: Multidetector CT imaging of the head and cervical spine was performed following the standard protocol without intravenous contrast. Multiplanar CT image reconstructions of the cervical spine were also generated. COMPARISON:  CT brain 06/06/2018 FINDINGS: CT HEAD FINDINGS Brain: No acute territorial infarction, hemorrhage, or intracranial mass. Mild to  moderate atrophy. Mild small vessel ischemic changes of the white matter. Stable ventricle size. Vascular: No hyperdense vessels. Scattered calcifications at the carotid siphon. Skull: No fracture Sinuses/Orbits: No acute finding. Other: None CT CERVICAL SPINE FINDINGS Alignment: No subluxation.  Facet alignment is normal Skull base and vertebrae: No acute fracture. No primary bone lesion or focal pathologic process. Soft tissues and spinal canal: No prevertebral fluid or swelling. No visible canal hematoma. Disc levels:  Mild degenerative changes C4-C5. Upper chest: Negative. Other: Apical emphysema. IMPRESSION: 1. No CT evidence for acute intracranial abnormality. Atrophy and mild small vessel ischemic changes of the white matter. 2. Mild degenerative changes.  No acute osseous abnormality. Electronically Signed   By: Donavan Foil M.D.   On: 08/12/2018 20:44   Ct Abdomen Pelvis W Contrast  Result Date: 08/13/2018 CLINICAL DATA:  Fall. EXAM: CT CHEST, ABDOMEN, AND PELVIS WITH CONTRAST TECHNIQUE: Multidetector CT imaging of the chest, abdomen and pelvis was performed following the standard protocol during bolus administration of intravenous contrast. CONTRAST:  128mL OMNIPAQUE IOHEXOL 300 MG/ML  SOLN COMPARISON:  PET-CT 06/21/2018 FINDINGS: CT CHEST FINDINGS Cardiovascular: Normal heart size. No pericardial effusions. Normal caliber thoracic aorta. No dissection. Great vessel origins are patent. Central pulmonary arteries are well opacified. Mediastinum/Nodes: Esophagus is decompressed. No significant lymphadenopathy in the chest. Surgical clips in the right hilum and right para esophageal region. Lungs/Pleura: Emphysematous changes in the lungs. Postoperative partial right lung resection with scarring in the posteromedial right lung, possibly postoperative or possibly post radiation. No consolidation or edema. No pleural effusions. No pneumothorax. There is a somewhat lobulated nodule in the left lower lung  posteriorly, measuring 12 mm diameter. Size is similar to previous PET-CT scan. The PET CT suggested low metabolic activity suspicious for a low-grade tumor. No new lesions. Musculoskeletal: Acute appearing comminuted fractures of the left humeral head and neck with displacement of the fracture fragments. No glenohumeral dislocation. Prominent surrounding soft tissue edema and/or contusion. Old fracture deformity of the right shoulder. No acute displaced rib fractures are demonstrated. Degenerative changes in the spine. Thoracic scoliosis convex towards the right. CT ABDOMEN PELVIS FINDINGS Hepatobiliary: Diffuse fatty infiltration of the liver. No focal lesions. Gallbladder and bile ducts are unremarkable. Pancreas: Unremarkable. No pancreatic ductal dilatation or surrounding inflammatory changes. Spleen: Normal in size without focal abnormality. Adrenals/Urinary Tract: No adrenal gland nodules. Several stones in the lower poles of both kidneys. Largest is on  the left and measures about 3 mm diameter. No hydronephrosis or hydroureter. Nephrograms are symmetrical. Bladder is unremarkable. Stomach/Bowel: Stomach, small bowel, and colon are not abnormally distended. Diverticulosis throughout the colon without evidence of diverticulitis. Scattered stool throughout the colon. Appendix is normal. Vascular/Lymphatic: Aortic atherosclerosis. No enlarged abdominal or pelvic lymph nodes. Reproductive: Uterus and bilateral adnexa are unremarkable. Other: No abdominal wall hernia or abnormality. No abdominopelvic ascites. Musculoskeletal: Lumbar scoliosis convex towards the left. Degenerative changes in the spine. No destructive bone lesions. IMPRESSION: 1. Acute appearing comminuted fractures of the left humeral head and neck with surrounding soft tissue edema and/or contusion. 2. 12 mm nodule in the left lower lung is similar to previous PET-CT scan. See PET-CT report, suggesting possible low-grade malignancy. 3.  Postoperative changes in the right lung. No evidence of active pulmonary disease. 4. Diffuse fatty infiltration of the liver. 5. Nonobstructing stones in both kidneys. 6. Colonic diverticulosis without evidence of diverticulitis. Aortic Atherosclerosis (ICD10-I70.0) and Emphysema (ICD10-J43.9). Electronically Signed   By: Lucienne Capers M.D.   On: 08/13/2018 02:39   Dg Chest Port 1 View  Result Date: 08/12/2018 CLINICAL DATA:  Acute RIGHT chest pain following fall. EXAM: PORTABLE CHEST 1 VIEW COMPARISON:  06/21/2018 PET CT, 10/13/2017 chest radiograph and other studies FINDINGS: The cardiomediastinal silhouette is unchanged. RIGHT thoracic surgical changes and scarring again noted. There is no evidence of focal airspace disease, pulmonary edema, suspicious pulmonary nodule/mass, pleural effusion, or pneumothorax. No acute bony abnormalities are identified. Thoracic scoliosis again identified. IMPRESSION: No evidence of acute cardiopulmonary disease. Electronically Signed   By: Margarette Canada M.D.   On: 08/12/2018 19:48   Dg Shoulder Left  Result Date: 08/12/2018 CLINICAL DATA:  Acute LEFT shoulder pain following fall. Initial encounter. EXAM: LEFT SHOULDER - 2+ VIEW COMPARISON:  None. FINDINGS: A humeral neck fracture is identified with equivocal extension into the head. Apex MEDIAL/anterior angulation is noted. The humeral head appears located. IMPRESSION: Angulated humeral neck fracture which may extend into the head. Electronically Signed   By: Margarette Canada M.D.   On: 08/12/2018 19:50    ROS Blood pressure (!) 95/50, pulse (!) 107, temperature 98.7 F (37.1 C), temperature source Oral, resp. rate (!) 21, height 5' 4.5" (1.638 m), weight 52.6 kg, SpO2 95 %. Physical Exam: Patient in moderate distress, laying on ER stretcher with the left shoulder in a sling. No pain with AROM of the right arm. Left shoulder girdle with diffuse bruising that looks sub-acute. No pain with left elbow or left wrist ROM.  Skin otherwise intact, and distally grossly NVI Bilateral LEs with pain free AROM. NVI  Assessment/Plan: Left displaced proximal humerus fracture with 100 displacement and head rolled posteriorly.  Some scalloping of the head indicating subacute nature of the injury. The patient will require surgical management for this injury. Based upon the CT scan, I suspect that she will require reverse TSA.  Patient is anemic with some electrolyte concerns at this time. She will need to be medically optimized today in anticipation of possible surgery Monday if cleared.  Will defer to medical team on transfusion but would recommend getting her hemoglobin over 10 pre-op Will follow  Rian Koon,STEVEN R 08/13/2018, 3:40 AM

## 2018-08-13 NOTE — Progress Notes (Signed)
Patient admitted to [facility]. Patient is tachycardic, tachypneic and blood pressure remains soft, despite over 5052ml of fluids given in ED. Per pt report patient only had 2105ml of urine output. Patient arrived with IV fluids and vitamins infusing. Patient was started on 1U PRBCs as ordered per MD. Patient presented with wheezing and strong congested cough and periorbital edema. Paged coverage for TRH and lasix 20mg  X1 ordered as patient could be at risk for fluid volume overload. Will continue to monitor and treat per MD orders.

## 2018-08-13 NOTE — H&P (Addendum)
History and Physical    [name] GMW:[id number] DOB: 1951-06-22 DOA: 08/12/2018  PCP: Wenda Low, MD   Patient coming from: Home   Chief Complaint: Found down   HPI: [name] is a 68 y.o. female with medical history significant for hypertension, anxiety, cancer of the right lung status post chemotherapy, now presenting to the emergency department after she was found down on the floor, reporting that she had fallen on 08/10/2018 and was unable to get up.  Patient reports that she has been drinking daily since her husband died, is unable to really quantify how much but states that it is limited to wine.  She reports falling in her home on 08/10/2018, but unsure why she fell.  She does not believe that she hit her head, denies any headache, chest pain, shortness of breath, change in vision or hearing, or focal numbness or weakness.  She complains of severe pain at her left shoulder.  Someone called EMS for the patient this evening, she was found on the floor soiled in feces and urine and with multiple wine bottles nearby.  She was having severe left shoulder pain and was treated with 100 mcg of fentanyl prior to arrival in the ED.  ED Course: Upon arrival to the ED, patient is found to be afebrile, saturating well on room air, tachycardic in the 440H, and with systolic blood pressure in the low 90s.  EKG features a sinus tachycardia with rate 110 and PVC.  Noncontrast head CT is negative for acute intracranial abnormality and cervical spine CT is negative for acute pathology.  Chest x-ray is negative for acute cardiopulmonary disease and radiographs of the left shoulder reveal angulated humeral neck fracture with possible extension into the humeral head.  Chemistry panel is notable for potassium 3.2, mild elevation in transaminases and total bilirubin, and anion gap of 16.  Lactic acid was normal.  CBC is notable for a leukocytosis to 12,800 and a macrocytic anemia with hemoglobin 11.0 and MCV  110.  INR is normal and troponin is undetectable.  Urinalysis features ketonuria and proteinuria.  Blood cultures were collected, 3 L of normal saline administered, and the patient was treated with empiric vancomycin, cefepime, and Flagyl.  Blood pressure did not improve with 3 L of normal saline, she was started on a 4th liter and ED physician ordered CT of chest/abdomen/pelvis for further evaluation; this was negative for acute finding other than the humerus fracture. BP has come up with MAP sustaining >65 with additional IVF. There is no appreciable congestion and no edema on CT chest, urine is still dark, and she will be admitted to SDU with ongoing fluid-resusitation.   Review of Systems:  All other systems reviewed and apart from HPI, are negative.  Past Medical History:  Diagnosis Date  . Anemia   . Cancer [facility]) 2007   lung cancer, right, chemo done  . Complication of anesthesia 2007   slow to awaken  . History of blood transfusion 2010  . Hypertension   . Occasional tremors     Past Surgical History:  Procedure Laterality Date  . COLONOSCOPY WITH PROPOFOL N/A 11/28/2012   Procedure: COLONOSCOPY WITH PROPOFOL;  Surgeon: Garlan Fair, MD;  Location: [facility] END[facility]COPY;  Service: Endoscopy;  Laterality: N/A;  . right lung lobectomy  2007     reports that she has been smoking cigarettes. She has a 75.00 pack-year smoking history. She has never used smokeless tobacco. She reports current alcohol use of  about 7.0 standard drinks of alcohol per week. She reports that she does not use drugs.  Allergies  Allergen Reactions  . Tape Other (See Comments)    Patient PREFERS paper tape    Family History  Problem Relation Age of Onset  . Cancer Mother        bladder  . Heart disease Father   . Diabetes Father      Prior to Admission medications   Medication Sig Start Date End Date Taking? Authorizing Provider  busPIRone (BUSPAR) 10 MG tablet Take 10 mg by mouth 3 (three) times daily.     Yes [provider]  hydrocortisone-pramoxine (ANALPRAM-HC) 2.5-1 % rectal cream PLACE RECTALLY 4 (FOUR) TIMES DAILY. FOR IRRITATED AND PAINFUL HEMORRHOIDS Patient taking differently: Place 1 application rectally 4 (four) times daily as needed (for irritated and painful hemorrhoids).    Liston Alba, MD  atenolol (TENORMIN) 25 MG tablet Take 25 mg by mouth every morning.    [provider]  calcium carbonate 200 MG capsule Take 250 mg by mouth daily.    [provider]  cholecalciferol (VITAMIN D) 1000 UNITS tablet Take 1,000 Units by mouth daily.    [provider]  Multiple Vitamin (MULTIVITAMIN) tablet Take 1 tablet by mouth daily.    [provider]  primidone (MYSOLINE) 50 MG tablet Take 25 mg by mouth every morning.     [provider]  PROAIR HFA 108 [phone] Base) MCG/ACT inhaler Inhale 2 puffs into the lungs every 4 (four) hours as needed. 04/20/18   [provider]  psyllium (REGULOID) 0.52 G capsule Take 0.52 g by mouth daily. Fiber pill qd    [provider]    Physical Exam: Vitals:   08/13/18 0000 08/13/18 0015 08/13/18 0030 08/13/18 0031  BP: (!) 100/56 (!) 96/50    Pulse: (!) 111 (!) 109    Resp: 20 18    Temp:   98.7 F (37.1 C)   TempSrc:   Oral   SpO2: 93% 91%    Weight:    52.6 kg  Height:    5' 4.5" (1.638 m)    Constitutional: Not in acute respiratory distress. Anxious, tremulous.  Eyes: PERTLA, lids and conjunctivae normal ENMT: Mucous membranes are moist. Posterior pharynx clear of any exudate or lesions.   Neck: normal, supple, no masses, no thyromegaly Respiratory: clear to auscultation bilaterally, no wheezing, no crackles. Normal respiratory effort. No accessory muscle use.  Cardiovascular: S1 & S2 heard, regular rate and rhythm. No extremity edema.   Abdomen: No distension, no tenderness, soft. Bowel sounds active.  Musculoskeletal: no clubbing / cyanosis. Large hematoma about the  left shoulder which is exquisitely tender.   Skin: Superficial excoriations and abrasions with crust about the extremities, left shoulder hematoma as above. Poor turgor.  Neurologic: CN 2-12 grossly intact. Sensation to light touch intact. Moving all extremities. Resting tremor.  Psychiatric: Alert and oriented to person, place, and situation. Anxious.      Labs on Admission: I have personally reviewed following labs and imaging studies  CBC: Recent Labs  Lab 08/12/18 1857  WBC 12.8*  NEUTROABS 10.9*  HGB 11.0*  HCT 34.0*  MCV 110.4*  PLT 350   Basic Metabolic Panel: Recent Labs  Lab 08/12/18 1857  NA 140  K 3.2*  CL 100  CO2 24  GLUCOSE 128*  BUN 15  CREATININE 0.51  CALCIUM 8.7*   GFR: Estimated Creatinine Clearance: 55.1 mL/min (by C-G formula  based on SCr of 0.51 mg/dL). Liver Function Tests: Recent Labs  Lab 08/12/18 1857  AST 110*  ALT 76*  ALKPHOS 105  BILITOT 1.4*  PROT 6.0*  ALBUMIN 3.1*   Recent Labs  Lab 08/12/18 1857  LIPASE 20   No results for input(s): AMMONIA in the last 168 hours. Coagulation Profile: Recent Labs  Lab 08/12/18 1857  INR 0.95   Cardiac Enzymes: Recent Labs  Lab 08/12/18 1857  CKTOTAL 232   BNP (last 3 results) No results for input(s): PROBNP in the last 8760 hours. HbA1C: No results for input(s): HGBA1C in the last 72 hours. CBG: No results for input(s): GLUCAP in the last 168 hours. Lipid Profile: No results for input(s): CHOL, HDL, LDLCALC, TRIG, CHOLHDL, LDLDIRECT in the last 72 hours. Thyroid Function Tests: No results for input(s): TSH, T4TOTAL, FREET4, T3FREE, THYROIDAB in the last 72 hours. Anemia Panel: No results for input(s): VITAMINB12, FOLATE, FERRITIN, TIBC, IRON, RETICCTPCT in the last 72 hours. Urine analysis:    Component Value Date/Time   COLORURINE YELLOW 08/12/2018 1857   APPEARANCEUR CLEAR 08/12/2018 1857   LABSPEC 1.019 08/12/2018 1857   PHURINE 5.0 08/12/2018 1857   GLUCOSEU  NEGATIVE 08/12/2018 1857   HGBUR NEGATIVE 08/12/2018 1857   BILIRUBINUR NEGATIVE 08/12/2018 1857   KETONESUR 80 (A) 08/12/2018 1857   PROTEINUR 30 (A) 08/12/2018 1857   NITRITE NEGATIVE 08/12/2018 1857   LEUKOCYTESUR NEGATIVE 08/12/2018 1857   Sepsis Labs: @LABRCNTIP (procalcitonin:4,lacticidven:4) )No results found for this or any previous visit (from the past 240 hour(s)).   Radiological Exams on Admission: Ct Head Wo Contrast  Result Date: 08/12/2018 CLINICAL DATA:  Fall on Thursday EXAM: CT HEAD WITHOUT CONTRAST CT CERVICAL SPINE WITHOUT CONTRAST TECHNIQUE: Multidetector CT imaging of the head and cervical spine was performed following the standard protocol without intravenous contrast. Multiplanar CT image reconstructions of the cervical spine were also generated. COMPARISON:  CT brain 06/06/2018 FINDINGS: CT HEAD FINDINGS Brain: No acute territorial infarction, hemorrhage, or intracranial mass. Mild to moderate atrophy. Mild small vessel ischemic changes of the white matter. Stable ventricle size. Vascular: No hyperdense vessels. Scattered calcifications at the carotid siphon. Skull: No fracture Sinuses/Orbits: No acute finding. Other: None CT CERVICAL SPINE FINDINGS Alignment: No subluxation.  Facet alignment is normal Skull base and vertebrae: No acute fracture. No primary bone lesion or focal pathologic process. Soft tissues and spinal canal: No prevertebral fluid or swelling. No visible canal hematoma. Disc levels:  Mild degenerative changes C4-C5. Upper chest: Negative. Other: Apical emphysema. IMPRESSION: 1. No CT evidence for acute intracranial abnormality. Atrophy and mild small vessel ischemic changes of the white matter. 2. Mild degenerative changes.  No acute osseous abnormality. Electronically Signed   By: Donavan Foil M.D.   On: 08/12/2018 20:44   Ct Cervical Spine Wo Contrast  Result Date: 08/12/2018 CLINICAL DATA:  Fall on Thursday EXAM: CT HEAD WITHOUT CONTRAST CT CERVICAL  SPINE WITHOUT CONTRAST TECHNIQUE: Multidetector CT imaging of the head and cervical spine was performed following the standard protocol without intravenous contrast. Multiplanar CT image reconstructions of the cervical spine were also generated. COMPARISON:  CT brain 06/06/2018 FINDINGS: CT HEAD FINDINGS Brain: No acute territorial infarction, hemorrhage, or intracranial mass. Mild to moderate atrophy. Mild small vessel ischemic changes of the white matter. Stable ventricle size. Vascular: No hyperdense vessels. Scattered calcifications at the carotid siphon. Skull: No fracture Sinuses/Orbits: No acute finding. Other: None CT CERVICAL SPINE FINDINGS Alignment: No subluxation.  Facet alignment is normal Skull base  and vertebrae: No acute fracture. No primary bone lesion or focal pathologic process. Soft tissues and spinal canal: No prevertebral fluid or swelling. No visible canal hematoma. Disc levels:  Mild degenerative changes C4-C5. Upper chest: Negative. Other: Apical emphysema. IMPRESSION: 1. No CT evidence for acute intracranial abnormality. Atrophy and mild small vessel ischemic changes of the white matter. 2. Mild degenerative changes.  No acute osseous abnormality. Electronically Signed   By: Donavan Foil M.D.   On: 08/12/2018 20:44   Dg Chest Port 1 View  Result Date: 08/12/2018 CLINICAL DATA:  Acute RIGHT chest pain following fall. EXAM: PORTABLE CHEST 1 VIEW COMPARISON:  06/21/2018 PET CT, 10/13/2017 chest radiograph and other studies FINDINGS: The cardiomediastinal silhouette is unchanged. RIGHT thoracic surgical changes and scarring again noted. There is no evidence of focal airspace disease, pulmonary edema, suspicious pulmonary nodule/mass, pleural effusion, or pneumothorax. No acute bony abnormalities are identified. Thoracic scoliosis again identified. IMPRESSION: No evidence of acute cardiopulmonary disease. Electronically Signed   By: Margarette Canada M.D.   On: 08/12/2018 19:48   Dg Shoulder  Left  Result Date: 08/12/2018 CLINICAL DATA:  Acute LEFT shoulder pain following fall. Initial encounter. EXAM: LEFT SHOULDER - 2+ VIEW COMPARISON:  None. FINDINGS: A humeral neck fracture is identified with equivocal extension into the head. Apex MEDIAL/anterior angulation is noted. The humeral head appears located. IMPRESSION: Angulated humeral neck fracture which may extend into the head. Electronically Signed   By: Margarette Canada M.D.   On: 08/12/2018 19:50    EKG: Independently reviewed. Sinus tachycardia (rate 100), PVC.   Assessment/Plan  1. Hypotension due to hypovolemia  - MAP in 60's in ED    - Normal lactate and afebrile with clear CXR and urine argues against sepsis, but cultures collected and empiric abx given in ED    - Troponin negative, no murmur, no acute EKG changes to suggest cardiogenic  - No hypoxia or chest pain to suggest PE  - She has dry mucous membranes and was on floor since a fall on 1/30 and this is most likely d/t hypovolemia  - Check cortisol level, continue fluid-resuscitation, monitor in stepdown unit    ADDENDUM:  CT chest/abd/pelvis with no acute findings aside from humerus fracture. Her lungs are clear with no edema or vascular congestion, urine remains dark, and she is improving with aggressive fluid-resuscitation, favoring hypovolemia as etiology.     2. Alcohol dependence with withdrawal  - Patient reports drinking daily since her husband passed  - EMS saw multiple wine bottles on the floor when they picked her up  - She is tremulous and anxious in ED concerning for withdrawal  - Monitor with CIWA, supplement b-vitamins, use Ativan as-needed    3. Proximal left humerus fracture  [address] on 08/10/18 and has been experiencing severe left shoulder pain since  - Plain films with angulated left humeral neck fracture with possible extension into the head  - She is neurovascularly intact distally  - Orthopedic surgery was consulted by ED physician and arm was  immobilized in splint  - Continue supportive care, orthopedic follow-up   4. Macrocytic anemia  - Hgb is 11.0 with MCV 110.4 on admission  - Both indices were normal on most recent prior CBC from 2014  - Likely secondary to alcoholism, possible liver disease  - Anticipate drop on next CBC as she is receiving a 4th liter of IVF at time of admission   - There is a large hematoma at left  shoulder that appears stable from time of presentation   - Type and screen, repeat CBC in am, supplement b-vitamins   5. Hypokalemia  - Serum potassium is 3.2 in setting of alcoholism  - KCl added to IVF, will repeat chem panel in am    6. Elevated LFT's  - Mild elevations in transaminases and bilirubin noted  - Exam benign, likely secondary to alcohol abuse   7. COPD  - No wheezing or dyspnea on admission   - Continue as-needed albuterol     DVT prophylaxis: SCD's  Code Status: Full  Family Communication: Discussed with patient  Consults called: Orthopedic surgery  Admission status: observation     Vianne Bulls, MD [facility] Pager [phone]  If 7PM-7AM, please contact night-coverage [email or web address] Password [facility]  08/13/2018, 12:48 AM

## 2018-08-13 NOTE — Progress Notes (Addendum)
[facility] TEAM 1 - Stepdown/ICU TEAM  [name]  WEX:[id number] DOB: 31-Oct-1950 DOA: 08/12/2018 PCP: Wenda Low, MD    Brief Narrative:  68yo female w/ a hx of hypertension, anxiety, and cancer of the right lung status post RLL lobectomy and chemotherapy who was brought to the ED after she was found down on the floor surrounded by multiple wine bottles and soiled in feces and urine, reporting that she had fallen on 08/10/2018 and was unable to get up. Patient reported she had been drinking wine daily since her husband died, but was unable to quantify further.   In the ED CT head was negative for acute intracranial abnormality and cervical spine CT was negative for acute pathology. Radiographs of the left shoulder reveal angulated humeral neck fracture with possible extension into the humeral head. CT of chest/abdomen/pelvis was negative for acute finding other than the humerus fracture.   Significant Events: 2/2 admit after fall at home w/ prolonged down time   Subjective: Pt is seen for a f/u visit.    Assessment & Plan:  Hypotension - severe dehydration  Alcohol dependence with early alcohol withdrawal  Severe Hypokalemia  Severe Hypomagnesemia   Transaminitis  Proximal left humerus fracture Will require surgical correction - tentative plan is for Monday - should be medically ready once electrolyte disturbances corrected  Macrocytic anemia Now s/p 1U PRBC   COPD  DVT prophylaxis: SCDs Code Status: FULL CODE Family Communication:  Disposition Plan:   Consultants:  Orthopedics   Antimicrobials:  none  Objective: Blood pressure 105/63, pulse 98, temperature 98.3 F (36.8 C), temperature source Axillary, resp. rate (!) 23, height 5' 4.5" (1.638 m), weight 65.1 kg, SpO2 100 %.  Intake/Output Summary (Last 24 hours) at 08/13/2018 1212 Last data filed at 08/13/2018 0900 Gross per 24 hour  Intake 7667 ml  Output 425 ml  Net 7242 ml   Filed Weights   08/13/18  0031 08/13/18 0500  Weight: 52.6 kg 65.1 kg    Examination: Pt was seen for a f/u visit.    CBC: Recent Labs  Lab 08/12/18 1857 08/13/18 0221  WBC 12.8* 7.4  NEUTROABS 10.9* 5.8  HGB 11.0* 7.5*  HCT 34.0* 24.1*  MCV 110.4* 114.2*  PLT 238 938   Basic Metabolic Panel: Recent Labs  Lab 08/12/18 1857 08/13/18 0221  NA 140 140  K 3.2* 2.9*  CL 100 113*  CO2 24 21*  GLUCOSE 128* 88  BUN 15 11  CREATININE 0.51 0.35*  CALCIUM 8.7* 6.4*  MG  --  1.0*   GFR: Estimated Creatinine Clearance: 58.6 mL/min (A) (by C-G formula based on SCr of 0.35 mg/dL (L)).  Liver Function Tests: Recent Labs  Lab 08/12/18 1857 08/13/18 0221  AST 110* 62*  ALT 76* 45*  ALKPHOS 105 63  BILITOT 1.4* 0.9  PROT 6.0* 3.7*  ALBUMIN 3.1* 1.9*   Recent Labs  Lab 08/12/18 1857  LIPASE 20    Coagulation Profile: Recent Labs  Lab 08/12/18 1857  INR 0.95    Cardiac Enzymes: Recent Labs  Lab 08/12/18 1857  CKTOTAL 232    Recent Results (from the past 240 hour(s))  Blood Culture (routine x 2)     Status: None (Preliminary result)   Collection Time: 08/12/18 11:40 PM  Result Value Ref Range Status   Specimen Description BLOOD RIGHT ANTECUBITAL  Final   Special Requests   Final    BOTTLES DRAWN AEROBIC AND ANAEROBIC Blood Culture results may not be  optimal due to an excessive volume of blood received in culture bottles   Culture NO GROWTH < 12 HOURS  Final   Report Status PENDING  Incomplete  Blood Culture (routine x 2)     Status: None (Preliminary result)   Collection Time: 08/12/18 11:56 PM  Result Value Ref Range Status   Specimen Description BLOOD RIGHT ARM  Final   Special Requests   Final    BOTTLES DRAWN AEROBIC ONLY Blood Culture results may not be optimal due to an excessive volume of blood received in culture bottles   Culture NO GROWTH < 12 HOURS  Final   Report Status PENDING  Incomplete  MRSA PCR Screening     Status: None   Collection Time: 08/13/18  5:38 AM    Result Value Ref Range Status   MRSA by PCR NEGATIVE NEGATIVE Final    Comment:        The GeneXpert MRSA Assay (FDA approved for NASAL specimens only), is one component of a comprehensive MRSA colonization surveillance program. It is not intended to diagnose MRSA infection nor to guide or monitor treatment for MRSA infections. Performed at [facility], [place] [address] [place], [place] [address]      Scheduled Meds: . folic acid  1 mg Intravenous Daily  . mouth rinse  15 mL Mouth Rinse BID  . potassium chloride  20 mEq Oral TID WC  . sodium chloride flush  3 mL Intravenous Q12H  . thiamine injection  100 mg Intravenous Daily     LOS: 0 days   Time spent: No Charge  Cherene Altes, MD [facility] Office  [phone] Pager - Text Page per Amion as per below:  On-Call/Text Page:      [email or web address]  If 7PM-7AM, please contact night-coverage [email or web address] 08/13/2018, 12:12 PM

## 2018-08-13 NOTE — ED Notes (Addendum)
Dr. Myna Hidalgo made aware of low blood pressure of 69/54.

## 2018-08-13 NOTE — ED Notes (Signed)
Opyd, MD, Admitting Provider at bedside.

## 2018-08-14 ENCOUNTER — Encounter (HOSPITAL_COMMUNITY)
Admission: EM | Disposition: A | Discharge status: Home Health | Payer: Self-pay | Source: Home / Self Care | Attending: Internal Medicine

## 2018-08-14 ENCOUNTER — Inpatient Hospital Stay (HOSPITAL_COMMUNITY): Payer: PPO

## 2018-08-14 ENCOUNTER — Other Ambulatory Visit: Payer: Self-pay

## 2018-08-14 ENCOUNTER — Inpatient Hospital Stay (HOSPITAL_COMMUNITY): Payer: PPO | Admitting: Certified Registered Nurse Anesthetist

## 2018-08-14 ENCOUNTER — Encounter (HOSPITAL_COMMUNITY): Payer: Self-pay | Admitting: Certified Registered Nurse Anesthetist

## 2018-08-14 DIAGNOSIS — Z96612 Presence of left artificial shoulder joint: Secondary | ICD-10-CM

## 2018-08-14 DIAGNOSIS — E440 Moderate protein-calorie malnutrition: Secondary | ICD-10-CM

## 2018-08-14 HISTORY — PX: REVERSE SHOULDER ARTHROPLASTY: SHX5054

## 2018-08-14 LAB — COMPREHENSIVE METABOLIC PANEL
ALT: 45 U/L — ABNORMAL HIGH (ref 0–44)
AST: 60 U/L — ABNORMAL HIGH (ref 15–41)
Albumin: 2.5 g/dL — ABNORMAL LOW (ref 3.5–5.0)
Alkaline Phosphatase: 71 U/L (ref 38–126)
Anion gap: 9 (ref 5–15)
BUN: 7 mg/dL — AB (ref 8–23)
CO2: 20 mmol/L — ABNORMAL LOW (ref 22–32)
Calcium: 7.8 mg/dL — ABNORMAL LOW (ref 8.9–10.3)
Chloride: 110 mmol/L (ref 98–111)
Creatinine, Ser: 0.31 mg/dL — ABNORMAL LOW (ref 0.44–1.00)
GFR calc Af Amer: >60 mL/min (ref >60)
GFR calc non Af Amer: >60 mL/min (ref >60)
Glucose, Bld: 69 mg/dL — ABNORMAL LOW (ref 70–99)
Potassium: 4.3 mmol/L (ref 3.5–5.1)
Sodium: 139 mmol/L (ref 135–145)
Total Bilirubin: 0.9 mg/dL (ref 0.3–1.2)
Total Protein: 4.6 g/dL — ABNORMAL LOW (ref 6.5–8.1)

## 2018-08-14 LAB — TYPE AND SCREEN
ABO/RH(D): O NEG
Antibody Screen: NEGATIVE
Unit division: 0

## 2018-08-14 LAB — FOLATE: Folate: 10.6 ng/mL (ref >5.9)

## 2018-08-14 LAB — VITAMIN B12: Vitamin B-12: 504 pg/mL (ref 180–914)

## 2018-08-14 LAB — MAGNESIUM: Magnesium: 1.7 mg/dL (ref 1.7–2.4)

## 2018-08-14 LAB — PHOSPHORUS: PHOSPHORUS: 2.3 mg/dL — AB (ref 2.5–4.6)

## 2018-08-14 LAB — IRON AND TIBC
Iron: 13 ug/dL — ABNORMAL LOW (ref 28–170)
Saturation Ratios: 7 % — ABNORMAL LOW (ref 10.4–31.8)
TIBC: 186 ug/dL — ABNORMAL LOW (ref 250–450)
UIBC: 173 ug/dL

## 2018-08-14 LAB — RETICULOCYTES
Immature Retic Fract: 14.4 % (ref 2.3–15.9)
RBC.: 2.74 MIL/uL — ABNORMAL LOW (ref 3.87–5.11)
Retic Count, Absolute: 85.5 10*3/uL (ref 19.0–186.0)
Retic Ct Pct: 3.1 % (ref 0.4–3.1)

## 2018-08-14 LAB — CBC
HEMATOCRIT: 30.2 % — AB (ref 36.0–46.0)
Hemoglobin: 9.4 g/dL — ABNORMAL LOW (ref 12.0–15.0)
MCH: 34.3 pg — ABNORMAL HIGH (ref 26.0–34.0)
MCHC: 31.1 g/dL (ref 30.0–36.0)
MCV: 110.2 fL — ABNORMAL HIGH (ref 80.0–100.0)
Platelets: 159 10*3/uL (ref 150–400)
RBC: 2.74 MIL/uL — ABNORMAL LOW (ref 3.87–5.11)
RDW: 16.1 % — ABNORMAL HIGH (ref 11.5–15.5)
WBC: 7.6 10*3/uL (ref 4.0–10.5)
nRBC: 0 % (ref 0.0–0.2)

## 2018-08-14 LAB — BPAM RBC
Blood Product Expiration Date: 202002112359
ISSUE DATE / TIME: 202002020516
Unit Type and Rh: 9500

## 2018-08-14 LAB — FERRITIN: Ferritin: 427 ng/mL — ABNORMAL HIGH (ref 11–307)

## 2018-08-14 LAB — GLUCOSE, CAPILLARY: Glucose-Capillary: 84 mg/dL (ref 70–99)

## 2018-08-14 SURGERY — ARTHROPLASTY, SHOULDER, TOTAL, REVERSE
Anesthesia: General | Laterality: Left

## 2018-08-14 MED ORDER — ROCURONIUM BROMIDE 50 MG/5ML IV SOSY
PREFILLED_SYRINGE | INTRAVENOUS | Status: DC | PRN
  Administered 2018-08-14: 50 mg via INTRAVENOUS

## 2018-08-14 MED ORDER — METOCLOPRAMIDE HCL 10 MG PO TABS: 5.0000 mg | ORAL_TABLET | Freq: Three times a day (TID) | ORAL | Status: DC | PRN

## 2018-08-14 MED ORDER — IPRATROPIUM-ALBUTEROL 0.5-2.5 (3) MG/3ML IN SOLN: 3.0000 mL | Freq: Three times a day (TID) | RESPIRATORY_TRACT | Status: DC

## 2018-08-14 MED ORDER — MIDAZOLAM HCL 2 MG/2ML IJ SOLN
INTRAMUSCULAR | Status: AC
  Filled 2018-08-14: qty 2

## 2018-08-14 MED ORDER — ESCITALOPRAM OXALATE 10 MG PO TABS
10.0000 mg | ORAL_TABLET | Freq: Every day | ORAL | Status: DC
  Administered 2018-08-16 – 2018-08-26 (×10): 10 mg via ORAL
  Filled 2018-08-14 (×12): qty 1

## 2018-08-14 MED ORDER — PROPOFOL 10 MG/ML IV BOLUS
INTRAVENOUS | Status: AC
  Filled 2018-08-14: qty 20

## 2018-08-14 MED ORDER — METOCLOPRAMIDE HCL 5 MG/ML IJ SOLN
5.0000 mg | Freq: Three times a day (TID) | INTRAMUSCULAR | Status: DC | PRN
  Administered 2018-08-15 – 2018-08-18 (×3): 10 mg via INTRAVENOUS
  Filled 2018-08-14 (×3): qty 2

## 2018-08-14 MED ORDER — HYDROMORPHONE HCL 1 MG/ML IJ SOLN
INTRAMUSCULAR | Status: AC
  Administered 2018-08-14: 1 mg
  Filled 2018-08-14: qty 1

## 2018-08-14 MED ORDER — BUPIVACAINE-EPINEPHRINE 0.5% -1:200000 IJ SOLN
INTRAMUSCULAR | Status: DC | PRN
  Administered 2018-08-14: 10 mL

## 2018-08-14 MED ORDER — PHENYLEPHRINE 40 MCG/ML (10ML) SYRINGE FOR IV PUSH (FOR BLOOD PRESSURE SUPPORT)
PREFILLED_SYRINGE | INTRAVENOUS | Status: DC | PRN
  Administered 2018-08-14 (×2): 80 ug via INTRAVENOUS

## 2018-08-14 MED ORDER — FENTANYL CITRATE (PF) 100 MCG/2ML IJ SOLN
INTRAMUSCULAR | Status: DC | PRN
  Administered 2018-08-14 (×3): 50 ug via INTRAVENOUS

## 2018-08-14 MED ORDER — ROCURONIUM BROMIDE 50 MG/5ML IV SOSY
PREFILLED_SYRINGE | INTRAVENOUS | Status: AC
  Filled 2018-08-14: qty 5

## 2018-08-14 MED ORDER — MIDAZOLAM HCL 2 MG/2ML IJ SOLN
INTRAMUSCULAR | Status: DC | PRN
  Administered 2018-08-14: 2 mg via INTRAVENOUS

## 2018-08-14 MED ORDER — MORPHINE SULFATE (PF) 2 MG/ML IV SOLN: 1.0000 mg | INTRAVENOUS | Status: DC | PRN

## 2018-08-14 MED ORDER — 0.9 % SODIUM CHLORIDE (POUR BTL) OPTIME
TOPICAL | Status: DC | PRN
  Administered 2018-08-14: 1000 mL

## 2018-08-14 MED ORDER — BUPIVACAINE-EPINEPHRINE 0.5% -1:200000 IJ SOLN
INTRAMUSCULAR | Status: AC
  Filled 2018-08-14: qty 1

## 2018-08-14 MED ORDER — PHENYLEPHRINE 40 MCG/ML (10ML) SYRINGE FOR IV PUSH (FOR BLOOD PRESSURE SUPPORT)
PREFILLED_SYRINGE | INTRAVENOUS | Status: AC
  Filled 2018-08-14: qty 10

## 2018-08-14 MED ORDER — LIDOCAINE 2% (20 MG/ML) 5 ML SYRINGE
INTRAMUSCULAR | Status: DC | PRN
  Administered 2018-08-14: 60 mg via INTRAVENOUS

## 2018-08-14 MED ORDER — KETAMINE HCL 10 MG/ML IJ SOLN
INTRAMUSCULAR | Status: DC | PRN
  Administered 2018-08-14: 30 mg via INTRAVENOUS
  Administered 2018-08-14: 10 mg via INTRAVENOUS

## 2018-08-14 MED ORDER — ONDANSETRON HCL 4 MG/2ML IJ SOLN
4.0000 mg | Freq: Four times a day (QID) | INTRAMUSCULAR | Status: DC | PRN
  Administered 2018-08-16 – 2018-08-23 (×5): 4 mg via INTRAVENOUS
  Filled 2018-08-14 (×6): qty 2

## 2018-08-14 MED ORDER — PHENYLEPHRINE HCL 10 MG/ML IJ SOLN
INTRAMUSCULAR | Status: AC
  Filled 2018-08-14: qty 1

## 2018-08-14 MED ORDER — PHENOL 1.4 % MT LIQD: 1.0000 | OROMUCOSAL | Status: DC | PRN

## 2018-08-14 MED ORDER — LIDOCAINE 2% (20 MG/ML) 5 ML SYRINGE
INTRAMUSCULAR | Status: AC
  Filled 2018-08-14: qty 5

## 2018-08-14 MED ORDER — DOCUSATE SODIUM 100 MG PO CAPS
100.0000 mg | ORAL_CAPSULE | Freq: Two times a day (BID) | ORAL | Status: DC
  Administered 2018-08-14 – 2018-08-22 (×15): 100 mg via ORAL
  Filled 2018-08-14 (×17): qty 1

## 2018-08-14 MED ORDER — KETAMINE HCL 50 MG/5ML IJ SOSY
PREFILLED_SYRINGE | INTRAMUSCULAR | Status: AC
  Filled 2018-08-14: qty 5

## 2018-08-14 MED ORDER — FENTANYL CITRATE (PF) 250 MCG/5ML IJ SOLN
INTRAMUSCULAR | Status: AC
  Filled 2018-08-14: qty 5

## 2018-08-14 MED ORDER — TRAMADOL HCL 50 MG PO TABS
50.0000 mg | ORAL_TABLET | Freq: Four times a day (QID) | ORAL | Status: DC | PRN
  Administered 2018-08-15: 50 mg via ORAL
  Filled 2018-08-14: qty 1

## 2018-08-14 MED ORDER — TRANEXAMIC ACID-NACL 1000-0.7 MG/100ML-% IV SOLN
1000.0000 mg | INTRAVENOUS | Status: AC
  Administered 2018-08-14: 1000 mg via INTRAVENOUS
  Filled 2018-08-14: qty 100

## 2018-08-14 MED ORDER — HYDROMORPHONE HCL 1 MG/ML IJ SOLN
INTRAMUSCULAR | Status: AC
  Filled 2018-08-14: qty 1

## 2018-08-14 MED ORDER — SODIUM CHLORIDE 0.9 % IV SOLN
INTRAVENOUS | Status: DC | PRN
  Administered 2018-08-14: 25 ug/min via INTRAVENOUS

## 2018-08-14 MED ORDER — CEFAZOLIN SODIUM-DEXTROSE 2-4 GM/100ML-% IV SOLN
INTRAVENOUS | Status: AC
  Filled 2018-08-14: qty 100

## 2018-08-14 MED ORDER — SUGAMMADEX SODIUM 200 MG/2ML IV SOLN
INTRAVENOUS | Status: DC | PRN
  Administered 2018-08-14: 130 mg via INTRAVENOUS

## 2018-08-14 MED ORDER — KETAMINE HCL 100 MG/ML IJ SOLN
INTRAMUSCULAR | Status: AC
  Filled 2018-08-14: qty 1

## 2018-08-14 MED ORDER — CEFAZOLIN SODIUM-DEXTROSE 2-4 GM/100ML-% IV SOLN
2.0000 g | INTRAVENOUS | Status: AC
  Administered 2018-08-14: 2 g via INTRAVENOUS

## 2018-08-14 MED ORDER — DIPHENHYDRAMINE HCL 50 MG/ML IJ SOLN
INTRAMUSCULAR | Status: AC
  Administered 2018-08-14: 11:00:00
  Filled 2018-08-14: qty 1

## 2018-08-14 MED ORDER — HYDROMORPHONE HCL 1 MG/ML IJ SOLN
0.2500 mg | INTRAMUSCULAR | Status: DC | PRN
  Administered 2018-08-14 (×4): 0.5 mg via INTRAVENOUS

## 2018-08-14 MED ORDER — ONDANSETRON HCL 4 MG/2ML IJ SOLN
INTRAMUSCULAR | Status: DC | PRN
  Administered 2018-08-14: 4 mg via INTRAVENOUS

## 2018-08-14 MED ORDER — PROPOFOL 10 MG/ML IV BOLUS
INTRAVENOUS | Status: DC | PRN
  Administered 2018-08-14: 110 mg via INTRAVENOUS

## 2018-08-14 MED ORDER — ONDANSETRON HCL 4 MG PO TABS
4.0000 mg | ORAL_TABLET | Freq: Four times a day (QID) | ORAL | Status: DC | PRN
  Administered 2018-08-21 – 2018-08-26 (×4): 4 mg via ORAL
  Filled 2018-08-14 (×4): qty 1

## 2018-08-14 MED ORDER — DEXAMETHASONE SODIUM PHOSPHATE 10 MG/ML IJ SOLN
INTRAMUSCULAR | Status: DC | PRN
  Administered 2018-08-14: 10 mg via INTRAVENOUS

## 2018-08-14 MED ORDER — MAGNESIUM SULFATE 2 GM/50ML IV SOLN
2.0000 g | Freq: Once | INTRAVENOUS | Status: AC
  Administered 2018-08-14: 2 g via INTRAVENOUS
  Filled 2018-08-14: qty 50

## 2018-08-14 MED ORDER — FENTANYL CITRATE (PF) 100 MCG/2ML IJ SOLN
INTRAMUSCULAR | Status: AC
  Filled 2018-08-14: qty 2

## 2018-08-14 MED ORDER — DEXAMETHASONE SODIUM PHOSPHATE 10 MG/ML IJ SOLN
INTRAMUSCULAR | Status: AC
  Filled 2018-08-14: qty 1

## 2018-08-14 MED ORDER — HYDROMORPHONE HCL 1 MG/ML IJ SOLN
INTRAMUSCULAR | Status: AC
  Administered 2018-08-14: 15:00:00
  Filled 2018-08-14: qty 1

## 2018-08-14 MED ORDER — BOOST / RESOURCE BREEZE PO LIQD CUSTOM
1.0000 | Freq: Three times a day (TID) | ORAL | Status: DC
  Administered 2018-08-14 – 2018-08-16 (×4): 1 via ORAL

## 2018-08-14 MED ORDER — SODIUM CHLORIDE 0.9 % IV SOLN
INTRAVENOUS | Status: DC | PRN
  Administered 2018-08-14: 13:00:00 via INTRAVENOUS

## 2018-08-14 MED ORDER — MENTHOL 3 MG MT LOZG: 1.0000 | LOZENGE | OROMUCOSAL | Status: DC | PRN

## 2018-08-14 MED ORDER — ATORVASTATIN CALCIUM 10 MG PO TABS
10.0000 mg | ORAL_TABLET | Freq: Every day | ORAL | Status: DC
  Administered 2018-08-15 – 2018-08-22 (×8): 10 mg via ORAL
  Filled 2018-08-14 (×8): qty 1

## 2018-08-14 MED ORDER — LACTATED RINGERS IV SOLN
INTRAVENOUS | Status: DC
  Administered 2018-08-14: 12:00:00 via INTRAVENOUS

## 2018-08-14 MED ORDER — DIPHENHYDRAMINE HCL 50 MG/ML IJ SOLN
25.0000 mg | Freq: Four times a day (QID) | INTRAMUSCULAR | Status: DC | PRN
  Administered 2018-08-14: 25 mg via INTRAVENOUS

## 2018-08-14 MED ORDER — CHLORHEXIDINE GLUCONATE 4 % EX LIQD
60.0000 mL | Freq: Once | CUTANEOUS | Status: AC
  Administered 2018-08-14: 4 via TOPICAL

## 2018-08-14 MED ORDER — ONDANSETRON HCL 4 MG/2ML IJ SOLN
INTRAMUSCULAR | Status: AC
  Filled 2018-08-14: qty 2

## 2018-08-14 SURGICAL SUPPLY — 75 items
AID PSTN UNV HD RSTRNT DISP (MISCELLANEOUS) ×1
BASEPLATE GLENOSPHERE 25 (Plate) ×1 IMPLANT
BASEPLATE GLENOSPHERE 25MM (Plate) ×1 IMPLANT
BEARING HUMERAL SHLDER 36M STD (Shoulder) IMPLANT
BIT DRILL 5/64X5 DISP (BIT) ×3 IMPLANT
BIT DRILL TWIST 2.7 (BIT) ×1 IMPLANT
BIT DRILL TWIST 2.7MM (BIT) ×1
BLADE SAG 18X100X1.27 (BLADE) ×3 IMPLANT
BRNG HUM STD 36 RVRS SHLDR (Shoulder) ×1 IMPLANT
CLOSURE WOUND 1/2 X4 (GAUZE/BANDAGES/DRESSINGS) ×1
COVER SURGICAL LIGHT HANDLE (MISCELLANEOUS) ×3 IMPLANT
COVER WAND RF STERILE (DRAPES) ×3 IMPLANT
DRAPE INCISE IOBAN 66X45 STRL (DRAPES) ×3 IMPLANT
DRAPE ORTHO SPLIT 77X108 STRL (DRAPES) ×9
DRAPE SURG ORHT 6 SPLT 77X108 (DRAPES) ×2 IMPLANT
DRAPE U-SHAPE 47X51 STRL (DRAPES) ×3 IMPLANT
DRSG ADAPTIC 3X8 NADH LF (GAUZE/BANDAGES/DRESSINGS) ×3 IMPLANT
DRSG PAD ABDOMINAL 8X10 ST (GAUZE/BANDAGES/DRESSINGS) ×3 IMPLANT
DURAPREP 26ML APPLICATOR (WOUND CARE) ×3 IMPLANT
ELECT BLADE 4.0 EZ CLEAN MEGAD (MISCELLANEOUS) ×6
ELECT NDL TIP 2.8 STRL (NEEDLE) ×1 IMPLANT
ELECT NEEDLE TIP 2.8 STRL (NEEDLE) ×3 IMPLANT
ELECT REM PT RETURN 9FT ADLT (ELECTROSURGICAL) ×3
ELECTRODE BLDE 4.0 EZ CLN MEGD (MISCELLANEOUS) ×1 IMPLANT
ELECTRODE REM PT RTRN 9FT ADLT (ELECTROSURGICAL) ×1 IMPLANT
GAUZE SPONGE 4X4 12PLY STRL (GAUZE/BANDAGES/DRESSINGS) ×3 IMPLANT
GAUZE SPONGE 4X4 12PLY STRL LF (GAUZE/BANDAGES/DRESSINGS) ×2 IMPLANT
GLENOID SPHERE STD STRL 36MM (Orthopedic Implant) ×2 IMPLANT
GLOVE BIOGEL PI ORTHO PRO 7.5 (GLOVE) ×2
GLOVE BIOGEL PI ORTHO PRO SZ8 (GLOVE) ×2
GLOVE ORTHO TXT STRL SZ7.5 (GLOVE) ×3 IMPLANT
GLOVE PI ORTHO PRO STRL 7.5 (GLOVE) ×1 IMPLANT
GLOVE PI ORTHO PRO STRL SZ8 (GLOVE) ×1 IMPLANT
GLOVE SURG ORTHO 8.5 STRL (GLOVE) ×3 IMPLANT
GOWN STRL REUS W/ TWL LRG LVL3 (GOWN DISPOSABLE) ×1 IMPLANT
GOWN STRL REUS W/ TWL XL LVL3 (GOWN DISPOSABLE) ×2 IMPLANT
GOWN STRL REUS W/TWL LRG LVL3 (GOWN DISPOSABLE) ×3
GOWN STRL REUS W/TWL XL LVL3 (GOWN DISPOSABLE) ×6
KIT BASIN OR (CUSTOM PROCEDURE TRAY) ×3 IMPLANT
KIT TURNOVER KIT B (KITS) ×3 IMPLANT
MANIFOLD NEPTUNE II (INSTRUMENTS) ×3 IMPLANT
NDL 1/2 CIR MAYO (NEEDLE) ×1 IMPLANT
NDL HYPO 25GX1X1/2 BEV (NEEDLE) ×1 IMPLANT
NEEDLE 1/2 CIR MAYO (NEEDLE) ×3 IMPLANT
NEEDLE HYPO 25GX1X1/2 BEV (NEEDLE) ×3 IMPLANT
NS IRRIG 1000ML POUR BTL (IV SOLUTION) ×3 IMPLANT
PACK SHOULDER (CUSTOM PROCEDURE TRAY) ×3 IMPLANT
PAD ABD 8X10 STRL (GAUZE/BANDAGES/DRESSINGS) ×4 IMPLANT
PAD ARMBOARD 7.5X6 YLW CONV (MISCELLANEOUS) ×6 IMPLANT
PENCIL BUTTON HOLSTER BLD 10FT (ELECTRODE) ×2 IMPLANT
PIN THREADED REVERSE (PIN) ×2 IMPLANT
RESTRAINT HEAD UNIVERSAL NS (MISCELLANEOUS) ×3 IMPLANT
SCREW BONE LOCKING 4.75X30X3.5 (Screw) ×4 IMPLANT
SCREW BONE STRL 6.5MMX25MM (Screw) ×2 IMPLANT
SHOULDER HUMERAL BEAR 36M STD (Shoulder) ×3 IMPLANT
SPONGE LAP 18X18 X RAY DECT (DISPOSABLE) IMPLANT
SPONGE LAP 4X18 RFD (DISPOSABLE) ×3 IMPLANT
STEM HUMERAL STRL 12MMX122MM (Stem) ×2 IMPLANT
STRIP CLOSURE SKIN 1/2X4 (GAUZE/BANDAGES/DRESSINGS) ×2 IMPLANT
SUCTION FRAZIER HANDLE 10FR (MISCELLANEOUS) ×4
SUCTION TUBE FRAZIER 10FR DISP (MISCELLANEOUS) ×1 IMPLANT
SUT FIBERWIRE #2 38 T-5 BLUE (SUTURE)
SUT MAXBRAID (SUTURE) ×14 IMPLANT
SUT MNCRL AB 4-0 PS2 18 (SUTURE) ×3 IMPLANT
SUT VIC AB 0 CT2 27 (SUTURE) ×3 IMPLANT
SUT VIC AB 2-0 CT1 27 (SUTURE) ×3
SUT VIC AB 2-0 CT1 TAPERPNT 27 (SUTURE) ×1 IMPLANT
SUT VICRYL 0 CT 1 36IN (SUTURE) ×3 IMPLANT
SUTURE FIBERWR #2 38 T-5 BLUE (SUTURE) IMPLANT
SYR CONTROL 10ML LL (SYRINGE) ×3 IMPLANT
TOWEL OR 17X24 6PK STRL BLUE (TOWEL DISPOSABLE) ×3 IMPLANT
TOWEL OR 17X26 10 PK STRL BLUE (TOWEL DISPOSABLE) ×3 IMPLANT
TOWER CARTRIDGE SMART MIX (DISPOSABLE) IMPLANT
TRAY HUM MINI SHOULDER +0 40D (Shoulder) ×2 IMPLANT
YANKAUER SUCT BULB TIP NO VENT (SUCTIONS) ×3 IMPLANT

## 2018-08-14 NOTE — Progress Notes (Signed)
   08/14/18 0900  Clinical Encounter Type  Visited With Patient  Visit Type Initial  Responded to [facility] consult for AD. Patient said she does not need one at this time. Patient was alert and expressed her concerns with clarity.

## 2018-08-14 NOTE — Anesthesia Procedure Notes (Signed)
Procedure Name: Intubation Date/Time: 08/14/2018 12:34 PM Performed by: Candis Shine, CRNA Pre-anesthesia Checklist: Patient identified, Emergency Drugs available, Suction available and Patient being monitored Patient Re-evaluated:Patient Re-evaluated prior to induction Oxygen Delivery Method: Circle System Utilized Preoxygenation: Pre-oxygenation with 100% oxygen Induction Type: IV induction Ventilation: Mask ventilation without difficulty Laryngoscope Size: Mac and 3 Grade View: Grade I Tube type: Oral Tube size: 7.0 mm Number of attempts: 1 Airway Equipment and Method: Stylet Placement Confirmation: ETT inserted through vocal cords under direct vision,  positive ETCO2 and breath sounds checked- equal and bilateral Secured at: 22 cm Tube secured with: Tape Dental Injury: Teeth and Oropharynx as per pre-operative assessment

## 2018-08-14 NOTE — Progress Notes (Signed)
Orthopedics Progress Note  Subjective: Patient still complaining of left shoulder pain  Objective:  Vitals:   08/13/18 2300 08/14/18 0300  BP: 109/67 125/74  Pulse: 93 88  Resp: (!) 32 (!) 25  Temp: 97.8 F (36.6 C) 98 F (36.7 C)  SpO2: 96% 100%    General: Awake and alert  Musculoskeletal: bruising and swelling noted left shoulder, limited ROM due to pain, elbow and wrist ROM without pain,  Neurovascularly intact  Lab Results  Component Value Date   WBC 7.6 08/14/2018   HGB 9.4 (L) 08/14/2018   HCT 30.2 (L) 08/14/2018   MCV 110.2 (H) 08/14/2018   PLT 159 08/14/2018       Component Value Date/Time   NA 139 08/14/2018 0204   NA 144 03/26/2009 0824   K 4.3 08/14/2018 0204   K 4.7 03/26/2009 0824   CL 110 08/14/2018 0204   CL 106 03/26/2009 0824   CO2 20 (L) 08/14/2018 0204   CO2 31 03/26/2009 0824   GLUCOSE 69 (L) 08/14/2018 0204   GLUCOSE 96 03/26/2009 0824   BUN 7 (L) 08/14/2018 0204   BUN 9 03/26/2009 0824   CREATININE 0.31 (L) 08/14/2018 0204   CREATININE 0.5 (L) 03/26/2009 0824   CALCIUM 7.8 (L) 08/14/2018 0204   CALCIUM 9.4 03/26/2009 0824   GFRNONAA >60 08/14/2018 0204   GFRAA >60 08/14/2018 0204    Lab Results  Component Value Date   INR 0.95 08/12/2018    Assessment/Plan: S/p fall with displaced proximal humerus fracture on the left. Patient coughing a fair amount today. Will repeat a CXR.  Continue medical optimization.  Surgery planned for noon today.  Maintain NPO Plan REVERSE SHOULDER ARTHROPLASTY.  Explained to the patient the need for surgery. If no surgery then she will have a chronically painful, non-functional shoulder   Doran Heater. Veverly Fells, MD 08/14/2018 7:07 AM

## 2018-08-14 NOTE — Progress Notes (Addendum)
TEAM 1 - Stepdown/ICU TEAM  [name]  HYW:[id number] DOB: October 12, 1950 DOA: 08/12/2018 PCP: Wenda Low, MD    Brief Narrative:  68yo female w/ a hx of hypertension, anxiety, and cancer of the right lung status post chemotherapy who was brought to the ED after she was found down on the floor surrounded by multiple wine bottles and soiled in feces and urine, reporting that she had fallen on 08/10/2018 and was unable to get up. Patient reported she had been drinking wine daily since her husband died, but was unable to quantify further.   In the ED CT head was negative for acute intracranial abnormality and cervical spine CT was negative for acute pathology. Radiographs of the left shoulder reveal angulated humeral neck fracture with possible extension into the humeral head. CT of chest/abdomen/pelvis was negative for acute finding other than the humerus fracture.   Significant Events: 2/2 admit after fall at home w/ prolonged down time  2/3 OR - reverse left shoulder arthroplasty with tuberosity repair  Subjective: The patient is seen in her room after having returned from the OR.  She is much more alert today.  She is conversant and pleasant though very tremulous.  She reports ongoing pain in her left shoulder.  She denies headache acute visual change or other focal neurologic deficits.  She denies chest pain or shortness of breath.  Assessment & Plan:  Hypotension - severe dehydration Corrected with volume resuscitation and albumin infusion  Alcohol dependence with early alcohol withdrawal Appears to be in mild to modest withdrawal -continue CIWA protocol  Possible L basilar PNA Empirically tx as an aspiration event - follow   Severe Hypokalemia Corrected -anticipate significant total body deficit -continue to follow  Severe Hypomagnesemia  Corrected -anticipate significant total body deficit -continue to follow  Transaminitis Likely due to a combination of chronic  heavy alcohol intake as well as mild shock liver -steadily improving  Proximal left humerus fracture Taken to the OR today -ongoing care per orthopedics  Macrocytic anemia Now s/p 1U PRBC -hemoglobin appears to be stabilizing  COPD Mild wheezing on exam -adjust medical therapy  DVT prophylaxis: SCDs Code Status: FULL CODE Family Communication: No family present Disposition Plan: SDU  Consultants:  Orthopedics   Antimicrobials:  none  Objective: Blood pressure (!) 129/108, pulse (!) 105, temperature 97.7 F (36.5 C), temperature source Oral, resp. rate 20, height 5' 4.5" (1.638 m), weight 65.1 kg, SpO2 90 %.  Intake/Output Summary (Last 24 hours) at 08/14/2018 1800 Last data filed at 08/14/2018 1428 Gross per 24 hour  Intake 2584.54 ml  Output 1250 ml  Net 1334.54 ml   Filed Weights   08/13/18 0031 08/13/18 0500  Weight: 52.6 kg 65.1 kg    Examination: General: No acute respiratory distress - tremulous  Lungs: mild exp wheezing - no crackles  Cardiovascular: Rtachycardic - regular - no M or rub  Abdomen: Nontender, nondistended, soft, bowel sounds positive, no rebound, no ascites, no appreciable mass Extremities: No significant cyanosis, clubbing, or edema bilateral lower extremities  CBC: Recent Labs  Lab 08/12/18 1857 08/13/18 0221 08/13/18 1943 08/14/18 0204  WBC 12.8* 7.4 8.2 7.6  NEUTROABS 10.9* 5.8  --   --   HGB 11.0* 7.5* 9.8* 9.4*  HCT 34.0* 24.1* 30.8* 30.2*  MCV 110.4* 114.2* 108.5* 110.2*  PLT 238 167 177 485   Basic Metabolic Panel: Recent Labs  Lab 08/12/18 1857 08/13/18 0221 08/13/18 1943 08/14/18 0204  NA 140 140 138  139  K 3.2* 2.9* 4.3 4.3  CL 100 113* 109 110  CO2 24 21* 23 20*  GLUCOSE 128* 88 72 69*  BUN 15 11 7* 7*  CREATININE 0.51 0.35* 0.37* 0.31*  CALCIUM 8.7* 6.4* 7.7* 7.8*  MG  --  1.0* 2.1 1.7  PHOS  --   --   --  2.3*   GFR: Estimated Creatinine Clearance: 58.6 mL/min (A) (by C-G formula based on SCr of 0.31 mg/dL  (L)).  Liver Function Tests: Recent Labs  Lab 08/12/18 1857 08/13/18 0221 08/14/18 0204  AST 110* 62* 60*  ALT 76* 45* 45*  ALKPHOS 105 63 71  BILITOT 1.4* 0.9 0.9  PROT 6.0* 3.7* 4.6*  ALBUMIN 3.1* 1.9* 2.5*   Recent Labs  Lab 08/12/18 1857  LIPASE 20    Coagulation Profile: Recent Labs  Lab 08/12/18 1857  INR 0.95    Cardiac Enzymes: Recent Labs  Lab 08/12/18 1857  CKTOTAL 232    Recent Results (from the past 240 hour(s))  Blood Culture (routine x 2)     Status: None (Preliminary result)   Collection Time: 08/12/18 11:40 PM  Result Value Ref Range Status   Specimen Description BLOOD RIGHT ANTECUBITAL  Final   Special Requests   Final    BOTTLES DRAWN AEROBIC AND ANAEROBIC Blood Culture results may not be optimal due to an excessive volume of blood received in culture bottles   Culture   Final    NO GROWTH 1 DAY Performed at [facility], [place] [address] [place], [place] [address]    Report Status PENDING  Incomplete  Blood Culture (routine x 2)     Status: None (Preliminary result)   Collection Time: 08/12/18 11:56 PM  Result Value Ref Range Status   Specimen Description BLOOD RIGHT ARM  Final   Special Requests   Final    BOTTLES DRAWN AEROBIC ONLY Blood Culture results may not be optimal due to an excessive volume of blood received in culture bottles   Culture   Final    NO GROWTH 1 DAY Performed at [facility], [place] [address], [place], [place] [address]    Report Status PENDING  Incomplete  MRSA PCR Screening     Status: None   Collection Time: 08/13/18  5:38 AM  Result Value Ref Range Status   MRSA by PCR NEGATIVE NEGATIVE Final    Comment:        The GeneXpert MRSA Assay (FDA approved for NASAL specimens only), is one component of a comprehensive MRSA colonization surveillance program. It is not intended to diagnose MRSA infection nor to guide or monitor treatment for MRSA infections. Performed at [facility],  [place] [address] [place], [place] [address]      Scheduled Meds: . atorvastatin  10 mg Oral q1800  . docusate sodium  100 mg Oral BID  . escitalopram  10 mg Oral Daily  . feeding supplement  1 Container Oral TID BM  . folic acid  1 mg Intravenous Daily  . HYDROmorphone      . mouth rinse  15 mL Mouth Rinse BID  . metoprolol tartrate  2.5 mg Intravenous Q8H  . sodium chloride flush  3 mL Intravenous Q12H  . thiamine injection  100 mg Intravenous Daily     LOS: 1 day    Cherene Altes, MD [facility] Office  [phone] Pager - Text Page per Shea Evans as per below:  On-Call/Text  Page:      [email or web address]  If 7PM-7AM, please contact night-coverage [email or web address] 08/14/2018, 6:00 PM

## 2018-08-14 NOTE — Progress Notes (Signed)
Patient c/o of itching after fentanyl was given.   IV benadryl given with relief.

## 2018-08-14 NOTE — Progress Notes (Signed)
Initial Nutrition Assessment  DOCUMENTATION CODES:   Non-severe (moderate) malnutrition in context of social or environmental circumstances  INTERVENTION:   - Once diet advanced, add Boost Breeze TID (each provides 250 kcal, 9 g protein) - Continue thiamine and folic acid supplementation   NUTRITION DIAGNOSIS:   Moderate Malnutrition related to social / environmental circumstances as evidenced by energy intake < 75% for > or equal to 3 months, mild fat depletion, mild muscle depletion.   GOAL:   Patient will meet greater than or equal to 90% of their needs   MONITOR:   PO intake, Diet advancement, Supplement acceptance, Weight trends, Labs  REASON FOR ASSESSMENT:   Malnutrition Screening Tool    ASSESSMENT:   68 yo female, admitted with alcohol dependence with withdrawal. PMH significant for HTN, lung cancer s/p chemotherapy, anemia.  Labs: glucose 69, BUN and Creatinine low, phosphorus 2.3 (L), AST and ALT elevated, tProtein 4.6 Meds: folic acid 1 mg injection daily, thiamine 100 mg injection daily, NS with KCl 30 mL/hr continuous  Pt alert and resting in bed at time of visit. Unable to state exactly why her appetite has been poor, but that it has not been the same since her husband passed away. Suspect it has been poor for a variety of reasons (illness, fatigue). Does not follow any special diet at home and has not been taking MVI regularly. States she has not had anything to eat since Thursday.  UBW ~120# (54.4 kg). Current bed wt 60.6 kg. Pt is +6.6 L since admission. Denies recent wt loss. Limited wt hx available. No wt loss noted in chart.   Pt unsure about last BM.   Pt amenable to trying Boost Breeze once diet advances. Currently NPO for procedure later today.   NUTRITION - FOCUSED PHYSICAL EXAM:    Most Recent Value  Orbital Region  Mild depletion  Upper Arm Region  No depletion  Thoracic and Lumbar Region  Unable to assess  Buccal Region  No depletion   Temple Region  Mild depletion  Clavicle Bone Region  Mild depletion  Clavicle and Acromion Bone Region  Unable to assess  Scapular Bone Region  Unable to assess  Dorsal Hand  Mild depletion  Patellar Region  Mild depletion  Anterior Thigh Region  Moderate depletion  Posterior Calf Region  Moderate depletion  Edema (RD Assessment)  None  Hair  Reviewed  Eyes  Reviewed  Mouth  Reviewed  Skin  Other (Comment) bruising  Nails  Reviewed      Diet Order:  2/2 clear liquid diet: 25-50% x 2 meals, per nsg documentation Diet Order            Diet NPO time specified  Diet effective midnight        Diet NPO time specified  Diet effective now              EDUCATION NEEDS:   No education needs have been identified at this time  Skin:  Skin Assessment: Skin Integrity Issues: Skin Integrity Issues:: Other (Comment) Other: Abrasion L arm, leg; cellulitis BL lower legs; ecchymosis L chest, arm, shoulder  Last BM:  PTA  Height:   Ht Readings from Last 1 Encounters:  08/13/18 5' 4.5" (1.638 m)    Weight:   Wt Readings from Last 1 Encounters:  08/13/18 65.1 kg    Ideal Body Weight:  55.7 kg  BMI:  Body mass index is 24.25 kg/m.  Estimated Nutritional Needs:   Kcal:  3267-1245 (25-30 kcal/kg ABW)  Protein:  65-85 gm (1.0-1.3 g/kg ABW)  Fluid:  1 mL/kcal or per MD  Althea Grimmer, MS, RDN, LDN Pager: [phone] Available Mondays and Fridays, 9am-2pm

## 2018-08-14 NOTE — Progress Notes (Signed)
Dietician came to speak with patient about her appetite.  Nurse spoke with dietician after she left patient's room and explained about NPO for surgery today.  Dietician to order boost/rebreeze for after surgery.  Patient has not complained of hunger today.

## 2018-08-14 NOTE — Op Note (Signed)
NAME: [name], [name] MEDICAL RECORD [id number] ACCOUNT [id number] DATE OF BIRTH:09/14/1950 FACILITY: [facility] LOCATION: [place], [place]  OPERATIVE REPORT  DATE OF PROCEDURE:  08/14/2018  PREOPERATIVE DIAGNOSIS:  Left displaced proximal humerus fracture.  POSTOPERATIVE DIAGNOSIS:  Left displaced proximal humerus fracture.  PROCEDURE PERFORMED:  Left reverse total shoulder arthroplasty using Biomet Comprehensive System with tuberosity repair.  ATTENDING SURGEON:  Esmond Plants, MD  ASSISTANT:  Darol Destine, [place], who was scrubbed during the entire procedure and necessary for satisfactory completion of surgery.  ANESTHESIA:  General anesthesia was used plus local anesthesia.  ESTIMATED BLOOD LOSS:  Less than 100 mL.  FLUID REPLACEMENT:  1500 mL crystalloid.  INSTRUMENT COUNTS:  Correct.  COMPLICATIONS:  None.  ANTIBIOTICS:  Perioperative antibiotics were given.  INDICATIONS:  The patient is a 68 year old female who suffered a ground-level fall, injuring her left shoulder 4 days ago.  The patient presented to the emergency department in a subacute fashion having been found down and was admitted to the medical  step-down unit by internal medicine for medical optimization and electrolyte correction.  The patient was noted to have a displaced proximal humerus fracture with the humeral head rotated 90 degrees perpendicular to the long axis of the shaft and the  shaft displaced 100% anteriorly and contracted proximally.  There was evidence that the head was scraped out, and essentially there was very little bone on the backside of the head and metaphyseal bone, and the decision was made to proceed with reverse  shoulder replacement.  I discussed with the patient the need to proceed with reverse shoulder replacement to restore fixed focal mechanics and to restore functional use of the arm.  The nonoperative option was discussed, which was basically pain   management and the patient having a chronically painful shoulder and arm and not being able to use her arm.  She decided to proceed with surgical treatment.  Risks and benefits of surgery were discussed.  Informed consent obtained.  DESCRIPTION OF PROCEDURE:  After an adequate level of anesthesia was achieved, the patient was positioned in the modified beach chair position, left shoulder correctly identified, and sterilely prepped and draped in the usual manner.  Timeout called.  We  used a standard deltopectoral incision starting at the coracoid process, extending down to the anterior humerus, dissection down through subcutaneous tissues.  We identified cephalic vein, took it laterally with the deltoid pectoralis taken medially.   We identified the conjoined tendon and retracted that medially.  We next identified the fractured humerus.  There was an old fracture hematoma which was evacuated.  We were able to dissect free the lesser tuberosity and the subscapularis and placed #2  MaxBraid suture medial to the lesser tuberosity in a mattress fashion, and then we debrided that tuberosity so it was nice and thin.  We then removed the head using an osteotome, and we identified the greater tuberosity.  We removed the supraspinatus and  that portion of tuberosity so we had infraspinatus and teres minor.  We placed #2 MaxBraid lateral to the greater tuberosity, getting good tendon purchase in a mattress fashion so the sutures came over the top of the tuberosity.  Once we had the head  out, we morcellized the cancellous bone for bone grafting at the end of the procedure.  We directed our attention towards the glenoid face.  We removed some of the capsule such that we had a 360-degree exposure of the glenoid.  We removed the cartilage  from the glenoid with a Cobb elevator and rongeur.  We got that down to good glenoid bone and placed our central guide pin for the Biomet Comprehensive Reverse System.  Once we had  that, we reamed for the baseplate and then impacted the 25 mm small  baseplate into position, paying attention to the 12:00 and 6:00 position.  We then placed locked screws, 36 each superiorly and inferiorly, and we also had a 25 mm 6.5 mm central lag screw.  The 2 superior and inferior screws were locked screws.  The  central lag screw was a compression screw.  We had excellent baseplate security.  We selected a 36 standard glenosphere and placed the offset on the B setting and then dialed that offset anteriorly and inferiorly.  We had excellent coverage with that  glenosphere and impacted the glenosphere into position on the [facility] taper, and it fit well.  We then directed our attention towards the humeral side.  We reamed up to a size 12 stem and then placed a fracture stem in 30 degrees of retroversion and then  used a small humeral tray and the smallest poly insert and dialed that laterally.  Once we had the tray and insert in place, we reduced the shoulder, had a nice pop as it reduced.  It was quite stable.  We went ahead and reduced our tuberosities, and  they looked like they would fit perfectly for the exposed portion of the Porocoat stem.  We removed the trial stem, irrigated thoroughly, drilled drill holes in the humeral shaft for assistance with tuberosity repair, and placed 2 MaxBraid sutures  through that.  We then took an around-the-world stitch placed through the medial fin of the implant and impacted the implant into position in 30 degrees of retroversion with excellent implant security and felt like we had good press-fit with the Porocoat  stem.  We then selected the real small tray and the smallest poly and also impacted that into position with the thin portion laterally.  We reduced the shoulder again, pleased with our soft tissue balancing and stability.  The conjoined was  appropriately tensioned.  No gapping with inferior pole or external rotation, and then we did a meticulous  tuberosity repair after bone grafting extensively the anterior lateral portion of the exposed implant.  We then did our tuberosity repair using  sutures that were in the bone in the humeral shaft and also the around-the-world stitch around the back of the entire implant lateral to the greater tuberosity, medial to the lesser tuberosity.  Then we did tuberosity-to-tuberosity repair with the  sutures we placed, the MaxBraid sutures, earlier.  We were extremely pleased with anatomic tuberosity repair.  We ranged the shoulder.  There was no impingement, everything moving together as a unit.  We irrigated thoroughly and then repaired the  deltopectoral interval with 0 Vicryl suture, followed by 2-0 Vicryl for subcutaneous closure and 4-0 Monocryl for skin.  Steri-Strips applied, followed by a sterile dressing.  The patient tolerated surgery well.  LN/NUANCE  D:08/14/2018 T:08/14/2018 JOB:[id number]

## 2018-08-14 NOTE — Brief Op Note (Signed)
08/14/2018  2:35 PM  PATIENT:  [name]  68 y.o. female  PRE-OPERATIVE DIAGNOSIS:  left shoulder displaced proximal humerus fracture  POST-OPERATIVE DIAGNOSIS:  same  PROCEDURE:  Procedure(s): REVERSE SHOULDER ARTHROPLASTY (Left) with tuberosity repair, Biomet Comprehensive  SURGEON:  Surgeon(s) and Role:    Netta Cedars, MD - Primary  PHYSICIAN ASSISTANT:   ASSISTANTS: [place] Bruns, PA-C   ANESTHESIA:   General   EBL:  250 mL   BLOOD ADMINISTERED:none  DRAINS: none   LOCAL MEDICATIONS USED:  MARCAINE     SPECIMEN:  No Specimen  DISPOSITION OF SPECIMEN:  N/A  COUNTS:  YES  TOURNIQUET:  * No tourniquets in log *  DICTATION: .Other Dictation: Dictation Number [id number]  PLAN OF CARE: Admit to inpatient   PATIENT DISPOSITION:  PACU - hemodynamically stable.   Delay start of Pharmacological VTE agent (>24hrs) due to surgical blood loss or risk of bleeding: not applicable

## 2018-08-14 NOTE — Anesthesia Postprocedure Evaluation (Signed)
Anesthesia Post Note  Patient: [name]  Procedure(s) Performed: REVERSE SHOULDER ARTHROPLASTY (Left )     Patient location during evaluation: PACU Anesthesia Type: General Level of consciousness: awake and alert Pain management: pain level controlled Vital Signs Assessment: post-procedure vital signs reviewed and stable Respiratory status: spontaneous breathing, nonlabored ventilation, respiratory function stable and patient connected to nasal cannula oxygen Cardiovascular status: blood pressure returned to baseline and stable Postop Assessment: no apparent nausea or vomiting Anesthetic complications: no    Last Vitals:  Vitals:   08/14/18 1533 08/14/18 1558  BP: (!) 147/76   Pulse: 92   Resp: (!) 21   Temp:  36.4 C  SpO2: 98%     Last Pain:  Vitals:   08/14/18 1558  TempSrc:   PainSc: Asleep                 Karem Tomaso,W. EDMOND

## 2018-08-14 NOTE — Discharge Instructions (Signed)
Ice to the shoulder constantly.  Keep the incision covered and clean and dry for one week, then ok to get it wet in the shower. ° °Do exercise as instructed several times per day. ° °DO NOT reach behind your back or push up out of a chair with the operative arm. ° °Use a sling while you are up and around for comfort, may remove while seated.  Keep pillow propped behind the operative elbow. ° °Follow up with Dr Marykathleen Russi in two weeks in the office, call [phone] for appt °

## 2018-08-14 NOTE — Anesthesia Preprocedure Evaluation (Addendum)
Anesthesia Evaluation  Patient identified by MRN, date of birth, ID band Patient awake    Reviewed: Allergy & Precautions, H&P , NPO status , Patient's Chart, lab work & pertinent test results  Airway Mallampati: II  TM Distance: >3 FB Neck ROM: Full    Dental no notable dental hx. (+) Poor Dentition, Dental Advisory Given   Pulmonary COPD,  COPD inhaler, Current Smoker,    Pulmonary exam normal breath sounds clear to auscultation       Cardiovascular hypertension,  Rhythm:Regular Rate:Normal     Neuro/Psych negative neurological ROS  negative psych ROS   GI/Hepatic negative GI ROS, (+)     substance abuse  alcohol use,   Endo/Other  negative endocrine ROS  Renal/GU negative Renal ROS  negative genitourinary   Musculoskeletal   Abdominal   Peds  Hematology  (+) Blood dyscrasia, anemia ,   Anesthesia Other Findings   Reproductive/Obstetrics negative OB ROS                            Anesthesia Physical Anesthesia Plan  ASA: II  Anesthesia Plan: General   Post-op Pain Management:    Induction: Intravenous  PONV Risk Score and Plan: 3 and Ondansetron, Dexamethasone and Midazolam  Airway Management Planned: Oral ETT  Additional Equipment:   Intra-op Plan:   Post-operative Plan: Extubation in OR  Informed Consent: I have reviewed the patients History and Physical, chart, labs and discussed the procedure including the risks, benefits and alternatives for the proposed anesthesia with the patient or authorized representative who has indicated his/her understanding and acceptance.     Dental advisory given  Plan Discussed with: CRNA  Anesthesia Plan Comments:       Anesthesia Quick Evaluation

## 2018-08-14 NOTE — Progress Notes (Signed)
Patient is awake c/o of pain in left shoulder at a 9 nausea without vomit and milk anxiety. Patient is very shaky, has a strong non productive cough and wanders in thought at times but otherwise overall pleasant. Will reassess CIWA, oral pain medication and zofran given. Nebulizer treatment given for expiratory wheezing.

## 2018-08-14 NOTE — Transfer of Care (Signed)
Immediate Anesthesia Transfer of Care Note  Patient: [name]  Procedure(s) Performed: REVERSE SHOULDER ARTHROPLASTY (Left )  Patient Location: PACU  Anesthesia Type:General  Level of Consciousness: awake and alert   Airway & Oxygen Therapy: Patient Spontanous Breathing and Patient connected to nasal cannula oxygen  Post-op Assessment: Report given to RN and Post -op Vital signs reviewed and stable  Post vital signs: Reviewed and stable  Last Vitals:  Vitals Value Taken Time  BP 80/68 08/14/2018  2:49 PM  Temp    Pulse 99 08/14/2018  2:52 PM  Resp    SpO2 98 % 08/14/2018  2:52 PM  Vitals shown include unvalidated device data.  Last Pain:  Vitals:   08/14/18 0700  TempSrc: Oral  PainSc: Asleep         Complications: No apparent anesthesia complications

## 2018-08-15 ENCOUNTER — Encounter (HOSPITAL_COMMUNITY): Payer: Self-pay | Admitting: Orthopedic Surgery

## 2018-08-15 ENCOUNTER — Inpatient Hospital Stay (HOSPITAL_COMMUNITY): Payer: PPO

## 2018-08-15 DIAGNOSIS — R000 Tachycardia, unspecified: Secondary | ICD-10-CM

## 2018-08-15 LAB — COMPREHENSIVE METABOLIC PANEL
ALT: 46 U/L — AB (ref 0–44)
AST: 64 U/L — ABNORMAL HIGH (ref 15–41)
Albumin: 2.7 g/dL — ABNORMAL LOW (ref 3.5–5.0)
Alkaline Phosphatase: 74 U/L (ref 38–126)
Anion gap: 13 (ref 5–15)
BUN: 6 mg/dL — ABNORMAL LOW (ref 8–23)
CO2: 24 mmol/L (ref 22–32)
Calcium: 8.4 mg/dL — ABNORMAL LOW (ref 8.9–10.3)
Chloride: 102 mmol/L (ref 98–111)
Creatinine, Ser: 0.49 mg/dL (ref 0.44–1.00)
GFR calc Af Amer: >60 mL/min (ref >60)
GFR calc non Af Amer: >60 mL/min (ref >60)
Glucose, Bld: 119 mg/dL — ABNORMAL HIGH (ref 70–99)
Potassium: 4.2 mmol/L (ref 3.5–5.1)
Sodium: 139 mmol/L (ref 135–145)
TOTAL PROTEIN: 5.2 g/dL — AB (ref 6.5–8.1)
Total Bilirubin: 1 mg/dL (ref 0.3–1.2)

## 2018-08-15 LAB — POCT I-STAT 7, (LYTES, BLD GAS, ICA,H+H)
Acid-Base Excess: 4 mmol/L — ABNORMAL HIGH (ref 0.0–2.0)
Bicarbonate: 27.1 mmol/L (ref 20.0–28.0)
CALCIUM ION: 1.15 mmol/L (ref 1.15–1.40)
HCT: 25 % — ABNORMAL LOW (ref 36.0–46.0)
Hemoglobin: 8.5 g/dL — ABNORMAL LOW (ref 12.0–15.0)
O2 SAT: 93 %
Patient temperature: 98
Potassium: 3.5 mmol/L (ref 3.5–5.1)
Sodium: 132 mmol/L — ABNORMAL LOW (ref 135–145)
TCO2: 28 mmol/L (ref 22–32)
pCO2 arterial: 35.2 mmHg (ref 32.0–48.0)
pH, Arterial: 7.494 — ABNORMAL HIGH (ref 7.350–7.450)
pO2, Arterial: 60 mmHg — ABNORMAL LOW (ref 83.0–108.0)

## 2018-08-15 LAB — CBC
HCT: 33.7 % — ABNORMAL LOW (ref 36.0–46.0)
Hemoglobin: 10.5 g/dL — ABNORMAL LOW (ref 12.0–15.0)
MCH: 34 pg (ref 26.0–34.0)
MCHC: 31.2 g/dL (ref 30.0–36.0)
MCV: 109.1 fL — AB (ref 80.0–100.0)
Platelets: 193 10*3/uL (ref 150–400)
RBC: 3.09 MIL/uL — AB (ref 3.87–5.11)
RDW: 14.4 % (ref 11.5–15.5)
WBC: 9.6 10*3/uL (ref 4.0–10.5)
nRBC: 0.2 % (ref 0.0–0.2)

## 2018-08-15 LAB — MAGNESIUM: Magnesium: 1.6 mg/dL — ABNORMAL LOW (ref 1.7–2.4)

## 2018-08-15 LAB — GLUCOSE, CAPILLARY: Glucose-Capillary: 82 mg/dL (ref 70–99)

## 2018-08-15 LAB — PHOSPHORUS: Phosphorus: 3 mg/dL (ref 2.5–4.6)

## 2018-08-15 MED ORDER — VITAMIN B-1 100 MG PO TABS
100.0000 mg | ORAL_TABLET | Freq: Every day | ORAL | Status: DC
  Administered 2018-08-16 – 2018-08-22 (×7): 100 mg via ORAL
  Filled 2018-08-15 (×10): qty 1

## 2018-08-15 MED ORDER — PROPRANOLOL HCL 20 MG PO TABS
20.0000 mg | ORAL_TABLET | Freq: Three times a day (TID) | ORAL | Status: DC
  Filled 2018-08-15 (×2): qty 1

## 2018-08-15 MED ORDER — BUSPIRONE HCL 5 MG PO TABS
10.0000 mg | ORAL_TABLET | Freq: Three times a day (TID) | ORAL | Status: DC
  Administered 2018-08-15 – 2018-08-26 (×30): 10 mg via ORAL
  Filled 2018-08-15 (×32): qty 2

## 2018-08-15 MED ORDER — FOLIC ACID 1 MG PO TABS
1.0000 mg | ORAL_TABLET | Freq: Every day | ORAL | Status: DC
  Administered 2018-08-16 – 2018-08-22 (×7): 1 mg via ORAL
  Filled 2018-08-15 (×12): qty 1

## 2018-08-15 MED ORDER — ACETAMINOPHEN 325 MG PO TABS
650.0000 mg | ORAL_TABLET | Freq: Four times a day (QID) | ORAL | Status: DC | PRN
  Administered 2018-08-19: 650 mg via ORAL
  Filled 2018-08-15: qty 2

## 2018-08-15 MED ORDER — HYDROMORPHONE HCL 1 MG/ML IJ SOLN: 0.5000 mg | INTRAMUSCULAR | Status: DC | PRN

## 2018-08-15 MED ORDER — IPRATROPIUM-ALBUTEROL 0.5-2.5 (3) MG/3ML IN SOLN
3.0000 mL | Freq: Three times a day (TID) | RESPIRATORY_TRACT | Status: DC
  Administered 2018-08-15 (×2): 3 mL via RESPIRATORY_TRACT
  Filled 2018-08-15 (×2): qty 3

## 2018-08-15 MED ORDER — MAGNESIUM SULFATE 2 GM/50ML IV SOLN
2.0000 g | Freq: Once | INTRAVENOUS | Status: AC
  Administered 2018-08-15: 2 g via INTRAVENOUS
  Filled 2018-08-15: qty 50

## 2018-08-15 MED ORDER — LIDOCAINE 5 % EX PTCH
1.0000 | MEDICATED_PATCH | CUTANEOUS | Status: DC
  Administered 2018-08-15 – 2018-08-25 (×11): 1 via TRANSDERMAL
  Filled 2018-08-15 (×12): qty 1

## 2018-08-15 MED ORDER — HYDROMORPHONE HCL 1 MG/ML IJ SOLN
INTRAMUSCULAR | Status: AC
  Administered 2018-08-15: 1 mg
  Filled 2018-08-15: qty 1

## 2018-08-15 MED ORDER — METOPROLOL TARTRATE 5 MG/5ML IV SOLN
10.0000 mg | Freq: Once | INTRAVENOUS | Status: AC
  Administered 2018-08-15: 10 mg via INTRAVENOUS
  Filled 2018-08-15: qty 10

## 2018-08-15 MED ORDER — OXYCODONE HCL 5 MG PO TABS
5.0000 mg | ORAL_TABLET | Freq: Four times a day (QID) | ORAL | Status: DC | PRN
  Administered 2018-08-16 – 2018-08-20 (×5): 10 mg via ORAL
  Administered 2018-08-21: 5 mg via ORAL
  Administered 2018-08-21: 10 mg via ORAL
  Administered 2018-08-21 – 2018-08-22 (×2): 5 mg via ORAL
  Administered 2018-08-22: 10 mg via ORAL
  Filled 2018-08-15 (×2): qty 1
  Filled 2018-08-15: qty 2
  Filled 2018-08-15: qty 1
  Filled 2018-08-15 (×5): qty 2
  Filled 2018-08-15: qty 1
  Filled 2018-08-15: qty 2

## 2018-08-15 MED ORDER — ACETAMINOPHEN 10 MG/ML IV SOLN
1000.0000 mg | Freq: Four times a day (QID) | INTRAVENOUS | Status: AC
  Administered 2018-08-15 – 2018-08-16 (×4): 1000 mg via INTRAVENOUS
  Filled 2018-08-15 (×4): qty 100

## 2018-08-15 MED ORDER — TRAMADOL HCL 50 MG PO TABS
50.0000 mg | ORAL_TABLET | Freq: Four times a day (QID) | ORAL | Status: DC | PRN
  Administered 2018-08-17 – 2018-08-26 (×10): 50 mg via ORAL
  Filled 2018-08-15 (×11): qty 1

## 2018-08-15 MED ORDER — SODIUM CHLORIDE 0.9 % IV SOLN
3.0000 g | Freq: Three times a day (TID) | INTRAVENOUS | Status: AC
  Administered 2018-08-15 – 2018-08-16 (×3): 3 g via INTRAVENOUS
  Filled 2018-08-15 (×3): qty 3

## 2018-08-15 MED ORDER — METOPROLOL TARTRATE 5 MG/5ML IV SOLN
10.0000 mg | Freq: Four times a day (QID) | INTRAVENOUS | Status: DC
  Administered 2018-08-16 – 2018-08-20 (×15): 10 mg via INTRAVENOUS
  Filled 2018-08-15 (×19): qty 10

## 2018-08-15 MED ORDER — HALOPERIDOL LACTATE 5 MG/ML IJ SOLN
5.0000 mg | Freq: Once | INTRAMUSCULAR | Status: DC
  Filled 2018-08-15: qty 1

## 2018-08-15 MED ORDER — KETOROLAC TROMETHAMINE 15 MG/ML IJ SOLN
15.0000 mg | Freq: Three times a day (TID) | INTRAMUSCULAR | Status: AC | PRN
  Administered 2018-08-15 – 2018-08-16 (×2): 15 mg via INTRAVENOUS
  Filled 2018-08-15 (×2): qty 1

## 2018-08-15 NOTE — Progress Notes (Signed)
Patient has a poor appetite, refuses rebreeze because she dislikes the taste.  Spoke with dietician.

## 2018-08-15 NOTE — Progress Notes (Signed)
OT Cancellation Note  Patient Details Name: [name] MRN: [id number] DOB: 10/17/1950   Cancelled Treatment:    Reason Eval/Treat Not Completed: Medical issues which prohibited therapy. Patient HR 135 supine in bed, RN aware.  Will follow and initiate OT eval when medically appropriate and able.  Delight Stare, OT Acute Rehabilitation Services Pager [phone] Office [place] 08/15/2018, 1:09 PM

## 2018-08-15 NOTE — Social Work (Signed)
CSW acknowledging consult, await post op therapy recommendations. Also aware of loss of husband and ETOH consumption.  CSW continuing to follow for support with disposition when medically appropriate.  Westley Hummer, MSW, [place] Work [phone]

## 2018-08-15 NOTE — Progress Notes (Signed)
Patient has been tachy for the shift. C/o left shoulder pain related to fall and surgery. New orders placed for CIWA and tachycardia.

## 2018-08-15 NOTE — Care Management Note (Signed)
Case Management Note  Patient Details  Name: [name] MRN: [id number] Date of Birth: 1951/04/07  Subjective/Objective:  Pt admitted on 08/13/2018 after being found down at home surrounded by wine bottles.  Pt found to have hypotension, proximal Lt humerus fx, macrocytic anemia, elevated LFTs, and ETOH withdrawal.  PTA, pt independent, lives alone. Per report, pt recently passed.                 Action/Plan: Awaiting PT/OT evaluations to determine LOC needed at dc.  Will likely need SNF; CSW is following.    Expected Discharge Date:                  Expected Discharge Plan:  Skilled Nursing Facility  In-House Referral:  Clinical Social Work  Discharge planning Services  CM Consult  Post Acute Care Choice:    Choice offered to:     DME Arranged:    DME Agency:     HH Arranged:    [place] Agency:     Status of Service:  In process, will continue to follow  If discussed at Long Length of Stay Meetings, dates discussed:    Additional Comments:  Reinaldo Raddle, RN, BSN  Trauma/Neuro ICU Case Manager [phone]

## 2018-08-15 NOTE — Consult Note (Signed)
NAME:  [name], MRN:  [id number], DOB:  1951/01/24, LOS: 2 ADMISSION DATE:  08/12/2018, CONSULTATION DATE:  08/15/2018 REFERRING MD:   Thereasa Solo, TRH, CHIEF COMPLAINT:  tachypnea   HPI/course in hospital  Asked to see regarding increasing tachypnea and tachycardia today.  Admitted following fall at home due to alcohol intoxication. Found down at home in urine and feces unable to get up following prolonged alcohol binge. Dysfunctional grief following death of her husband.  Underwent left shoulder arthroplasty yesterday.   Today more confused and lethargic, tachycardia and tachypnea despite CIWA ativan.  Past Medical History   Past Medical History:  Diagnosis Date  . Anemia   . Cancer [facility]) 2007   lung cancer, right, chemo done  . Complication of anesthesia 2007   slow to awaken  . History of blood transfusion 2010  . Hypertension   . Occasional tremors     Past Surgical History:  Procedure Laterality Date  . COLONOSCOPY WITH PROPOFOL N/A 11/28/2012   Procedure: COLONOSCOPY WITH PROPOFOL;  Surgeon: Garlan Fair, MD;  Location: [facility];  Service: Endoscopy;  Laterality: N/A;  . REVERSE SHOULDER ARTHROPLASTY Left 08/14/2018   Procedure: REVERSE SHOULDER ARTHROPLASTY;  Surgeon: Netta Cedars, MD;  Location: [facility];  Service: Orthopedics;  Laterality: Left;  . right lung lobectomy  2007     Review of Systems:   Review of Systems  Unable to perform ROS: Mental acuity    Social History   reports that she has been smoking cigarettes. She has a 75.00 pack-year smoking history. She has never used smokeless tobacco. She reports current alcohol use of about 7.0 standard drinks of alcohol per week. She reports that she does not use drugs.   Family History   Her family history includes Cancer in her mother; Diabetes in her father; Heart disease in her father.   Allergies Allergies  Allergen Reactions  . Tape Other (See Comments)    Patient PREFERS paper tape     Home  Medications  Prior to Admission medications   Medication Sig Start Date End Date Taking? Authorizing Provider  atorvastatin (LIPITOR) 10 MG tablet Take 10 mg by mouth daily.   Yes [provider]  busPIRone (BUSPAR) 10 MG tablet Take 10 mg by mouth 3 (three) times daily.    Yes [provider]  escitalopram (LEXAPRO) 10 MG tablet Take 10 mg by mouth daily.   Yes [provider]  hydrocortisone-pramoxine (ANALPRAM-HC) 2.5-1 % rectal cream PLACE RECTALLY 4 (FOUR) TIMES DAILY. FOR IRRITATED AND PAINFUL HEMORRHOIDS Patient taking differently: Place 1 application rectally 4 (four) times daily as needed (for irritated and painful hemorrhoids).    Liston Alba, MD  Multiple Vitamin (MULTIVITAMIN) tablet Take 1 tablet by mouth daily.   Yes [provider]  PROAIR HFA 108 (90 Base) MCG/ACT inhaler Inhale 2 puffs into the lungs every 4 (four) hours as needed. 04/20/18  Yes [provider]     Interim history/subjective:  Patient is currently unable to communicate what is bothering her.  Objective   Blood pressure (!) 148/67, pulse (!) 129, temperature 98 F (36.7 C), temperature source Oral, resp. rate (!) 22, height 5' 4.5" (1.638 m), weight 66.8 kg, SpO2 98 %.        Intake/Output Summary (Last 24 hours) at 08/15/2018 1644 Last data filed at 08/15/2018 1635 Gross per 24 hour  Intake 376.03 ml  Output 450 ml  Net -73.97 ml   [employer]  08/13/18 0031 08/13/18 0500 08/15/18 0412  Weight: 52.6 kg 65.1 kg 66.8 kg    Examination: Physical Exam  Constitutional: She appears lethargic.  Appears ill-kempt and malnourished.  HENT:  Head: Normocephalic and atraumatic.  Eyes: Conjunctivae are normal.  Neck: No JVD present.  ++ upper airway secretions with poor cough.  Cardiovascular: Regular rhythm and normal heart sounds. Tachycardia present.  Dry mucous membranes.  Respiratory: Effort normal and breath sounds normal. No accessory muscle  usage or stridor. Tachypnea noted.  Left chest tender to palpation with limited chest excursion and  GI: Soft. There is abdominal tenderness.  Musculoskeletal:     Comments: Surgical dressing left shoulder.  Neurological: She has normal strength. She appears lethargic. No cranial nerve deficit or sensory deficit. GCS eye subscore is 4. GCS verbal subscore is 4. GCS motor subscore is 6.  Skin: Ecchymosis noted.     Ancillary tests (personally reviewed)  CBC: Recent Labs  Lab 08/12/18 1857 08/13/18 0221 08/13/18 1943 08/14/18 0204 08/15/18 0500  WBC 12.8* 7.4 8.2 7.6 9.6  NEUTROABS 10.9* 5.8  --   --   --   HGB 11.0* 7.5* 9.8* 9.4* 10.5*  HCT 34.0* 24.1* 30.8* 30.2* 33.7*  MCV 110.4* 114.2* 108.5* 110.2* 109.1*  PLT 238 167 177 159 962    Basic Metabolic Panel: Recent Labs  Lab 08/12/18 1857 08/13/18 0221 08/13/18 1943 08/14/18 0204 08/15/18 0500  NA 140 140 138 139 139  K 3.2* 2.9* 4.3 4.3 4.2  CL 100 113* 109 110 102  CO2 24 21* 23 20* 24  GLUCOSE 128* 88 72 69* 119*  BUN 15 11 7* 7* 6*  CREATININE 0.51 0.35* 0.37* 0.31* 0.49  CALCIUM 8.7* 6.4* 7.7* 7.8* 8.4*  MG  --  1.0* 2.1 1.7 1.6*  PHOS  --   --   --  2.3* 3.0   GFR: Estimated Creatinine Clearance: 58.6 mL/min (by C-G formula based on SCr of 0.49 mg/dL). Recent Labs  Lab 08/12/18 2315 08/13/18 0221 08/13/18 1943 08/14/18 0204 08/15/18 0500  WBC  --  7.4 8.2 7.6 9.6  LATICACIDVEN 0.9  --   --   --   --     Liver Function Tests: Recent Labs  Lab 08/12/18 1857 08/13/18 0221 08/14/18 0204 08/15/18 0500  AST 110* 62* 60* 64*  ALT 76* 45* 45* 46*  ALKPHOS 105 63 71 74  BILITOT 1.4* 0.9 0.9 1.0  PROT 6.0* 3.7* 4.6* 5.2*  ALBUMIN 3.1* 1.9* 2.5* 2.7*   Recent Labs  Lab 08/12/18 1857  LIPASE 20   No results for input(s): AMMONIA in the last 168 hours.  ABG No results found for: PHART, PCO2ART, PO2ART, HCO3, TCO2, ACIDBASEDEF, O2SAT   Coagulation Profile: Recent Labs  Lab 08/12/18 1857    INR 0.95    Cardiac Enzymes: Recent Labs  Lab 08/12/18 1857  CKTOTAL 232    HbA1C: No results found for: HGBA1C  CBG: Recent Labs  Lab 08/13/18 2102 08/13/18 2134 08/14/18 1000 08/15/18 0802  GLUCAP 65* 121* 84 82     Assessment & Plan:  Tachycardia and tachypnea driven by perioperative pain with associated inability to take deep breaths Alcohol withdrawal appears well controlled at this time as patient is not tremulous. Clinically volume contracted.   PLAN:  Multimodal pain control: APAP IV, NSAID IV and lidocaine topical  Judicious narcotic use. Encourage incentive spirometry once more awake and begin ambulation. Continue CIWA but would respond only to tremulousness with Ativan. Remains at  high risk for intubation. ABG to ensure no hypercapnia to explain lethargy. Continue fluid resuscitation. Multivitamin supplementation At risk for refeeding and possible hypophosphatemia and muscle weakness.    Kipp Brood, MD [facility] ICU Physician [place]  Pager: [phone] Mobile: [phone] After hours: [phone].  08/15/2018, 4:44 PM

## 2018-08-15 NOTE — Progress Notes (Signed)
Orthopedics Progress Note  Subjective: Complains of pain to the left shoulder which is normal at this stage of recovery.   Objective:  Vitals:   08/15/18 0802 08/15/18 0847  BP: 119/68   Pulse: (!) 114   Resp: (!) 22   Temp: 98 F (36.7 C)   SpO2: 100% 98%    General: Awake and alert  Musculoskeletal: Left shoulder dressing intact with no active seepage or spotting. Moderate generalized swelling in the left arm Neurovascularly intact  Lab Results  Component Value Date   WBC 9.6 08/15/2018   HGB 10.5 (L) 08/15/2018   HCT 33.7 (L) 08/15/2018   MCV 109.1 (H) 08/15/2018   PLT 193 08/15/2018       Component Value Date/Time   NA 139 08/15/2018 0500   NA 144 03/26/2009 0824   K 4.2 08/15/2018 0500   K 4.7 03/26/2009 0824   CL 102 08/15/2018 0500   CL 106 03/26/2009 0824   CO2 24 08/15/2018 0500   CO2 31 03/26/2009 0824   GLUCOSE 119 (H) 08/15/2018 0500   GLUCOSE 96 03/26/2009 0824   BUN 6 (L) 08/15/2018 0500   BUN 9 03/26/2009 0824   CREATININE 0.49 08/15/2018 0500   CREATININE 0.5 (L) 03/26/2009 0824   CALCIUM 8.4 (L) 08/15/2018 0500   CALCIUM 9.4 03/26/2009 0824   GFRNONAA >60 08/15/2018 0500   GFRAA >60 08/15/2018 0500    Lab Results  Component Value Date   INR 0.95 08/12/2018    Assessment/Plan: POD #1 s/p Procedure(s): REVERSE SHOULDER ARTHROPLASTY Continue Therapy and medical support.  I concerned for her ability to support herself when she is discharged.  She will have her arm in a sling and is a very high fall risk.  I am also concerned about her severe alcohol abuse since her husband passed away and suspect that will continue post discharge.  Would recommend consideration for CIR admission for ongoing rehab and medical support. Thanks! Post op XRAYs look great  Doran Heater. Veverly Fells, MD 08/15/2018 1:47 PM

## 2018-08-15 NOTE — Progress Notes (Addendum)
[place] TEAM 1 - Stepdown/ICU TEAM  [name]  YBO:[id number] DOB: 26-Jul-1950 DOA: 08/12/2018 PCP: Wenda Low, MD    Brief Narrative:  68yo female w/ a hx of hypertension, anxiety, and cancer of the right lung status post RLL lobectomy and chemotherapy in 2007 who was brought to the ED 2/2 after she was found down on the floor surrounded by multiple wine bottles and soiled in feces and urine, reporting that she had fallen on 08/10/2018 and was unable to get up. Patient reported she had been drinking wine daily since her husband died, but was unable to quantify further.   In the ED CT head was negative for acute intracranial abnormality and cervical spine CT was negative for acute pathology. Radiographs of the left shoulder reveal angulated humeral neck fracture with possible extension into the humeral head. CT of chest/abdomen/pelvis was negative for acute finding other than the humerus fracture.   Significant Events: 2/2 admit after fall at home w/ prolonged down time  2/3 OR - reverse left shoulder arthroplasty with tuberosity repair  Subjective: Pt is awake and oriented, but very tremulous. She tells me she has a benign essential tremor, and that it is always worse when she is somewhere unusual or under stress. She denies cp, n/v, or abdom pain. She reports ongoing pain in her shoulder. She is anxious to be d/c home asap.   Assessment & Plan:  Hypotension - severe dehydration Corrected with volume resuscitation and albumin infusion  Alcohol dependence with early alcohol withdrawal Appears to be in mild to modest withdrawal - continue CIWA protocol  Severe benign essential tremor Likely exacerbated by anxiety and EtOH withdrawal - add scheduled propranolol   L basilar PNA - suspected aspiration  Cont IV unasyn for now   Severe Hypokalemia Corrected   Severe Hypomagnesemia  anticipate significant total body deficit related to malnutrition of alcoholism - supplement  further today   Transaminitis Likely due to a combination of chronic heavy alcohol intake as well as mild shock liver - steadily improving  Proximal left humerus fracture Taken to the OR per Ortho -ongoing care per Orthopedics  Macrocytic anemia Now s/p 1U PRBC - hemoglobin now stable - B12 and folate normal   COPD Wheezing now resolved - follow   DVT prophylaxis: SCDs Code Status: FULL CODE Family Communication: No family present Disposition Plan: SDU  Consultants:  Orthopedics   Antimicrobials:  none  Objective: Blood pressure 119/68, pulse (!) 114, temperature 98 F (36.7 C), temperature source Oral, resp. rate (!) 22, height 5' 4.5" (1.638 m), weight 66.8 kg, SpO2 98 %.  Intake/Output Summary (Last 24 hours) at 08/15/2018 1135 Last data filed at 08/15/2018 0407 Gross per 24 hour  Intake 1214.39 ml  Output 700 ml  Net 514.39 ml   [employer]   08/13/18 0031 08/13/18 0500 08/15/18 0412  Weight: 52.6 kg 65.1 kg 66.8 kg    Examination: General: No acute respiratory distress - very tremulous  Lungs: L basilar crackles - no wheezing   Cardiovascular: regular but tachycardic up to 120bpm - no M or rub  Abdomen: NT/ND, soft, bs+, no mass  Extremities: No signif dema bilateral lower extremities  CBC: Recent Labs  Lab 08/12/18 1857 08/13/18 0221 08/13/18 1943 08/14/18 0204 08/15/18 0500  WBC 12.8* 7.4 8.2 7.6 9.6  NEUTROABS 10.9* 5.8  --   --   --   HGB 11.0* 7.5* 9.8* 9.4* 10.5*  HCT 34.0* 24.1* 30.8* 30.2* 33.7*  MCV 110.4*  114.2* 108.5* 110.2* 109.1*  PLT 238 167 177 159 119   Basic Metabolic Panel: Recent Labs  Lab 08/12/18 1857 08/13/18 0221 08/13/18 1943 08/14/18 0204 08/15/18 0500  NA 140 140 138 139 139  K 3.2* 2.9* 4.3 4.3 4.2  CL 100 113* 109 110 102  CO2 24 21* 23 20* 24  GLUCOSE 128* 88 72 69* 119*  BUN 15 11 7* 7* 6*  CREATININE 0.51 0.35* 0.37* 0.31* 0.49  CALCIUM 8.7* 6.4* 7.7* 7.8* 8.4*  MG  --  1.0* 2.1 1.7 1.6*  PHOS  --   --    --  2.3* 3.0   GFR: Estimated Creatinine Clearance: 58.6 mL/min (by C-G formula based on SCr of 0.49 mg/dL).  Liver Function Tests: Recent Labs  Lab 08/12/18 1857 08/13/18 0221 08/14/18 0204 08/15/18 0500  AST 110* 62* 60* 64*  ALT 76* 45* 45* 46*  ALKPHOS 105 63 71 74  BILITOT 1.4* 0.9 0.9 1.0  PROT 6.0* 3.7* 4.6* 5.2*  ALBUMIN 3.1* 1.9* 2.5* 2.7*   Recent Labs  Lab 08/12/18 1857  LIPASE 20    Coagulation Profile: Recent Labs  Lab 08/12/18 1857  INR 0.95    Cardiac Enzymes: Recent Labs  Lab 08/12/18 1857  CKTOTAL 232    Recent Results (from the past 240 hour(s))  Blood Culture (routine x 2)     Status: None (Preliminary result)   Collection Time: 08/12/18 11:40 PM  Result Value Ref Range Status   Specimen Description BLOOD RIGHT ANTECUBITAL  Final   Special Requests   Final    BOTTLES DRAWN AEROBIC AND ANAEROBIC Blood Culture results may not be optimal due to an excessive volume of blood received in culture bottles   Culture   Final    NO GROWTH 2 DAYS Performed at [facility], [place] [address], [place], [place] [address]    Report Status PENDING  Incomplete  Blood Culture (routine x 2)     Status: None (Preliminary result)   Collection Time: 08/12/18 11:56 PM  Result Value Ref Range Status   Specimen Description BLOOD RIGHT ARM  Final   Special Requests   Final    BOTTLES DRAWN AEROBIC ONLY Blood Culture results may not be optimal due to an excessive volume of blood received in culture bottles   Culture   Final    NO GROWTH 2 DAYS Performed at [facility], [place] [address] [place], [place] [address]    Report Status PENDING  Incomplete  MRSA PCR Screening     Status: None   Collection Time: 08/13/18  5:38 AM  Result Value Ref Range Status   MRSA by PCR NEGATIVE NEGATIVE Final    Comment:        The GeneXpert MRSA Assay (FDA approved for NASAL specimens only), is one component of a comprehensive MRSA colonization surveillance program.  It is not intended to diagnose MRSA infection nor to guide or monitor treatment for MRSA infections. Performed at [facility], [place] [address] [place], [place] [address]      Scheduled Meds: . atorvastatin  10 mg Oral q1800  . docusate sodium  100 mg Oral BID  . escitalopram  10 mg Oral Daily  . feeding supplement  1 Container Oral TID BM  . folic acid  1 mg Oral Daily  . ipratropium-albuterol  3 mL Nebulization TID  . mouth rinse  15 mL Mouth Rinse BID  . metoprolol tartrate  2.5  mg Intravenous Q8H  . sodium chloride flush  3 mL Intravenous Q12H  . [START ON 08/16/2018] thiamine  100 mg Oral Daily     LOS: 2 days    Cherene Altes, MD [facility] Office  [phone] Pager - Text Page per Shea Evans as per below:  On-Call/Text Page:      [email or web address]  If 7PM-7AM, please contact night-coverage [email or web address] 08/15/2018, 11:35 AM

## 2018-08-15 NOTE — Progress Notes (Signed)
RT NOTE: RT obtained ABG. Very small amount of arterial blood so RT ran ABG on ISTAT. ABG results are as follows on 2L [place]: pH 7.49 pCO2 35.2 pO2 60 HCO3 27.1 Sat 93%. RN aware. RT will continue to monitor as needed.

## 2018-08-16 ENCOUNTER — Ambulatory Visit (HOSPITAL_COMMUNITY): Payer: PPO

## 2018-08-16 DIAGNOSIS — I82409 Acute embolism and thrombosis of unspecified deep veins of unspecified lower extremity: Secondary | ICD-10-CM

## 2018-08-16 LAB — COMPREHENSIVE METABOLIC PANEL
ALT: 44 U/L (ref 0–44)
AST: 83 U/L — ABNORMAL HIGH (ref 15–41)
Albumin: 2.3 g/dL — ABNORMAL LOW (ref 3.5–5.0)
Alkaline Phosphatase: 68 U/L (ref 38–126)
Anion gap: 4 — ABNORMAL LOW (ref 5–15)
BUN: 6 mg/dL — AB (ref 8–23)
CO2: 31 mmol/L (ref 22–32)
Calcium: 8.2 mg/dL — ABNORMAL LOW (ref 8.9–10.3)
Chloride: 105 mmol/L (ref 98–111)
Creatinine, Ser: 0.37 mg/dL — ABNORMAL LOW (ref 0.44–1.00)
GFR calc Af Amer: >60 mL/min (ref >60)
Glucose, Bld: 90 mg/dL (ref 70–99)
Potassium: 3.9 mmol/L (ref 3.5–5.1)
Sodium: 140 mmol/L (ref 135–145)
Total Bilirubin: 1 mg/dL (ref 0.3–1.2)
Total Protein: 4.6 g/dL — ABNORMAL LOW (ref 6.5–8.1)

## 2018-08-16 LAB — CBC
HCT: 30.2 % — ABNORMAL LOW (ref 36.0–46.0)
Hemoglobin: 9.2 g/dL — ABNORMAL LOW (ref 12.0–15.0)
MCH: 34.1 pg — ABNORMAL HIGH (ref 26.0–34.0)
MCHC: 30.5 g/dL (ref 30.0–36.0)
MCV: 111.9 fL — ABNORMAL HIGH (ref 80.0–100.0)
Platelets: 180 10*3/uL (ref 150–400)
RBC: 2.7 MIL/uL — ABNORMAL LOW (ref 3.87–5.11)
RDW: 14.1 % (ref 11.5–15.5)
WBC: 6 10*3/uL (ref 4.0–10.5)
nRBC: 0 % (ref 0.0–0.2)

## 2018-08-16 LAB — MAGNESIUM: Magnesium: 1.6 mg/dL — ABNORMAL LOW (ref 1.7–2.4)

## 2018-08-16 LAB — GLUCOSE, CAPILLARY
Glucose-Capillary: 64 mg/dL — ABNORMAL LOW (ref 70–99)
Glucose-Capillary: 118 mg/dL — ABNORMAL HIGH (ref 70–99)

## 2018-08-16 MED ORDER — ENSURE ENLIVE PO LIQD
237.0000 mL | Freq: Three times a day (TID) | ORAL | Status: DC
  Administered 2018-08-16 – 2018-08-23 (×10): 237 mL via ORAL

## 2018-08-16 MED ORDER — MAGNESIUM SULFATE 2 GM/50ML IV SOLN
2.0000 g | Freq: Once | INTRAVENOUS | Status: AC
  Administered 2018-08-16: 2 g via INTRAVENOUS
  Filled 2018-08-16: qty 50

## 2018-08-16 MED ORDER — DEXTROSE 50 % IV SOLN
INTRAVENOUS | Status: AC
  Administered 2018-08-16: 25 mL
  Filled 2018-08-16: qty 50

## 2018-08-16 NOTE — NC FL2 (Signed)
[place] LEVEL OF CARE SCREENING TOOL     IDENTIFICATION  Patient Name: [name] Birthdate: 14-Sep-1950 Sex: female Admission Date (Current Location): 08/12/2018  [facility] and [place] Number:  [employer] and Address:  [facility] [place]. [facility], [place] [address] [place], [place] [address]      Provider Number: [id number]  Attending Physician Name and Address:  Cristy Folks, MD  Relative Name and Phone Number:  Jonessa Triplett, daughter, [phone]    Current Level of Care: Hospital Recommended Level of Care: [place] Prior Approval Number:    Date Approved/Denied:   PASRR Number: [id number]  Discharge Plan: SNF    Current Diagnoses: Patient Active Problem List   Diagnosis Date Noted  . Malnutrition of moderate degree 08/14/2018  . 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 Alcohol dependence with withdrawal ([place]) 08/13/2018  . Elevated LFTs 08/13/2018  . Hypokalemia 08/13/2018  . Macrocytic anemia 08/13/2018  . Hypotension due to hypovolemia 08/13/2018  . Closed fracture of left humerus 08/13/2018  . Tachycardia   . Closed left humeral fracture 08/12/2018  . Solitary pulmonary nodule on lung CT 06/24/2018  . COPD pfts pending/ active smoker 06/24/2018  . Cigarette smoker 06/24/2018  . Tongue ulceration 06/14/2018  . Personal history of colonic polyps 01/29/2013  . Internal prolapsed hemorrhoids 01/29/2013    Orientation RESPIRATION BLADDER Height & Weight     Self, Time, Situation, Place  O2(nasal canula 2 L) Continent, External catheter Weight: 147 lb 14.9 oz (67.1 kg) Height:  5' 4.5" (163.8 cm)  BEHAVIORAL SYMPTOMS/MOOD NEUROLOGICAL BOWEL NUTRITION STATUS      Continent Diet(see discharge summary)  AMBULATORY STATUS COMMUNICATION OF NEEDS Skin   Extensive Assist Verbally Surgical wounds(incision on left arm with adhesive bandage)                       Personal Care Assistance Level of  Assistance  Bathing, Feeding, Dressing Bathing Assistance: Maximum assistance Feeding assistance: Limited assistance Dressing Assistance: Maximum assistance     Functional Limitations Info  Sight, Hearing, Speech Sight Info: Impaired Hearing Info: Adequate Speech Info: Adequate    SPECIAL CARE FACTORS FREQUENCY  PT (By licensed PT), OT (By licensed OT)     PT Frequency: 5x week OT Frequency: 5x week            Contractures Contractures Info: Not present    Additional Factors Info  Code Status, Allergies, Psychotropic Code Status Info: Full Code Allergies Info: TAPE  Psychotropic Info: busPIRone (BUSPAR) tablet 10 mg 3x daily PO; escitalopram (LEXAPRO) tablet 10 mg daily PO         Current Medications (08/16/2018):  This is the current hospital active medication list Current Facility-Administered Medications  Medication Dose Route Frequency Provider Last Rate Last Dose  . 0.9 % NaCl with KCl 20 mEq/ L  infusion   Intravenous Continuous Cherene Altes, MD 100 mL/hr at 08/16/18 1138    . acetaminophen (TYLENOL) tablet 650 mg  650 mg Oral Q6H PRN Joette Catching T, MD      . albuterol (PROVENTIL) (2.5 MG/3ML) 0.083% nebulizer solution 2.5 mg  2.5 mg Nebulization Q2H PRN Netta Cedars, MD   2.5 mg at 08/16/18 1210  . atorvastatin (LIPITOR) tablet 10 mg  10 mg Oral q1800 Netta Cedars, MD   10 mg at 08/15/18 2058  . busPIRone (BUSPAR) tablet 10 mg  10 mg Oral TID Cherene Altes,  MD   10 mg at 08/16/18 1143  . diphenhydrAMINE (BENADRYL) injection 25 mg  25 mg Intravenous Q6H PRN Netta Cedars, MD   25 mg at 08/14/18 1005  . docusate sodium (COLACE) capsule 100 mg  100 mg Oral BID Netta Cedars, MD   100 mg at 08/16/18 1144  . escitalopram (LEXAPRO) tablet 10 mg  10 mg Oral Daily Netta Cedars, MD   10 mg at 08/16/18 1143  . feeding supplement (ENSURE ENLIVE) (ENSURE ENLIVE) liquid 237 mL  237 mL Oral TID BM Purohit, Shrey C, MD   237 mL at 08/16/18 1438  . folic acid  (FOLVITE) tablet 1 mg  1 mg Oral Daily Cherene Altes, MD   1 mg at 08/16/18 1144  . HYDROmorphone (DILAUDID) injection 0.5-1 mg  0.5-1 mg Intravenous Q3H PRN Cherene Altes, MD      . ketorolac (TORADOL) 15 MG/ML injection 15 mg  15 mg Intravenous Q8H PRN Agarwala, Einar Grad, MD   15 mg at 08/16/18 1438  . lidocaine (LIDODERM) 5 % 1 patch  1 patch Transdermal Q24H Kipp Brood, MD   1 patch at 08/15/18 1842  . LORazepam (ATIVAN) injection 2-3 mg  2-3 mg Intravenous Q1H PRN Netta Cedars, MD   3 mg at 08/15/18 1406  . MEDLINE mouth rinse  15 mL Mouth Rinse BID Netta Cedars, MD   15 mL at 08/16/18 0845  . menthol-cetylpyridinium (CEPACOL) lozenge 3 mg  1 lozenge Oral PRN Netta Cedars, MD       Or  . phenol (CHLORASEPTIC) mouth spray 1 spray  1 spray Mouth/Throat PRN Netta Cedars, MD      . metoCLOPramide (REGLAN) tablet 5-10 mg  5-10 mg Oral Q8H PRN Netta Cedars, MD       Or  . metoCLOPramide (REGLAN) injection 5-10 mg  5-10 mg Intravenous Q8H PRN Netta Cedars, MD   10 mg at 08/15/18 1208  . metoprolol tartrate (LOPRESSOR) injection 10 mg  10 mg Intravenous Q6H Cherene Altes, MD   10 mg at 08/16/18 0537  . ondansetron (ZOFRAN) tablet 4 mg  4 mg Oral Q6H PRN Netta Cedars, MD       Or  . ondansetron [facility]) injection 4 mg  4 mg Intravenous Q6H PRN Netta Cedars, MD   4 mg at 08/16/18 1438  . oxyCODONE (Oxy IR/ROXICODONE) immediate release tablet 5-10 mg  5-10 mg Oral Q6H PRN Cherene Altes, MD   10 mg at 08/16/18 0311  . sodium chloride flush (NS) 0.9 % injection 3 mL  3 mL Intravenous Q12H Netta Cedars, MD   3 mL at 08/16/18 1147  . thiamine (VITAMIN B-1) tablet 100 mg  100 mg Oral Daily Cherene Altes, MD   100 mg at 08/16/18 1148  . traMADol (ULTRAM) tablet 50 mg  50 mg Oral Q6H PRN Cherene Altes, MD         Discharge Medications: Please see discharge summary for a list of discharge medications.  Relevant Imaging Results:  Relevant Lab  Results:   Additional Information [id number] [place], [place]

## 2018-08-16 NOTE — Progress Notes (Signed)
Please review labs from 0500 on today

## 2018-08-16 NOTE — Progress Notes (Signed)
PROGRESS NOTE    [name]  EXB:[id number] DOB: 08-Jun-1951 DOA: 08/12/2018 PCP: Wenda Low, MD    Brief Narrative:  68 year old with past medical history relevant for depression/anxiety, COPD, hyperlipidemia, alcohol abuse admitted with falls and found to have left humeral fracture and significant alcohol withdrawal as well as electrolyte abnormalities.   Assessment & Plan:   Principal Problem:   Alcohol dependence with withdrawal (HCC) Active Problems:   COPD pfts pending/ active smoker   Closed left humeral fracture   Elevated LFTs   Hypokalemia   Macrocytic anemia   Hypotension due to hypovolemia   Closed fracture of left humerus   Malnutrition of moderate degree   H/O total shoulder replacement, left   #) Acute alcohol withdrawal: This appears to be improving somewhat.  Patient denies any significant alcohol use which is likely understanding the amount of alcohol she drank. -Continue CIWA protocol -Continue thiamine and folate supplementation  #) Left humeral fracture status post shoulder arthroplasty: -PT/OT consult - [facility] inpatient rehab recommended, will place consult  #) Electrolyte abnormalities: This is improved after treatment -Telemetry  #) Elevated LFTs: Likely secondary alcohol abuse, improving  #) Pain/psych: -Continue escitalopram 10 mg daily -Continue buspirone 10 mg 3 times daily  #) Hyperlipidemia: -Continue atorvastatin 10 mg daily  Fluids: Tolerating p.o. Electrolytes: Monitor and supplement Nutrition: Regular   prophylaxis: Enoxaparin  Disposition: Pending resolution of withdrawal in possible inpatient rehab  Full code  Consultants:   Orthopedic surgery, Dr. Alma Friendly  PCCM  Procedures:   2/3/2020Left reverse total shoulder arthroplasty using [employer] Comprehensive System with tuberosity repair.  Antimicrobials:   None   Subjective: This morning the patient reports she is in pain.  She does not have much complaints.  She  continues to report diffuse tremulousness.  She denies any nausea, vomiting, diarrhea, cough, congestion, rhinorrhea.  She is quite hostile with this Probation officer about  Objective: Vitals:   08/16/18 0000 08/16/18 0300 08/16/18 0408 08/16/18 0800  BP: 132/73   (!) 97/58  Pulse: 100 93  84  Resp: (!) 31 (!) 26  (!) 25  Temp: 98.3 F (36.8 C) 97.6 F (36.4 C)  98.1 F (36.7 C)  TempSrc: Oral Axillary  Oral  SpO2: 100% 93%  95%  Weight:   67.1 kg   Height:        Intake/Output Summary (Last 24 hours) at 08/16/2018 1123 Last data filed at 08/16/2018 0403 Gross per 24 hour  Intake 1753.84 ml  Output 150 ml  Net 1603.84 ml   Filed Weights   08/13/18 0500 08/15/18 0412 08/16/18 0408  Weight: 65.1 kg 66.8 kg 67.1 kg    Examination:  General exam: Appears calm and comfortable  Respiratory system: Clear to auscultation.  Diminished lung sounds at bases, no wheezes, crackles, rhonchi Cardiovascular system: Regular rate and rhythm, no murmurs Gastrointestinal system: Soft, nondistended, no rebound or guarding, plus bowel sounds Central nervous system: Alert and oriented.  Grossly intact, moving all extremities, right upper extremity limited by pain Extremities: No lower extremity edema. Skin: Numerous contusions over bilateral arms, right shoulder surgical site is clean dry and intact Psychiatry: Judgement and insight appear poor. Mood & affect flat    Data Reviewed: I have personally reviewed following labs and imaging studies  CBC: Recent Labs  Lab 08/12/18 1857 08/13/18 0221 08/13/18 1943 08/14/18 0204 08/15/18 0500 08/15/18 1736 08/16/18 0520  WBC 12.8* 7.4 8.2 7.6 9.6  --  6.0  NEUTROABS 10.9* 5.8  --   --   --   --   --  HGB 11.0* 7.5* 9.8* 9.4* 10.5* 8.5* 9.2*  HCT 34.0* 24.1* 30.8* 30.2* 33.7* 25.0* 30.2*  MCV 110.4* 114.2* 108.5* 110.2* 109.1*  --  111.9*  PLT 238 167 177 159 193  --  220   Basic Metabolic Panel: Recent Labs  Lab 08/13/18 0221 08/13/18 1943  08/14/18 0204 08/15/18 0500 08/15/18 1736 08/16/18 0520  NA 140 138 139 139 132* 140  K 2.9* 4.3 4.3 4.2 3.5 3.9  CL 113* 109 110 102  --  105  CO2 21* 23 20* 24  --  31  GLUCOSE 88 72 69* 119*  --  90  BUN 11 7* 7* 6*  --  6*  CREATININE 0.35* 0.37* 0.31* 0.49  --  0.37*  CALCIUM 6.4* 7.7* 7.8* 8.4*  --  8.2*  MG 1.0* 2.1 1.7 1.6*  --  1.6*  PHOS  --   --  2.3* 3.0  --   --    GFR: Estimated Creatinine Clearance: 63.3 mL/min (A) (by C-G formula based on SCr of 0.37 mg/dL (L)). Liver Function Tests: Recent Labs  Lab 08/12/18 1857 08/13/18 0221 08/14/18 0204 08/15/18 0500 08/16/18 0520  AST 110* 62* 60* 64* 83*  ALT 76* 45* 45* 46* 44  ALKPHOS 105 63 71 74 68  BILITOT 1.4* 0.9 0.9 1.0 1.0  PROT 6.0* 3.7* 4.6* 5.2* 4.6*  ALBUMIN 3.1* 1.9* 2.5* 2.7* 2.3*   Recent Labs  Lab 08/12/18 1857  LIPASE 20   No results for input(s): AMMONIA in the last 168 hours. Coagulation Profile: Recent Labs  Lab 08/12/18 1857  INR 0.95   Cardiac Enzymes: Recent Labs  Lab 08/12/18 1857  CKTOTAL 232   BNP (last 3 results) No results for input(s): PROBNP in the last 8760 hours. HbA1C: No results for input(s): HGBA1C in the last 72 hours. CBG: Recent Labs  Lab 08/13/18 2102 08/13/18 2134 08/14/18 1000 08/15/18 0802 08/16/18 0826  GLUCAP 65* 121* 84 82 64*   Lipid Profile: No results for input(s): CHOL, HDL, LDLCALC, TRIG, CHOLHDL, LDLDIRECT in the last 72 hours. Thyroid Function Tests: No results for input(s): TSH, T4TOTAL, FREET4, T3FREE, THYROIDAB in the last 72 hours. Anemia Panel: Recent Labs    08/14/18 0204  VITAMINB12 504  FOLATE 10.6  FERRITIN 427*  TIBC 186*  IRON 13*  RETICCTPCT 3.1   Sepsis Labs: Recent Labs  Lab 08/12/18 2315  LATICACIDVEN 0.9    Recent Results (from the past 240 hour(s))  Blood Culture (routine x 2)     Status: None (Preliminary result)   Collection Time: 08/12/18 11:40 PM  Result Value Ref Range Status   Specimen  Description BLOOD RIGHT ANTECUBITAL  Final   Special Requests   Final    BOTTLES DRAWN AEROBIC AND ANAEROBIC Blood Culture results may not be optimal due to an excessive volume of blood received in culture bottles   Culture   Final    NO GROWTH 2 DAYS Performed at [facility] [address] [place], [place] [address]    Report Status PENDING  Incomplete  Blood Culture (routine x 2)     Status: None (Preliminary result)   Collection Time: 08/12/18 11:56 PM  Result Value Ref Range Status   Specimen Description BLOOD RIGHT ARM  Final   Special Requests   Final    BOTTLES DRAWN AEROBIC ONLY Blood Culture results may not be optimal due to an excessive volume of blood received in culture bottles   Culture  Final    NO GROWTH 2 DAYS Performed at [facility], [place] [address], [place], [place] [address]    Report Status PENDING  Incomplete  MRSA PCR Screening     Status: None   Collection Time: 08/13/18  5:38 AM  Result Value Ref Range Status   MRSA by PCR NEGATIVE NEGATIVE Final    Comment:        The GeneXpert MRSA Assay (FDA approved for NASAL specimens only), is one component of a comprehensive MRSA colonization surveillance program. It is not intended to diagnose MRSA infection nor to guide or monitor treatment for MRSA infections. Performed at [facility], [place] [address] [place], [place] [address]          Radiology Studies: Dg Chest Port 1 View  Result Date: 08/15/2018 CLINICAL DATA:  Cough and pneumonia EXAM: PORTABLE CHEST 1 VIEW COMPARISON:  08/13/2018 FINDINGS: Cardiac shadow is stable. Elevation of the right hemidiaphragm is again identified and stable. Stable blunting of the right costophrenic angle is seen. Postsurgical changes are again noted on right. Persistent increased density in the left base is noted stable from the previous exam. Postsurgical changes in the right shoulder are noted new from the prior exam. No acute bony abnormality is  seen. Stable scoliosis is noted. IMPRESSION: Stable increased density in the left base as described. Interval shoulder replacement Electronically Signed   By: Inez Catalina M.D.   On: 08/15/2018 08:04   Dg Shoulder Left Port  Result Date: 08/14/2018 CLINICAL DATA:  Status post left shoulder replacement. EXAM: LEFT SHOULDER - 1 VIEW COMPARISON:  None. FINDINGS: Status post left shoulder replacement. Hardware is in good position on a single frontal view. IMPRESSION: Left shoulder replacement as above. Electronically Signed   By: Dorise Bullion III M.D   On: 08/14/2018 16:51        Scheduled Meds: . atorvastatin  10 mg Oral q1800  . busPIRone  10 mg Oral TID  . docusate sodium  100 mg Oral BID  . escitalopram  10 mg Oral Daily  . feeding supplement  1 Container Oral TID BM  . folic acid  1 mg Oral Daily  . lidocaine  1 patch Transdermal Q24H  . mouth rinse  15 mL Mouth Rinse BID  . metoprolol tartrate  10 mg Intravenous Q6H  . sodium chloride flush  3 mL Intravenous Q12H  . thiamine  100 mg Oral Daily   Continuous Infusions: . 0.9 % NaCl with KCl 20 mEq / L 100 mL/hr at 08/15/18 1841  . acetaminophen 1,000 mg (08/16/18 0519)  . magnesium sulfate 1 - 4 g bolus IVPB       LOS: 3 days    Time spent: [place], MD [facility]   If 7PM-7AM, please contact night-coverage [email or web address] Password [email or web address] 08/16/2018, 11:23 AM

## 2018-08-16 NOTE — Evaluation (Signed)
Occupational Therapy Evaluation Patient Details Name: [name] MRN: [id number] DOB: May 15, 1951 Today's Date: 08/16/2018    History of Present Illness Pt is a 68 y.o. female with medical history significant for hypertension, anxiety, cancer of the right lung status post chemotherapy, now presenting to the emergency department after she was found down on the floor, reporting that she had fallen on 08/10/2018 and was unable to get up.  S/p L reverse total shoulder arthroplasty on 08/14/18  after finding L displaced proximal humerus fracture.  CIWA protocol.     Clinical Impression   PTA patinet reports independent and driving.  Admitted for above and limited by problem list below, including cognition, pain, impaired balance, and decreased activity tolerance.  Noted hypotensive, on 2L supplemental oxygen via [place] with HR ranging up to 110 and RR to 34.  Patient educated on safety, precautions, sling mgmt and wear schedule, exercises and ADLs.  Requires max assist for UB ADLs, total assist +2 for LB ADLs, and max assist for stand pivot transfers.  She will benefit from continued OT services while admitted and after dc at CIR level in order to optimize independence and return to PLOF.  Will follow.      Follow Up Recommendations  CIR;Supervision/Assistance - 24 hour    Equipment Recommendations  3 in 1 bedside commode    Recommendations for Other Services Rehab consult;PT consult     Precautions / Restrictions Precautions Precautions: Shoulder;Fall Type of Shoulder Precautions: no shoulder ROM, active ROM elbow/wrist/hand  Shoulder Interventions: Shoulder sling/immobilizer;At all times;Off for dressing/bathing/exercises Precaution Booklet Issued: Yes (comment) Precaution Comments: reviewed precautions with patinet  Required Braces or Orthoses: Sling Restrictions Weight Bearing Restrictions: Yes LUE Weight Bearing: Non weight bearing      Mobility Bed Mobility Overal bed mobility: Needs  Assistance Bed Mobility: Sit to Supine       Sit to supine: Max assist;HOB elevated   General bed mobility comments: max assist to support trunk, position L UE, and manage LEs into bed; patinet assists minimally  Transfers Overall transfer level: Needs assistance Equipment used: None Transfers: Sit to/from [facility] Sit to Stand: Max assist Stand pivot transfers: Max assist       General transfer comment: max assist to power up into standing, for balance and safety with cueing for sequencing to transtion to EOB    Balance Overall balance assessment: Needs assistance Sitting-balance support: No upper extremity supported;Feet supported Sitting balance-Leahy Scale: Poor     Standing balance support: During functional activity;Single extremity supported Standing balance-Leahy Scale: Poor Standing balance comment: reliant on UE and external support                           ADL either performed or assessed with clinical judgement   ADL Overall ADL's : Needs assistance/impaired Eating/Feeding: Minimal assistance;Sitting   Grooming: Minimal assistance;Sitting   Upper Body Bathing: Maximal assistance;Sitting;Cueing for compensatory techniques;Cueing for UE precautions Upper Body Bathing Details (indicate cue type and reason): decreased use of L UE and limited by precautions  Lower Body Bathing: +2 for physical assistance;+2 for safety/equipment;Bed level;Maximal assistance Lower Body Bathing Details (indicate cue type and reason): decreased reach to B LEs, 2+ assist in standing  Upper Body Dressing : Maximal assistance;Sitting;Cueing for compensatory techniques;Cueing for UE precautions   Lower Body Dressing: Total assistance;With caregiver independent assisting;Sit to/from stand   Toilet Transfer: Maximal assistance;Stand-pivot Toilet Transfer Details (indicate cue type and reason): simulated to  EOB from recliner  Toileting- Clothing Manipulation  and Hygiene: Total assistance;+2 for physical assistance;+2 for safety/equipment;Sit to/from stand       Functional mobility during ADLs: Maximal assistance General ADL Comments: patinet limited by cognitoin, pain, balance, decreased tolerance     Vision Baseline Vision/History: Wears glasses Wears Glasses: At all times Patient Visual Report: No change from baseline Additional Comments: further assessment needed     Perception     Praxis      Pertinent Vitals/Pain Pain Assessment: Faces Faces Pain Scale: Hurts even more Pain Location: L UE  Pain Descriptors / Indicators: Discomfort;Operative site guarding Pain Intervention(s): Limited activity within patient's tolerance;Repositioned     Hand Dominance Right   Extremity/Trunk Assessment Upper Extremity Assessment Upper Extremity Assessment: LUE deficits/detail LUE Deficits / Details: s/p shoulder surgery, sensation intact  LUE: Unable to fully assess due to immobilization;Unable to fully assess due to pain LUE Sensation: WNL LUE Coordination: decreased fine motor;decreased gross motor   Lower Extremity Assessment Lower Extremity Assessment: Defer to PT evaluation   Cervical / Trunk Assessment Cervical / Trunk Assessment: Normal   Communication Communication Communication: No difficulties   Cognition Arousal/Alertness: Lethargic Behavior During Therapy: Anxious;Flat affect Overall Cognitive Status: Impaired/Different from baseline Area of Impairment: Orientation;Attention;Memory;Following commands;Safety/judgement;Awareness;Problem solving                 Orientation Level: Disoriented to;Time(reports 1920) Current Attention Level: Focused Memory: Decreased short-term memory;Decreased recall of precautions Following Commands: Follows one step commands inconsistently;Follows one step commands with increased time Safety/Judgement: Decreased awareness of deficits Awareness: Emergent Problem Solving: Slow  processing;Decreased initiation;Difficulty sequencing;Requires verbal cues;Requires tactile cues General Comments: pt limited by fatigue, aware of need for 2 assist transfer for safety but hesistant to needing assist for dc home    General Comments  HR up to 110 during session, oxgyen 90% or greater on 2L supplemental via [place] but RR up to 34    Exercises Exercises: Shoulder;Other exercises Shoulder Exercises Elbow Flexion: AAROM;Left;10 reps;Seated Elbow Extension: AAROM;Left;10 reps;Seated Wrist Flexion: AAROM;Left;10 reps;Seated Wrist Extension: AAROM;Left;10 reps;Seated Digit Composite Flexion: AROM;Left;10 reps;Seated(but requires cueing to complete each rep) Composite Extension: AROM;Left;10 reps;Seated(cueing to complete each rep) Other Exercises Other Exercises: AAROM supination/pronation L UE 10 reps Other Exercises:     Shoulder Instructions Shoulder Instructions Donning/doffing shirt without moving shoulder: Maximal assistance Method for sponge bathing under operated UE: Maximal assistance Donning/doffing sling/immobilizer: Maximal assistance Correct positioning of sling/immobilizer: Maximal assistance ROM for elbow, wrist and digits of operated UE: Moderate assistance Sling wearing schedule (on at all times/off for ADL's): Maximal assistance Positioning of UE while sleeping: Maximal assistance    Home Living Family/patient expects to be discharged to:: Private residence Living Arrangements: Alone Available Help at Discharge: Friend(s);Available 24 hours/day Type of Home: Apartment(1st floor ) Home Access: Level entry     Home Layout: One level     Bathroom Shower/Tub: (unclear "trying to find a tub shower" reports basin bathing )   Bathroom Toilet: Standard     Home Equipment: None          Prior Functioning/Environment Level of Independence: Independent        Comments: drive, independent IADLs, meds/finances         OT Problem List: Decreased  strength;Decreased range of motion;Decreased activity tolerance;Impaired balance (sitting and/or standing);Decreased coordination;Decreased cognition;Decreased safety awareness;Decreased knowledge of use of DME or AE;Decreased knowledge of precautions;Impaired UE functional use;Pain      OT Treatment/Interventions: Self-care/ADL training;Therapeutic exercise;Energy conservation;DME and/or AE instruction;Therapeutic  activities;Cognitive remediation/compensation;Patient/family education;Balance training    OT Goals(Current goals can be found in the care plan section) Acute Rehab OT Goals Patient Stated Goal: to get back into the bed OT Goal Formulation: With patient Time For Goal Achievement: 08/30/18 Potential to Achieve Goals: Good  OT Frequency: Min 3X/week   Barriers to D/C:            Co-evaluation              AM-PAC OT "6 Clicks" Daily Activity     Outcome Measure Help from another person eating meals?: A Little Help from another person taking care of personal grooming?: A Little Help from another person toileting, which includes using toliet, bedpan, or urinal?: Total Help from another person bathing (including washing, rinsing, drying)?: A Lot Help from another person to put on and taking off regular upper body clothing?: A Lot Help from another person to put on and taking off regular lower body clothing?: Total 6 Click Score: 12   End of Session Equipment Utilized During Treatment: Gait belt;Other (comment)(sling) Nurse Communication: Mobility status  Activity Tolerance: Patient limited by fatigue;Patient limited by pain Patient left: in bed;with call bell/phone within reach;with bed alarm set  OT Visit Diagnosis: Unsteadiness on feet (R26.81);Other abnormalities of gait and mobility (R26.89);Muscle weakness (generalized) (M62.81);Pain;History of falling (Z91.81) Pain - Right/Left: Left Pain - part of body: Shoulder;Arm                Time: 2500-3704 OT Time  Calculation (min): 41 min Charges:  OT General Charges $OT Visit: 1 Visit OT Evaluation $OT Eval Moderate Complexity: 1 Mod OT Treatments $Self Care/Home Management : 8-22 mins $Therapeutic Exercise: 8-22 mins  Delight Stare, OT Acute Rehabilitation Services Pager [phone] Office [phone]    Delight Stare 08/16/2018, 9:55 AM

## 2018-08-16 NOTE — Progress Notes (Signed)
Bilateral lower extremities venous duplex exam completed. Please see preliminary notes on CV PROC under chart review. Arhianna Ebey H Anaiz Qazi(RDMS RVT) 08/16/18 6:07 PM

## 2018-08-16 NOTE — Evaluation (Signed)
Physical Therapy Evaluation Patient Details Name: [name] MRN: [id number] DOB: Nov 19, 1950 Today's Date: 08/16/2018   History of Present Illness  Pt is a 68 y.o. female with medical history significant for hypertension, anxiety, cancer of the right lung status post chemotherapy, now presenting to the emergency department after she was found down on the floor, reporting that she had fallen on 08/10/2018 and was unable to get up.  S/p L reverse total shoulder arthroplasty on 08/14/18  after finding L displaced proximal humerus fracture.  CIWA protocol.    Clinical Impression  Patient presents with decreased independence with mobility due to weakness, pain, decreased activity tolerance, poor balance and decreased cognitive abilities.  She was living alone previously, but currently needing max to +2 A for mobility and unable to tolerate upright well.  Feel she will need skilled PT in the acute setting to address issues and progress mobility as tolerated.      Follow Up Recommendations SNF    Equipment Recommendations  Other (comment)(TBA)    Recommendations for Other Services       Precautions / Restrictions Precautions Precautions: Shoulder;Fall Type of Shoulder Precautions: no shoulder ROM, active ROM elbow/wrist/hand  Shoulder Interventions: Shoulder sling/immobilizer;At all times;Off for dressing/bathing/exercises Required Braces or Orthoses: Sling Restrictions LUE Weight Bearing: Non weight bearing      Mobility  Bed Mobility Overal bed mobility: Needs Assistance Bed Mobility: Supine to Sit     Supine to sit: Max assist     General bed mobility comments: assist for trunk, LE's increased time  Transfers Overall transfer level: Needs assistance Equipment used: 1 person hand held assist Transfers: Sit to/from Stand;Stand Pivot Transfers Sit to Stand: Max assist Stand pivot transfers: Max assist;+2 safety/equipment       General transfer comment: lifting assist to  stand, then pt repoted needed to sit back down; second stand with assist  utilizing L HHA, RN close to assist with lines during SPT  Ambulation/Gait             General Gait Details: unable  Stairs            Wheelchair Mobility    Modified Rankin (Stroke Patients Only)       Balance Overall balance assessment: Needs assistance   Sitting balance-Leahy Scale: Poor Sitting balance - Comments: reliant on L UE     Standing balance-Leahy Scale: Poor Standing balance comment: reliant on UE and external support                             Pertinent Vitals/Pain Faces Pain Scale: Hurts whole lot Pain Location: L UE  Pain Descriptors / Indicators: Discomfort;Operative site guarding Pain Intervention(s): Monitored during session;Repositioned    Home Living Family/patient expects to be discharged to:: Private residence Living Arrangements: Alone Available Help at Discharge: Friend(s);Available 24 hours/day Type of Home: Apartment(first floor) Home Access: Level entry     Home Layout: One level Home Equipment: None      Prior Function Level of Independence: Independent         Comments: drive, independent IADLs, meds/finances      Hand Dominance        Extremity/Trunk Assessment   Upper Extremity Assessment Upper Extremity Assessment: Defer to OT evaluation    Lower Extremity Assessment Lower Extremity Assessment: Generalized weakness       Communication      Cognition Arousal/Alertness: Awake/alert Behavior During Therapy: Agitated;Anxious Overall Cognitive Status:  Impaired/Different from baseline                     Current Attention Level: Focused Memory: Decreased short-term memory;Decreased recall of precautions Following Commands: Follows one step commands inconsistently;Follows one step commands with increased time Safety/Judgement: Decreased awareness of deficits   Problem Solving: Slow processing;Decreased  initiation;Difficulty sequencing;Requires verbal cues;Requires tactile cues        General Comments General comments (skin integrity, edema, etc.): on RA SpO2 dropped, but poor reading due to probe falling off her hand, replaced O2 at rest and RN in to give breathing treatment    Exercises     Assessment/Plan    PT Assessment Patient needs continued PT services  PT Problem List Decreased strength;Cardiopulmonary status limiting activity;Decreased activity tolerance;Decreased cognition;Decreased balance;Decreased safety awareness;Decreased mobility;Decreased knowledge of precautions       PT Treatment Interventions DME instruction;Therapeutic exercise;Gait training;Balance training;Functional mobility training;Patient/family education;Therapeutic activities;Cognitive remediation    PT Goals (Current goals can be found in the Care Plan section)  Acute Rehab PT Goals Patient Stated Goal: to go home PT Goal Formulation: With patient Time For Goal Achievement: 08/30/18 Potential to Achieve Goals: Fair    Frequency Min 3X/week   Barriers to discharge        Co-evaluation               AM-PAC PT "6 Clicks" Mobility  Outcome Measure Help needed turning from your back to your side while in a flat bed without using bedrails?: A Lot Help needed moving from lying on your back to sitting on the side of a flat bed without using bedrails?: A Lot Help needed moving to and from a bed to a chair (including a wheelchair)?: A Lot Help needed standing up from a chair using your arms (e.g., wheelchair or bedside chair)?: Total Help needed to walk in hospital room?: Total Help needed climbing 3-5 steps with a railing? : Total 6 Click Score: 9    End of Session Equipment Utilized During Treatment: Gait belt;Oxygen Activity Tolerance: Patient limited by pain;Patient limited by fatigue Patient left: in chair;with nursing/sitter in room   PT Visit Diagnosis: Other abnormalities of gait  and mobility (R26.89);Muscle weakness (generalized) (M62.81);History of falling (Z91.81)    Time: 5885-0277 PT Time Calculation (min) (ACUTE ONLY): 23 min   Charges:   PT Evaluation $PT Eval High Complexity: 1 High PT Treatments $Therapeutic Activity: 8-22 mins        Magda Kiel, [place] [facility] [phone] 08/16/2018   Reginia Naas 08/16/2018, 2:45 PM

## 2018-08-16 NOTE — Progress Notes (Signed)
   Subjective: 2 Days Post-Op Procedure(s) (LRB): REVERSE SHOULDER ARTHROPLASTY (Left)  Recheck left shoulder 2 days s/p reverse total shoulder Pt c/o mild to moderate soreness in the left shoulder Denies any numbness or tingling distally Patient reports pain as moderate.  Objective:   VITALS:   Vitals:   08/16/18 0800 08/16/18 1123  BP: (!) 97/58 126/70  Pulse: 84 98  Resp: (!) 25 (!) 32  Temp: 98.1 F (36.7 C) 98.6 F (37 C)  SpO2: 95% 98%    Left shoulder: resolving ecchymosis Healing incision nv intact distally No rashes or edema Sling in place  LABS Recent Labs    08/14/18 0204 08/15/18 0500 08/15/18 1736 08/16/18 0520  HGB 9.4* 10.5* 8.5* 9.2*  HCT 30.2* 33.7* 25.0* 30.2*  WBC 7.6 9.6  --  6.0  PLT 159 193  --  180    Recent Labs    08/14/18 0204 08/15/18 0500 08/15/18 1736 08/16/18 0520  NA 139 139 132* 140  K 4.3 4.2 3.5 3.9  BUN 7* 6*  --  6*  CREATININE 0.31* 0.49  --  0.37*  GLUCOSE 69* 119*  --  90     Assessment/Plan: 2 Days Post-Op Procedure(s) (LRB): REVERSE SHOULDER ARTHROPLASTY (Left) Currently the shoulder is stable  Dressing changed today Follow up in the office in 2 weeks after discharge Pain management as needed Will continue to follow     Kathrynn Speed, [place] [facility] now [employer] [facility] [address], [place], [place], [place] [address] Phone: [phone] [email or web address] Facebook  [employer]

## 2018-08-16 NOTE — Progress Notes (Signed)
Patient stil sounds congested, spoke with pulmonologist about albuterol treatments prn q2 as needed. Patient is not compliant with treatments, stating "she does not like them". Nurse at bedside the course of treatment encouraging patient to physically put the tube in her mouth, to inhale, to keep a seal, and to complete. 2 treatments given and patient did not complete either one of them.   Patient tried to refuse working with therapies.  Nurse asked patient if she wants to live and patient replied yes.   There are some mental/emotional issues: patient always wants the lights on and the television and every time Nurse leaves she either asks "are you leaving me now" or makes the statement "so you're leaving me".

## 2018-08-16 NOTE — Progress Notes (Signed)
Nutrition Follow-up  DOCUMENTATION CODES:   Non-severe (moderate) malnutrition in context of social or environmental circumstances  INTERVENTION:  Provide Ensure Enlive po TID, each supplement provides 350 kcal and 20 grams of protein.  Encourage adequate PO intake.   NUTRITION DIAGNOSIS:   Moderate Malnutrition related to social / environmental circumstances as evidenced by energy intake < 75% for > or equal to 3 months, mild fat depletion, mild muscle depletion; ongoing  GOAL:   Patient will meet greater than or equal to 90% of their needs; progressing  MONITOR:   PO intake, Supplement acceptance, Labs, Weight trends, I & O's, Skin  REASON FOR ASSESSMENT:   Malnutrition Screening Tool    ASSESSMENT:   68 yo female, admitted with alcohol dependence with withdrawal. PMH significant for HTN, lung cancer s/p chemotherapy, anemia.  PROCEDURE (2/3): REVERSE SHOULDER ARTHROPLASTY (Left) with tuberosity repair   Meal completion has been 25%. Pt reports poor appetite and nausea during time of visit. RN aware. Boost Breeze had been ordered however pt refused them not liking the taste. Supplement orders have been modified to Ensure instead. Pt agreeable to consuming Ensure. Pt encouraged to eat her food at meals and to drink her supplements.   Labs and medications reviewed. Magnesium low at 1.6. Magnesium sulfate ordered.   Diet Order:   Diet Order            Diet regular Room service appropriate? Yes; Fluid consistency: Thin  Diet effective now              EDUCATION NEEDS:   No education needs have been identified at this time  Skin:  Skin Assessment: Skin Integrity Issues: Skin Integrity Issues:: Incisions Incisions: L arm Other: Abrasion L arm, leg; cellulitis BL lower legs; ecchymosis L chest, arm, shoulder  Last BM:  1/30  Height:   Ht Readings from Last 1 Encounters:  08/13/18 5' 4.5" (1.638 m)    Weight:   Wt Readings from Last 1 Encounters:   08/16/18 67.1 kg    Ideal Body Weight:  55.7 kg  BMI:  Body mass index is 25 kg/m.  Estimated Nutritional Needs:   Kcal:  5009-3818 (25-30 kcal/kg ABW)  Protein:  65-85 gm (1.0-1.3 g/kg ABW)  Fluid:  1 mL/kcal or per MD    Corrin Parker, MS, RD, LDN Pager # [phone] After hours/ weekend pager # [phone]

## 2018-08-16 NOTE — Progress Notes (Signed)
Patient more alert this a.m. 1st and 3rd shift Nurse ambulated to the chair at [place]. Patient refused breakfast but did take an Ensure. C/o back pain and stating that she "wants to go home". (not appropriate)  Low blood pressure, administered a half liter bolus (see chart) Low blood sugar administered 25mg  dextrose IV (see chart)  Patient working with OT Order put in for PT to work with patient as well.   Dietician is already on board, concerned about patient being malnourished, she has made multiple comments about "being fat", "always being on a diet" and "I do not eat a lot at home".   Patient denies eating disorder, patient denies need for a financial advisor when asked if she can afford groceries.

## 2018-08-16 NOTE — Social Work (Addendum)
Will complete FL2, await CIR consult and PT evaluation. Pt expressing desire to go home to bedside RN today, will evaluate for willingness to d/c to SNF if CIR is deemed not an appropriate plan.   Westley Hummer, MSW, [place] Work [phone]

## 2018-08-16 NOTE — Care Management Important Message (Signed)
Important Message  Patient Details  Name: [name] MRN: [id number] Date of Birth: [birth year]   Medicare Important Message Given:  Yes    Orbie Pyo 08/16/2018, 4:21 PM

## 2018-08-17 ENCOUNTER — Inpatient Hospital Stay (HOSPITAL_COMMUNITY): Payer: PPO

## 2018-08-17 LAB — COMPREHENSIVE METABOLIC PANEL
ALT: 45 U/L — ABNORMAL HIGH (ref 0–44)
AST: 92 U/L — ABNORMAL HIGH (ref 15–41)
Alkaline Phosphatase: 76 U/L (ref 38–126)
Anion gap: 7 (ref 5–15)
BUN: <5 mg/dL — ABNORMAL LOW (ref 8–23)
CO2: 27 mmol/L (ref 22–32)
Calcium: 8 mg/dL — ABNORMAL LOW (ref 8.9–10.3)
Chloride: 105 mmol/L (ref 98–111)
Creatinine, Ser: 0.36 mg/dL — ABNORMAL LOW (ref 0.44–1.00)
GFR calc Af Amer: >60 mL/min (ref >60)
GFR calc non Af Amer: >60 mL/min (ref >60)
Potassium: 3.8 mmol/L (ref 3.5–5.1)
Sodium: 139 mmol/L (ref 135–145)
Total Bilirubin: 1.1 mg/dL (ref 0.3–1.2)
Total Protein: 4.4 g/dL — ABNORMAL LOW (ref 6.5–8.1)

## 2018-08-17 LAB — CBC
HCT: 28.4 % — ABNORMAL LOW (ref 36.0–46.0)
Hemoglobin: 9 g/dL — ABNORMAL LOW (ref 12.0–15.0)
MCH: 35.2 pg — ABNORMAL HIGH (ref 26.0–34.0)
MCHC: 31.7 g/dL (ref 30.0–36.0)
MCV: 110.9 fL — ABNORMAL HIGH (ref 80.0–100.0)
Platelets: 184 10*3/uL (ref 150–400)
RBC: 2.56 MIL/uL — ABNORMAL LOW (ref 3.87–5.11)
RDW: 13.6 % (ref 11.5–15.5)
WBC: 5.7 10*3/uL (ref 4.0–10.5)
nRBC: 0 % (ref 0.0–0.2)

## 2018-08-17 LAB — COMPREHENSIVE METABOLIC PANEL WITH GFR
Albumin: 2.2 g/dL — ABNORMAL LOW (ref 3.5–5.0)
Glucose, Bld: 83 mg/dL (ref 70–99)

## 2018-08-17 LAB — MAGNESIUM: Magnesium: 1.3 mg/dL — ABNORMAL LOW (ref 1.7–2.4)

## 2018-08-17 LAB — BRAIN NATRIURETIC PEPTIDE: B Natriuretic Peptide: 180.3 pg/mL — ABNORMAL HIGH (ref 0.0–100.0)

## 2018-08-17 LAB — GLUCOSE, CAPILLARY: Glucose-Capillary: 83 mg/dL (ref 70–99)

## 2018-08-17 MED ORDER — AMOXICILLIN-POT CLAVULANATE 875-125 MG PO TABS
1.0000 | ORAL_TABLET | Freq: Two times a day (BID) | ORAL | Status: AC
  Administered 2018-08-17 – 2018-08-21 (×10): 1 via ORAL
  Filled 2018-08-17 (×10): qty 1

## 2018-08-17 MED ORDER — BISACODYL 5 MG PO TBEC
10.0000 mg | DELAYED_RELEASE_TABLET | Freq: Once | ORAL | Status: AC
  Administered 2018-08-17: 10 mg via ORAL
  Filled 2018-08-17: qty 2

## 2018-08-17 MED ORDER — METHYLPREDNISOLONE SODIUM SUCC 40 MG IJ SOLR
40.0000 mg | Freq: Three times a day (TID) | INTRAMUSCULAR | Status: DC
  Administered 2018-08-17 – 2018-08-19 (×6): 40 mg via INTRAVENOUS
  Filled 2018-08-17 (×6): qty 1

## 2018-08-17 MED ORDER — MAGNESIUM SULFATE 4 GM/100ML IV SOLN
4.0000 g | Freq: Once | INTRAVENOUS | Status: AC
  Administered 2018-08-17: 4 g via INTRAVENOUS
  Filled 2018-08-17: qty 100

## 2018-08-17 MED ORDER — FUROSEMIDE 10 MG/ML IJ SOLN
INTRAMUSCULAR | Status: AC
  Filled 2018-08-17: qty 4

## 2018-08-17 MED ORDER — FUROSEMIDE 10 MG/ML IJ SOLN
40.0000 mg | Freq: Once | INTRAMUSCULAR | Status: AC
  Administered 2018-08-17: 40 mg via INTRAVENOUS

## 2018-08-17 NOTE — Consult Note (Signed)
            [facility] CM Primary Care Navigator  08/17/2018  [name] Jul 29, 1950 [id number]   Went to see patient at the bedside to identify possible discharge needs but she is fast asleep. No noted family at the bedside.  Will attempt to follow-up and see patient at another time for further THN-CM needs when she is available to talk with in the room.     Addendum (08/21/18):  Went back to seepatient at the bedside toidentify possible discharge needs.  Patient reportshaving a "fall at home and broke left shoulder" that resulted to this admission/ surgery.(fall at home with prolonged down time status post reverse left shoulder arthroplasty with tuberosity repair)  Patientendorses Dr. Wenda Low with [facility] as her primary care provider.   [facility] [address] [facility].  Patientstates that she has beenmanaging her own medications at home straight out of the containers.  Patient reports that she has been driving prior to admission/ surgery but has friends who can provide transportation for her, otherwise, will use taxi to transport her to doctors' appointments as stated.  She verbalized living alone but states that she has friends whom she can rely on to assist with her needs if any.  Anticipated plan for dischargeis skilled nursing facility (SNF)for rehab per therapy recommendation. Patient voiced reluctance of going to SNF, but does not have anybody at home to provide 24/7 care for her.   Patientvoiced understandingto call primaryprovider's office when shereturnshome,for a post discharge follow-up visitwithin1- 2 weeks or sooner if needs arise.Patient letter (with PCP's contact number) was provided asher reminder.   Discussed with patientaboutTHN CM services available for health management andresourcesat homeand she declined services at this  time. Patientvoicedunderstanding todiscuss with primary care provider onher next visit,aboutfurther needsand assistanceinmanaging her healthneeds and issues- when she getsback home.  Patientwas encouraged to seekreferral from primary care provider to [facility] care management asdeemed necessary and appropriatefor any servicesin the nearfuture-onceshe returnshome.   [facility] care management information was provided for future needsthat she may have.  Primary care provider's office is listed as providing transition of care (TOC) follow-up.    For additional questions please contact:  Edwena Felty A. Shawnmichael Parenteau, BSN, RN-BC [facility] PRIMARY CARE Navigator Cell: [phone]

## 2018-08-17 NOTE — Plan of Care (Signed)

## 2018-08-17 NOTE — Clinical Social Work Note (Signed)
Clinical Social Work Assessment  Patient Details  Name: [name] MRN: [id number] Date of Birth: Feb 03, 1951  Date of referral:  08/17/18               Reason for consult:  Facility Placement, Discharge Planning                Permission sought to share information with:  Family Supports, Customer service manager Permission granted to share information::  Yes, Verbal Permission Granted  Name::     [name]  Agency::   SNFs  Relationship::  step daughter  Contact Information:  [phone]  Housing/Transportation Living arrangements for the past 2 months:  Apartment Source of Information:  Patient Patient Interpreter Needed:  None Criminal Activity/Legal Involvement Pertinent to Current Situation/Hospitalization:  No - Comment as needed Significant Relationships:  Adult Children Lives with:  Self Do you feel safe going back to the place where you live?  Yes Need for family participation in patient care:  Yes (Comment)  Care giving concerns:  Pt from home alone, upon admit to hospital she was intoxicated and had fractured her femur. Pt states she wants to go home but requires heavy assist with ADLs and IADLs. Pt also denies substance use in heavy manner however is currently going through DTs. Pt will need assistance with care planning and post discharge support.   Social Worker assessment / plan: CSW met with pt at bedside. Pt in obvious discomfort. CSW introduced self, role, and reason for visit. Pt from home alone, she states that she does not have any family here in town- "theyre all at [employer]." CSW discussed reason for admit to hospital and recommendations made by therapy teams. Pt states that she will be fine going home. CSW provided multiple scenarios that could happen if pt went home with no support. Pt not able to coherently tell CSW how she would get support and from where.   When asked about ETOH use, pt denies heavy use and states she only drinks 2-3 glasses a  day. When asked why multiple bottles were surrounding her when EMS arrived to transport her to the hospital she states "its just because I can't take them out."  CSW requested permission to speak with Margretta Sidle, listed as a daughter on emergency contacts. Pt gave permission, CSW also feels this contact is necessary to establish what if any support pt has in the community as she is still lethargic and uncomfortable due to withdrawals.   CSW called and left HIPPA compliant message on number listed for [name]. Received a call back- confirmed that she is [name]'s step daughter. She lives in [place] and comes to visit pt. She has been concerned by pt living alone and had no knowledge of her being in the hospital. When asked about pt ETOH use pt step daughter states that she normally drinks 2-3 bottles a day. CSW requested that she come to the hospital and shared pt push back to recommendations made by therapy staff. Let [name] know that we have requested a psychiatric evaluation to assess to what degree pt understands her current status and risks of going home. Pt step daughter mentioned "I know you can't force her to go somewhere." She states that pt has cousins in [place] and a niece in [place]. Pt step daughter is aware of pt hx of lung cancer and also states that she recently was being seen for potential tongue cancer.  [name] is on her way to [facility]  in the next 1-2 days to support pt and care team with care planning.  Employment status:  Retired Nurse, adult PT Recommendations:  [place], [place] / Referral to community resources:  [place]  Patient/Family's Response to care:  Pt amenable to speaking with CSW but not engaged and guarded. Pt step daughter very amenable to speaking with CSW and states gratitude for update and alert that pt was in hospital.  Patient/Family's Understanding of and Emotional  Response to Diagnosis, Current Treatment, and Prognosis:  Pt does not have a clear understanding of her diagnosis, current treatment and prognosis. She is in denial about her ETOH use/abuse and continues to ask to go home. Pt step daughter during assessment appears to be in the loop with pt care and is able to come and support pt. She is only support pt allows in her life.  Pt lethargic and emotionally flat. Pt step daughter appropriate, pleasant and receptive, she is grateful her step mother is here getting care.  Emotional Assessment Appearance:  Appears older than stated age, Disheveled Attitude/Demeanor/Rapport:  Sedated, Inconsistent, Guarded Affect (typically observed):  Flat, Quiet, Guarded, Withdrawn Orientation:  Oriented to Self, Oriented to Place, Oriented to Situation Alcohol / Substance use:  Alcohol Use Psych involvement (Current and /or in the community):  Outpatient Provider  Discharge Needs  Concerns to be addressed:  Mental Health Concerns, Substance Abuse Concerns, Care Coordination, Discharge Planning Concerns Readmission within the last 30 days:  No Current discharge risk:  Substance Abuse, Lives alone, Lack of support system Barriers to Discharge:  Continued Medical Work up, [employer], [place] 08/17/2018, 1:24 PM

## 2018-08-17 NOTE — Progress Notes (Signed)
PROGRESS NOTE    [name]  NFA:[id number] DOB: 05/08/1951 DOA: 08/12/2018 PCP: Wenda Low, MD    Brief Narrative:  68 year old with past medical history relevant for depression/anxiety, COPD, hyperlipidemia, alcohol abuse admitted with falls and found to have left humeral fracture and significant alcohol withdrawal as well as electrolyte abnormalities.   Assessment & Plan:   Principal Problem:   Alcohol dependence with withdrawal (HCC) Active Problems:   COPD pfts pending/ active smoker   Closed left humeral fracture   Elevated LFTs   Hypokalemia   Macrocytic anemia   Hypotension due to hypovolemia   Closed fracture of left humerus   Malnutrition of moderate degree   H/O total shoulder replacement, left   #) Acute alcohol withdrawal: Improving with CIWA protocol.  Patient still not eager to discuss the exact amount she was drinking.  Continues to insist she only drank 2 glasses of wine a night. -Continue CIWA protocol -Continue thiamine and folate supplementation  #) Aspiration pneumonia: Likely in the setting of altered mental status from alcohol abuse. -Continue Augmentin started 08/17/2018  #) Left humeral fracture status post shoulder arthroplasty: -PT/OT consult - [facility] inpatient rehab recommended, will place consult  #) Electrolyte abnormalities: This is improved after treatment -Telemetry  #) Elevated LFTs: Likely secondary alcohol abuse, improving  #) Pain/psych: -Continue escitalopram 10 mg daily -Continue buspirone 10 mg 3 times daily  #) Hyperlipidemia: -Continue atorvastatin 10 mg daily  Fluids: Tolerating p.o. Electrolytes: Monitor and supplement Nutrition: Regular   prophylaxis: Enoxaparin  Disposition: Pending placement  Full code  Consultants:   Orthopedic surgery, Dr. Alma Friendly  PCCM  Procedures:   2/3/2020Left reverse total shoulder arthroplasty using [employer] System with tuberosity repair.  Antimicrobials:    None   Subjective: This morning the patient reports she is in pain.  She otherwise is getting an IV currently.  The nurse noted that patient was noted to be quite rhonchorous and have a fairly severe cough.  Chest x-ray this morning showed persistent pneumonia and likely aspiration.  Patient otherwise continues to report pain but denies any abdominal pain, nausea, vomiting, diarrhea.  Objective: Vitals:   08/17/18 0355 08/17/18 0500 08/17/18 0750 08/17/18 0858  BP: 117/71  125/74   Pulse: 86  91   Resp: (!) 25  (!) 24   Temp: 98 F (36.7 C)  (!) 97.5 F (36.4 C)   TempSrc: Axillary  Oral   SpO2: 100%  95% 98%  Weight:  68.2 kg    Height:        Intake/Output Summary (Last 24 hours) at 08/17/2018 1054 Last data filed at 08/17/2018 0932 Gross per 24 hour  Intake 1858.25 ml  Output 1350 ml  Net 508.25 ml   Filed Weights   08/15/18 0412 08/16/18 0408 08/17/18 0500  Weight: 66.8 kg 67.1 kg 68.2 kg    Examination:  General exam: Appears calm and comfortable  Respiratory system: Clear to auscultation.  Diminished lung sounds at bases, scattered rhonchi worse on right, no wheezes or crackles Cardiovascular system: Regular rate and rhythm, no murmurs Gastrointestinal system: Soft, nondistended, no rebound or guarding, plus bowel sounds Central nervous system: Alert and oriented.  Grossly intact, moving all extremities, right upper extremity limited by pain Extremities: No lower extremity edema. Skin: Numerous contusions over bilateral arms, right shoulder surgical site is clean dry and intact Psychiatry: Judgement and insight appear poor. Mood & affect flat    Data Reviewed: I have personally reviewed following labs and imaging  studies  CBC: Recent Labs  Lab 08/12/18 1857 08/13/18 0221 08/13/18 1943 08/14/18 0204 08/15/18 0500 08/15/18 1736 08/16/18 0520 08/17/18 0409  WBC 12.8* 7.4 8.2 7.6 9.6  --  6.0 5.7  NEUTROABS 10.9* 5.8  --   --   --   --   --   --   HGB  11.0* 7.5* 9.8* 9.4* 10.5* 8.5* 9.2* 9.0*  HCT 34.0* 24.1* 30.8* 30.2* 33.7* 25.0* 30.2* 28.4*  MCV 110.4* 114.2* 108.5* 110.2* 109.1*  --  111.9* 110.9*  PLT 238 167 177 159 193  --  180 242   Basic Metabolic Panel: Recent Labs  Lab 08/13/18 1943 08/14/18 0204 08/15/18 0500 08/15/18 1736 08/16/18 0520 08/17/18 0409  NA 138 139 139 132* 140 139  K 4.3 4.3 4.2 3.5 3.9 3.8  CL 109 110 102  --  105 105  CO2 23 20* 24  --  31 27  GLUCOSE 72 69* 119*  --  90 83  BUN 7* 7* 6*  --  6* <5*  CREATININE 0.37* 0.31* 0.49  --  0.37* 0.36*  CALCIUM 7.7* 7.8* 8.4*  --  8.2* 8.0*  MG 2.1 1.7 1.6*  --  1.6* 1.3*  PHOS  --  2.3* 3.0  --   --   --    GFR: Estimated Creatinine Clearance: 63.7 mL/min (A) (by C-G formula based on SCr of 0.36 mg/dL (L)). Liver Function Tests: Recent Labs  Lab 08/13/18 0221 08/14/18 0204 08/15/18 0500 08/16/18 0520 08/17/18 0409  AST 62* 60* 64* 83* 92*  ALT 45* 45* 46* 44 45*  ALKPHOS 63 71 74 68 76  BILITOT 0.9 0.9 1.0 1.0 1.1  PROT 3.7* 4.6* 5.2* 4.6* 4.4*  ALBUMIN 1.9* 2.5* 2.7* 2.3* 2.2*   Recent Labs  Lab 08/12/18 1857  LIPASE 20   No results for input(s): AMMONIA in the last 168 hours. Coagulation Profile: Recent Labs  Lab 08/12/18 1857  INR 0.95   Cardiac Enzymes: Recent Labs  Lab 08/12/18 1857  CKTOTAL 232   BNP (last 3 results) No results for input(s): PROBNP in the last 8760 hours. HbA1C: No results for input(s): HGBA1C in the last 72 hours. CBG: Recent Labs  Lab 08/13/18 2134 08/14/18 1000 08/15/18 0802 08/16/18 0826 08/16/18 1130  GLUCAP 121* 84 82 64* 118*   Lipid Profile: No results for input(s): CHOL, HDL, LDLCALC, TRIG, CHOLHDL, LDLDIRECT in the last 72 hours. Thyroid Function Tests: No results for input(s): TSH, T4TOTAL, FREET4, T3FREE, THYROIDAB in the last 72 hours. Anemia Panel: No results for input(s): VITAMINB12, FOLATE, FERRITIN, TIBC, IRON, RETICCTPCT in the last 72 hours. Sepsis Labs: Recent Labs   Lab 08/12/18 2315  LATICACIDVEN 0.9    Recent Results (from the past 240 hour(s))  Blood Culture (routine x 2)     Status: None (Preliminary result)   Collection Time: 08/12/18 11:40 PM  Result Value Ref Range Status   Specimen Description BLOOD RIGHT ANTECUBITAL  Final   Special Requests   Final    BOTTLES DRAWN AEROBIC AND ANAEROBIC Blood Culture results may not be optimal due to an excessive volume of blood received in culture bottles   Culture   Final    NO GROWTH 4 DAYS Performed at [facility], [place] [address] [place], [place] [address]    Report Status PENDING  Incomplete  Blood Culture (routine x 2)     Status: None (Preliminary result)   Collection Time: 08/12/18 11:56 PM  Result  Value Ref Range Status   Specimen Description BLOOD RIGHT ARM  Final   Special Requests   Final    BOTTLES DRAWN AEROBIC ONLY Blood Culture results may not be optimal due to an excessive volume of blood received in culture bottles   Culture   Final    NO GROWTH 4 DAYS Performed at [facility] [address] [place], [place] [address]    Report Status PENDING  Incomplete  MRSA PCR Screening     Status: None   Collection Time: 08/13/18  5:38 AM  Result Value Ref Range Status   MRSA by PCR NEGATIVE NEGATIVE Final    Comment:        The GeneXpert MRSA Assay (FDA approved for NASAL specimens only), is one component of a comprehensive MRSA colonization surveillance program. It is not intended to diagnose MRSA infection nor to guide or monitor treatment for MRSA infections. Performed at [facility], [place] [address] [place], [place] [address]          Radiology Studies: Dg Chest Port 1 View  Result Date: 08/17/2018 CLINICAL DATA:  Hypoxia.  Recent humerus surgery. EXAM: PORTABLE CHEST 1 VIEW COMPARISON:  Two days ago FINDINGS: Postoperative changes including volume loss on the right. There is new airspace opacity in the right lung. Chronic scar-like appearance at  the right apex. Haziness at the left base favoring atelectasis. Normal heart size. IMPRESSION: Airspace disease in the postoperative right lung, likely pneumonia or aspiration. Electronically Signed   By: Monte Fantasia M.D.   On: 08/17/2018 09:48   Vas [place] Lower Extremity Venous (dvt)  Result Date: 08/16/2018  Lower Venous Study Indications: Rule out DVT. Other Indications: History of DVT; Tachycardia. Risk Factors: Chemotherapy Cancer Lung cancer. Limitations: Body habitus and Edema; patient's immobility. Comparison Study: No prior study available. Performing Technologist: Rudell Cobb  Examination Guidelines: A complete evaluation includes B-mode imaging, spectral Doppler, color Doppler, and power Doppler as needed of all accessible portions of each vessel. Bilateral testing is considered an integral part of a complete examination. Limited examinations for reoccurring indications may be performed as noted.  Right Venous Findings: +---------+---------------+---------+-----------+----------+-------------------+          CompressibilityPhasicitySpontaneityPropertiesSummary             +---------+---------------+---------+-----------+----------+-------------------+ CFV      Full           Yes      Yes                                      +---------+---------------+---------+-----------+----------+-------------------+ SFJ      Full                                                             +---------+---------------+---------+-----------+----------+-------------------+ FV Prox  Full                                                             +---------+---------------+---------+-----------+----------+-------------------+ FV Mid   Full                                                             +---------+---------------+---------+-----------+----------+-------------------+  FV DistalFull                                                              +---------+---------------+---------+-----------+----------+-------------------+ PFV      Full                                                             +---------+---------------+---------+-----------+----------+-------------------+ POP      Full           Yes      Yes                                      +---------+---------------+---------+-----------+----------+-------------------+ PTV      Full                                                             +---------+---------------+---------+-----------+----------+-------------------+ PERO                                                  not well visualized +---------+---------------+---------+-----------+----------+-------------------+  Left Venous Findings: +---------+---------------+---------+-----------+----------+-------------------+          CompressibilityPhasicitySpontaneityPropertiesSummary             +---------+---------------+---------+-----------+----------+-------------------+ CFV      Full           Yes      Yes                                      +---------+---------------+---------+-----------+----------+-------------------+ SFJ      Full                                                             +---------+---------------+---------+-----------+----------+-------------------+ FV Prox  Full                                                             +---------+---------------+---------+-----------+----------+-------------------+ FV Mid   Full                                                             +---------+---------------+---------+-----------+----------+-------------------+  FV DistalFull                                                             +---------+---------------+---------+-----------+----------+-------------------+ PFV      Full                                                              +---------+---------------+---------+-----------+----------+-------------------+ POP      Full           Yes      Yes                                      +---------+---------------+---------+-----------+----------+-------------------+ PTV                                                   not well visualized                                                       due to edema        +---------+---------------+---------+-----------+----------+-------------------+ PERO                                                  not well visualized                                                       due to edema        +---------+---------------+---------+-----------+----------+-------------------+    Summary: Right: There is no evidence of deep vein thrombosis in the lower extremity. However, portions of this examination were limited- see technologist comments above. No cystic structure found in the popliteal fossa. Left: There is no evidence of deep vein thrombosis in the lower extremity. However, portions of this examination were limited- see technologist comments above. No cystic structure found in the popliteal fossa.  *See table(s) above for measurements and observations. Electronically signed by Curt Jews MD on 08/16/2018 at 9:40:48 PM.    Final         Scheduled Meds: . atorvastatin  10 mg Oral q1800  . busPIRone  10 mg Oral TID  . docusate sodium  100 mg Oral BID  . escitalopram  10 mg Oral Daily  . feeding supplement (ENSURE ENLIVE)  237 mL Oral TID BM  . folic acid  1 mg Oral Daily  . lidocaine  1 patch Transdermal Q24H  . mouth rinse  15 mL  Mouth Rinse BID  . metoprolol tartrate  10 mg Intravenous Q6H  . sodium chloride flush  3 mL Intravenous Q12H  . thiamine  100 mg Oral Daily   Continuous Infusions: . magnesium sulfate 1 - 4 g bolus IVPB 4 g (08/17/18 0929)     LOS: 4 days    Time spent: [place], MD [facility]   If 7PM-7AM,  please contact night-coverage [email or web address] Password [email or web address] 08/17/2018, 10:54 AM

## 2018-08-17 NOTE — Progress Notes (Signed)
Patient awake, anxious with rapid respirations. Lung sounds noted with exp.wheezing, patient SAT's noted in the 80's, respiratory therapists notified and asked to come to patient's room in addition to covering physician. New order for IV lasix 40gmg IV ordered, patient administered PRN alberterol 2.5mg  in addition too PRN lorazepam. Patient's HR-79 and BP-126/69. MD at the bedside. Plans are to order IV solu-medrol in addition to one time order for lasix.

## 2018-08-17 NOTE — Progress Notes (Signed)
Patient ID: [name], female   DOB: 1951/06/29, 68 y.o.   MRN: [id number]   Attempted to page call to Knox Royalty, MD at [phone]  to clarify what the specific need is for consult placed for competency. No return call. Will leave on the consult  list for 08/18/2018.   [place], FNP-C 08/17/2018       [phone]

## 2018-08-17 NOTE — Progress Notes (Signed)
While receiving handoff from night shift RN, patient called out stating she was nauseated and falling out the bed. Upon arrival to patient's room, patient noted to be lying in the center of the bed looking at the ceiling. Patient began c/o nausea. Vitals obtained. Patient's B/P-118/70, HR-94, SATS on 4L-94 and RR-26. Patient given PRN Reglan as needed for nausea.

## 2018-08-18 LAB — CBC
HCT: 32.3 % — ABNORMAL LOW (ref 36.0–46.0)
Hemoglobin: 10.4 g/dL — ABNORMAL LOW (ref 12.0–15.0)
MCH: 34.4 pg — ABNORMAL HIGH (ref 26.0–34.0)
MCHC: 32.2 g/dL (ref 30.0–36.0)
MCV: 107 fL — ABNORMAL HIGH (ref 80.0–100.0)
Platelets: 206 10*3/uL (ref 150–400)
RBC: 3.02 MIL/uL — ABNORMAL LOW (ref 3.87–5.11)
RDW: 13.1 % (ref 11.5–15.5)
WBC: 3.4 10*3/uL — ABNORMAL LOW (ref 4.0–10.5)
nRBC: 0 % (ref 0.0–0.2)

## 2018-08-18 LAB — COMPREHENSIVE METABOLIC PANEL
AST: 68 U/L — ABNORMAL HIGH (ref 15–41)
Albumin: 2.2 g/dL — ABNORMAL LOW (ref 3.5–5.0)
Anion gap: 15 (ref 5–15)
BUN: <5 mg/dL — ABNORMAL LOW (ref 8–23)
Calcium: 8.4 mg/dL — ABNORMAL LOW (ref 8.9–10.3)
Chloride: 91 mmol/L — ABNORMAL LOW (ref 98–111)
Creatinine, Ser: 0.45 mg/dL (ref 0.44–1.00)
GFR calc Af Amer: >60 mL/min (ref >60)
GFR calc non Af Amer: >60 mL/min (ref >60)
Glucose, Bld: 102 mg/dL — ABNORMAL HIGH (ref 70–99)
Potassium: 3.5 mmol/L (ref 3.5–5.1)
Total Bilirubin: 1.4 mg/dL — ABNORMAL HIGH (ref 0.3–1.2)
Total Protein: 4.7 g/dL — ABNORMAL LOW (ref 6.5–8.1)

## 2018-08-18 LAB — CULTURE, BLOOD (ROUTINE X 2)
Culture: NO GROWTH
Culture: NO GROWTH

## 2018-08-18 LAB — GLUCOSE, CAPILLARY: Glucose-Capillary: 93 mg/dL (ref 70–99)

## 2018-08-18 LAB — MAGNESIUM: Magnesium: 1.3 mg/dL — ABNORMAL LOW (ref 1.7–2.4)

## 2018-08-18 LAB — COMPREHENSIVE METABOLIC PANEL WITH GFR
ALT: 44 U/L (ref 0–44)
Alkaline Phosphatase: 82 U/L (ref 38–126)
CO2: 31 mmol/L (ref 22–32)
Sodium: 137 mmol/L (ref 135–145)

## 2018-08-18 NOTE — Progress Notes (Signed)
Orthopedics Progress Note  Subjective: Left shoulder is feeling better.  Objective:  Vitals:   08/18/18 0800 08/18/18 1149  BP: 131/71 120/65  Pulse: 84 86  Resp: (!) 27 (!) 21  Temp:  98.5 F (36.9 C)  SpO2: 96% 96%    General: Awake and alert  Musculoskeletal: Left shoulder and arm way less swollen than pre-op and immediate post op. No active drainage from the left shoulder incision. No erythema. Distally neurovascularly intact  Lab Results  Component Value Date   WBC 3.4 (L) 08/18/2018   HGB 10.4 (L) 08/18/2018   HCT 32.3 (L) 08/18/2018   MCV 107.0 (H) 08/18/2018   PLT 206 08/18/2018       Component Value Date/Time   NA 137 08/18/2018 0543   NA 144 03/26/2009 0824   K 3.5 08/18/2018 0543   K 4.7 03/26/2009 0824   CL 91 (L) 08/18/2018 0543   CL 106 03/26/2009 0824   CO2 31 08/18/2018 0543   CO2 31 03/26/2009 0824   GLUCOSE 102 (H) 08/18/2018 0543   GLUCOSE 96 03/26/2009 0824   BUN <5 (L) 08/18/2018 0543   BUN 9 03/26/2009 0824   CREATININE 0.45 08/18/2018 0543   CREATININE 0.5 (L) 03/26/2009 0824   CALCIUM 8.4 (L) 08/18/2018 0543   CALCIUM 9.4 03/26/2009 0824   GFRNONAA >60 08/18/2018 0543   GFRAA >60 08/18/2018 0543    Lab Results  Component Value Date   INR 0.95 08/12/2018    Assessment/Plan: POD #5 s/p Procedure(s): REVERSE SHOULDER ARTHROPLASTY for fractured shoulder Stable from ortho standpoint. Continue PT,OT NWB on the left arm.  Ok to use for gentle ADLs only Will see her again Monday. SNF placement pending  Doran Heater. Veverly Fells, MD 08/18/2018 12:34 PM

## 2018-08-18 NOTE — Progress Notes (Signed)
PROGRESS NOTE  [name] QBH:[id number] DOB: Nov 02, 1950 DOA: 08/12/2018 PCP: Wenda Low, MD  HPI/Recap of past 30 hours: 68 year old with past medical history relevant for depression/anxiety, COPD, hyperlipidemia, alcohol abuse admitted with falls and found to have left humeral fracture and significant alcohol withdrawal as well as electrolyte abnormalities.  Seen by orthopedic surgery and patient underwent a reverse shoulder arthroplasty on 2/2 .  following admission, patient found to have acute respiratory failure secondary to aspiration pneumonia.  She has been slowly improving.  Seen by PT and OT who recommended short-term skilled nursing, however patient is adamant about being discharged back to her apartment, despite the fact that she does not have family nearby and remains a high fall risk.  Patient states her breathing is getting a little bit better.  Some shoulder pain.  Assessment/Plan: Principal Problem:   Alcohol dependence with withdrawal [facility]): Improving with CIWA protocol.  Patient does not admit to how much she drinks, insisting she only drinks 2 glasses of wine at night.  Social worker talk to patient's daughter who states that patient is actually been drinking 2-3 bottles per day. Active Problems:   Closed left humeral fracture: Status post reverse shoulder arthroplasty.  Seen by PT and OT who recommended inpatient rehab versus skilled nursing.  Patient however is adamant about going back to her apartment.  I have assessed her, and she has no issues with hearing, seeing, speaking or comprehending information. There are no language barriers present.  There are currently no active medical issues which could affect capacity such as hypoxia, acute neurological or psychiatric illness, delirium or impairment from medications.  Despite being on Ativan, she is appropriate, alert and oriented x3  The patient shows understanding of their condition as well as the options (including  lack of treatment) for their condition. Furthermore, the patient understands the benefits & risks of treatment, including the chances the treatment will work & chances side effects or a bad outcome will occur.   Therefore, given that the patient meets all the above criteria, I feel that this patient does possess capacity for their own medical decision making.  However, I think that she is just making poor decisions on this although she does understand    Elevated LFTs   Hypokalemia: Replacing as needed   Macrocytic anemia: Secondary to alcohol abuse likely   Hypotension due to hypovolemia    Malnutrition of moderate degree: Seen by nutrition.  Meets criteria as evidenced by energy intake of less than 75% for more than 3 months along with mild fat and muscle depletion which is ongoing.  Placed on Ensure 3 times daily.  Acute respiratory failure with hypoxia secondary to aspiration pneumonia and underlying chronic COPD with ongoing tobacco abuse: Counseled about smoking.  On Augmentin.  Code Status: Full code  Family Communication: Left message for daughter  Disposition Plan: Discharge once off of oxygen   Consultants:  Orthopedics  Procedures:  Reverse left shoulder arthroplasty done 2/2  Antimicrobials:  Augmentin 2/6-present  DVT prophylaxis: Lovenox   Objective: Vitals:   08/18/18 1149 08/18/18 1654  BP: 120/65 116/67  Pulse: 86 89  Resp: (!) 21 (!) 23  Temp: 98.5 F (36.9 C) 97.7 F (36.5 C)  SpO2: 96%     Intake/Output Summary (Last 24 hours) at 08/18/2018 1714 Last data filed at 08/18/2018 0800 Gross per 24 hour  Intake 120 ml  Output 1700 ml  Net -1580 ml   Filed Weights   08/16/18 0408 08/17/18  0500 08/18/18 0500  Weight: 67.1 kg 68.2 kg 64 kg   Body mass index is 23.84 kg/m.  Exam:   General: Alert and oriented x3  HEENT: Normocephalic and atraumatic, mucous membranes slightly dry  Neck: Supple, no JVD  Cardiovascular: Regular rate and  rhythm, S1-S2  Respiratory: Decreased breath sounds throughout  Abdomen: Soft, nontender, nondistended, positive bowel sounds  Musculoskeletal: No clubbing or cyanosis, trace pitting edema.  Left arm in sling  Skin: Outside of surgical site, no skin breaks, tears or lesions  Psychiatry: Patient is appropriate, no evidence of acute psychoses   Data Reviewed: CBC: Recent Labs  Lab 08/12/18 1857 08/13/18 0221  08/14/18 0204 08/15/18 0500 08/15/18 1736 08/16/18 0520 08/17/18 0409 08/18/18 0543  WBC 12.8* 7.4   < > 7.6 9.6  --  6.0 5.7 3.4*  NEUTROABS 10.9* 5.8  --   --   --   --   --   --   --   HGB 11.0* 7.5*   < > 9.4* 10.5* 8.5* 9.2* 9.0* 10.4*  HCT 34.0* 24.1*   < > 30.2* 33.7* 25.0* 30.2* 28.4* 32.3*  MCV 110.4* 114.2*   < > 110.2* 109.1*  --  111.9* 110.9* 107.0*  PLT 238 167   < > 159 193  --  180 184 206   < > = values in this interval not displayed.   Basic Metabolic Panel: Recent Labs  Lab 08/14/18 0204 08/15/18 0500 08/15/18 1736 08/16/18 0520 08/17/18 0409 08/18/18 0543  NA 139 139 132* 140 139 137  K 4.3 4.2 3.5 3.9 3.8 3.5  CL 110 102  --  105 105 91*  CO2 20* 24  --  31 27 31   GLUCOSE 69* 119*  --  90 83 102*  BUN 7* 6*  --  6* <5* <5*  CREATININE 0.31* 0.49  --  0.37* 0.36* 0.45  CALCIUM 7.8* 8.4*  --  8.2* 8.0* 8.4*  MG 1.7 1.6*  --  1.6* 1.3* 1.3*  PHOS 2.3* 3.0  --   --   --   --    GFR: Estimated Creatinine Clearance: 58.6 mL/min (by C-G formula based on SCr of 0.45 mg/dL). Liver Function Tests: Recent Labs  Lab 08/14/18 0204 08/15/18 0500 08/16/18 0520 08/17/18 0409 08/18/18 0543  AST 60* 64* 83* 92* 68*  ALT 45* 46* 44 45* 44  ALKPHOS 71 74 68 76 82  BILITOT 0.9 1.0 1.0 1.1 1.4*  PROT 4.6* 5.2* 4.6* 4.4* 4.7*  ALBUMIN 2.5* 2.7* 2.3* 2.2* 2.2*   Recent Labs  Lab 08/12/18 1857  LIPASE 20   No results for input(s): AMMONIA in the last 168 hours. Coagulation Profile: Recent Labs  Lab 08/12/18 1857  INR 0.95   Cardiac  Enzymes: Recent Labs  Lab 08/12/18 1857  CKTOTAL 232   BNP (last 3 results) No results for input(s): PROBNP in the last 8760 hours. HbA1C: No results for input(s): HGBA1C in the last 72 hours. CBG: Recent Labs  Lab 08/15/18 0802 08/16/18 0826 08/16/18 1130 08/17/18 0752 08/18/18 0729  GLUCAP 82 64* 118* 83 93   Lipid Profile: No results for input(s): CHOL, HDL, LDLCALC, TRIG, CHOLHDL, LDLDIRECT in the last 72 hours. Thyroid Function Tests: No results for input(s): TSH, T4TOTAL, FREET4, T3FREE, THYROIDAB in the last 72 hours. Anemia Panel: No results for input(s): VITAMINB12, FOLATE, FERRITIN, TIBC, IRON, RETICCTPCT in the last 72 hours. Urine analysis:    Component Value Date/Time   COLORURINE YELLOW 08/12/2018  [place] 08/12/2018 1857   LABSPEC 1.019 08/12/2018 1857   PHURINE 5.0 08/12/2018 1857   GLUCOSEU NEGATIVE 08/12/2018 [place] NEGATIVE 08/12/2018 1857   BILIRUBINUR NEGATIVE 08/12/2018 1857   KETONESUR 80 (A) 08/12/2018 1857   PROTEINUR 30 (A) 08/12/2018 1857   NITRITE NEGATIVE 08/12/2018 1857   LEUKOCYTESUR NEGATIVE 08/12/2018 1857   Sepsis Labs: @LABRCNTIP (procalcitonin:4,lacticidven:4)  ) Recent Results (from the past 240 hour(s))  Blood Culture (routine x 2)     Status: None   Collection Time: 08/12/18 11:40 PM  Result Value Ref Range Status   Specimen Description BLOOD RIGHT ANTECUBITAL  Final   Special Requests   Final    BOTTLES DRAWN AEROBIC AND ANAEROBIC Blood Culture results may not be optimal due to an excessive volume of blood received in culture bottles   Culture   Final    NO GROWTH 5 DAYS Performed at [facility], [place] [address] [place], [place] [address]    Report Status 08/18/2018 FINAL  Final  Blood Culture (routine x 2)     Status: None   Collection Time: 08/12/18 11:56 PM  Result Value Ref Range Status   Specimen Description BLOOD RIGHT ARM  Final   Special Requests   Final    BOTTLES DRAWN AEROBIC  ONLY Blood Culture results may not be optimal due to an excessive volume of blood received in culture bottles   Culture   Final    NO GROWTH 5 DAYS Performed at [facility], [place] [address], [place], [place] [address]    Report Status 08/18/2018 FINAL  Final  MRSA PCR Screening     Status: None   Collection Time: 08/13/18  5:38 AM  Result Value Ref Range Status   MRSA by PCR NEGATIVE NEGATIVE Final    Comment:        The GeneXpert MRSA Assay (FDA approved for NASAL specimens only), is one component of a comprehensive MRSA colonization surveillance program. It is not intended to diagnose MRSA infection nor to guide or monitor treatment for MRSA infections. Performed at [facility], [place] [address] [place], [place] [address]       Studies: No results found.  Scheduled Meds: . amoxicillin-clavulanate  1 tablet Oral Q12H  . atorvastatin  10 mg Oral q1800  . busPIRone  10 mg Oral TID  . docusate sodium  100 mg Oral BID  . escitalopram  10 mg Oral Daily  . feeding supplement (ENSURE ENLIVE)  237 mL Oral TID BM  . folic acid  1 mg Oral Daily  . lidocaine  1 patch Transdermal Q24H  . mouth rinse  15 mL Mouth Rinse BID  . methylPREDNISolone (SOLU-MEDROL) injection  40 mg Intravenous Q8H  . metoprolol tartrate  10 mg Intravenous Q6H  . sodium chloride flush  3 mL Intravenous Q12H  . thiamine  100 mg Oral Daily    Continuous Infusions:   LOS: 5 days     Annita Brod, MD [facility]  To reach me or the doctor on call, go to: [email or web address] Password [email or web address]  08/18/2018, 5:14 PM

## 2018-08-18 NOTE — Progress Notes (Signed)
Occupational Therapy Treatment Patient Details Name: [name] MRN: [id number] DOB: October 27, 1950 Today's Date: 08/18/2018    History of present illness Pt is a 68 y.o. female with medical history significant for hypertension, anxiety, cancer of the right lung status post chemotherapy, now presenting to the emergency department after she was found down on the floor, reporting that she had fallen on 08/10/2018 and was unable to get up.  S/p L reverse total shoulder arthroplasty on 08/14/18  after finding L displaced proximal humerus fracture.  CIWA protocol.     OT comments  Pt with slow progression towards OT goals. Assisted with transfer back to bed from recliner during session. Pt continues to require two person assist for safe transfer completion at this time; continues to have limitations due to pain, impaired cognition, and general fatigue/weakness. Pt required consistent cueing during session to maintain LUE precautions; continues to require overall maxA for all aspect of ADL. Feel SNF recommendation is most appropriate at this time. Attempted to have conversation with pt re: need for post acute rehab, pt verbalizing she will think about it. Will continue to follow acutely to progress pt towards established OT goals.   Follow Up Recommendations  SNF;Supervision/Assistance - 24 hour    Equipment Recommendations  3 in 1 bedside commode          Precautions / Restrictions Precautions Precautions: Shoulder;Fall Type of Shoulder Precautions: no shoulder ROM, active ROM elbow/wrist/hand  Shoulder Interventions: Shoulder sling/immobilizer;At all times;Off for dressing/bathing/exercises Precaution Comments: education on NWB and sling use at all times; emphasized no ROM to shoulder Required Braces or Orthoses: Sling Restrictions Weight Bearing Restrictions: Yes LUE Weight Bearing: Non weight bearing       Mobility Bed Mobility Overal bed mobility: Needs Assistance Bed Mobility: Sit to  Supine     Supine to sit: Mod assist     General bed mobility comments: assist for LEs with pt use of R handrail, initially requires cues not to push through LUE when pt attempting to scoot back in bed  Transfers Overall transfer level: Needs assistance Equipment used: 1 person hand held assist;2 person hand held assist Transfers: Sit to/from [facility] Sit to Stand: Mod assist;+2 safety/equipment;+2 physical assistance Stand pivot transfers: Mod assist;+2 safety/equipment;+2 physical assistance       General transfer comment: Mod A +2 for transfers with cueing for safety and sequencing; flexed trunk throughout; pt taking very small pivotal steps during transfer    Balance Overall balance assessment: Needs assistance Sitting-balance support: Single extremity supported;Feet supported Sitting balance-Leahy Scale: Fair     Standing balance support: Single extremity supported;During functional activity Standing balance-Leahy Scale: Poor Standing balance comment: reliant on UE and external support                           ADL either performed or assessed with clinical judgement   ADL Overall ADL's : Needs assistance/impaired     Grooming: Wash/dry face;Set up;Sitting           Upper Body Dressing : Maximal assistance;Sitting;Cueing for compensatory techniques;Cueing for UE precautions Upper Body Dressing Details (indicate cue type and reason): maxA to adjust sling this session                 Functional mobility during ADLs: Maximal assistance General ADL Comments: patient continues to have limitations due to pain, impaired cognition and decreased balance; requires cues to maintain LUE precautions; attempted to have conversation  about need for SNF rehab at time of discharge, pt not very open to conversation or idea but is agreeable to consider it      Vision       Perception     Praxis      Cognition Arousal/Alertness:  Awake/alert Behavior During Therapy: Anxious Overall Cognitive Status: Impaired/Different from baseline Area of Impairment: Following commands;Safety/judgement;Problem solving                     Memory: Decreased short-term memory;Decreased recall of precautions Following Commands: Follows one step commands consistently;Follows one step commands with increased time Safety/Judgement: Decreased awareness of safety;Decreased awareness of deficits   Problem Solving: Slow processing;Decreased initiation;Difficulty sequencing;Requires verbal cues;Requires tactile cues General Comments: pateint requiring cueing for safety and sequencing throughout session; requires cues to maintain NWB and no ROM to LUE         Exercises Exercises: Hand exercises Shoulder Exercises Digit Composite Flexion: AROM;Left;10 reps;Seated Composite Extension: AROM;Left;10 reps;Seated   Shoulder Instructions Shoulder Instructions Donning/doffing shirt without moving shoulder: Maximal assistance Method for sponge bathing under operated UE: Maximal assistance Donning/doffing sling/immobilizer: Maximal assistance Correct positioning of sling/immobilizer: Maximal assistance Sling wearing schedule (on at all times/off for ADL's): Maximal assistance Positioning of UE while sleeping: Maximal assistance     General Comments VSS    Pertinent Vitals/ Pain       Pain Assessment: Faces Faces Pain Scale: Hurts whole lot Pain Location: L UE, abdomen/back Pain Descriptors / Indicators: Discomfort;Operative site guarding;Guarding;Grimacing Pain Intervention(s): Limited activity within patient's tolerance;Monitored during session;Repositioned  Home Living                                          Prior Functioning/Environment              Frequency  Min 3X/week        Progress Toward Goals  OT Goals(current goals can now be found in the care plan section)  Progress towards OT goals:  Progressing toward goals  Acute Rehab OT Goals Patient Stated Goal: to go home OT Goal Formulation: With patient Time For Goal Achievement: 08/30/18 Potential to Achieve Goals: Good ADL Goals Pt Will Perform Grooming: with modified independence;sitting Pt Will Perform Upper Body Bathing: with min assist;sitting Pt Will Perform Lower Body Dressing: with supervision;sit to/from stand Pt Will Transfer to Toilet: with supervision;ambulating Pt/caregiver will Perform Home Exercise Program: Increased ROM;With written HEP provided;Left upper extremity Additional ADL Goal #1: Pt will manage sling with independence or verbalize steps to instruct caregiver with independnece.  Plan Discharge plan needs to be updated    Co-evaluation                 AM-PAC OT "6 Clicks" Daily Activity     Outcome Measure   Help from another person eating meals?: A Little Help from another person taking care of personal grooming?: A Little Help from another person toileting, which includes using toliet, bedpan, or urinal?: Total Help from another person bathing (including washing, rinsing, drying)?: A Lot Help from another person to put on and taking off regular upper body clothing?: A Lot Help from another person to put on and taking off regular lower body clothing?: Total 6 Click Score: 12    End of Session Equipment Utilized During Treatment: Gait belt;Other (comment)(sling)  OT Visit Diagnosis: Unsteadiness on feet (R26.81);Other abnormalities of gait  and mobility (R26.89);Muscle weakness (generalized) (M62.81);Pain;History of falling (Z91.81) Pain - Right/Left: Left Pain - part of body: Shoulder;Arm   Activity Tolerance Patient limited by pain   Patient Left in bed;with call bell/phone within reach;with bed alarm set   Nurse Communication Mobility status        Time: 3235-5732 OT Time Calculation (min): 20 min  Charges: OT General Charges $OT Visit: 1 Visit OT Treatments $Self  Care/Home Management : 8-22 mins  Lou Cal, [place] Pager [phone] Office [place] 08/18/2018, 3:45 PM

## 2018-08-18 NOTE — Social Work (Addendum)
1:45pm- CSW spoke with pt and pt step daughter [name] in pt room. CSW reintroduced self, role, and reason for visit. Pt amenable to speaking with CSW. We discussed visit yesterday, and recommendations with therapy. Pt very avoidant to speaking with CSW, she kept head turned during visit and avoided eye contact. She did not eat lunch tray but did order grilled cheese for dinner. Refused all other food offers. We discussed home health vs SNF and the reasons for recommendation of SNF. We discussed that pt lacked 24/7 assistance at home and there is concern for discharge back home without assistance to perform daily tasks. Pt visibly upset, repeated multiple times that she is going home. Pt states I am making her frustrated and at this time CSW let pt know that I would give her time to talk with her step daughter and pt MD Provided pt with bed offers and provided an overview of SNF process to pt and pt step daughter.    Pt step daughter with multiple questions regarding current care and prognosis/healing times. CSW will page MD. OT was entering room as CSW exited.  9:57am- CSW received call from pt step daughter she is in [place], coming to hospital. Would like to speak with staff, have arranged to meet pt step daughter in the room at [place], MSW, [place] [employer] [phone]

## 2018-08-18 NOTE — Progress Notes (Signed)
Physical Therapy Treatment Patient Details Name: [name] MRN: [id number] DOB: 03/24/1951 Today's Date: 08/18/2018    History of Present Illness Pt is a 68 y.o. female with medical history significant for hypertension, anxiety, cancer of the right lung status post chemotherapy, now presenting to the emergency department after she was found down on the floor, reporting that she had fallen on 08/10/2018 and was unable to get up.  S/p L reverse total shoulder arthroplasty on 08/14/18  after finding L displaced proximal humerus fracture.  CIWA protocol.      PT Comments    Patient seen for mobility progression. Patient received in bed with soiled bed linens with BM requiring assist for bed mobility, transfers and peri care. Patient performing bed level mobility and transfers with Mod A +2 for safety and stability as well as to maintain upright standing balance. PT providing cueing throughout session for posturing, balance, and overall safety and sequencing to complete each task successfully. Further education on use of sling as well as NWB at L UE. Will continue to progress towards goals.     Follow Up Recommendations  SNF     Equipment Recommendations  Other (comment)(defer)    Recommendations for Other Services       Precautions / Restrictions Precautions Precautions: Shoulder;Fall Type of Shoulder Precautions: no shoulder ROM, active ROM elbow/wrist/hand  Shoulder Interventions: Shoulder sling/immobilizer;At all times;Off for dressing/bathing/exercises Precaution Comments: education on NWB and sling use at all times Required Braces or Orthoses: Sling Restrictions Weight Bearing Restrictions: Yes LUE Weight Bearing: Non weight bearing    Mobility  Bed Mobility Overal bed mobility: Needs Assistance Bed Mobility: Supine to Sit     Supine to sit: Mod assist     General bed mobility comments: Mod A - patient initially not understanding that she can not use L UE to push/pul to  assist with bed mobility  Transfers Overall transfer level: Needs assistance Equipment used: 1 person hand held assist;2 person hand held assist Transfers: Sit to/from [facility] Sit to Stand: Mod assist;+2 safety/equipment Stand pivot transfers: Mod assist;+2 safety/equipment       General transfer comment: Mod A +2 for transfers with cueing for safety and sequencing; flexed trunk throughout  Ambulation/Gait             General Gait Details: deferred   Stairs             Wheelchair Mobility    Modified Rankin (Stroke Patients Only)       Balance Overall balance assessment: Needs assistance Sitting-balance support: Single extremity supported;Feet supported Sitting balance-Leahy Scale: Fair     Standing balance support: Single extremity supported;During functional activity Standing balance-Leahy Scale: Poor Standing balance comment: reliant on UE and external support                            Cognition Arousal/Alertness: Awake/alert Behavior During Therapy: Anxious Overall Cognitive Status: Impaired/Different from baseline Area of Impairment: Following commands;Safety/judgement;Problem solving                     Memory: Decreased short-term memory;Decreased recall of precautions Following Commands: Follows one step commands consistently;Follows one step commands with increased time Safety/Judgement: Decreased awareness of safety;Decreased awareness of deficits   Problem Solving: Slow processing;Decreased initiation;Difficulty sequencing;Requires verbal cues;Requires tactile cues General Comments: pateint requiring cueing for safety and sequencing throughout session; patient very focused on getting nausea meds  Exercises      General Comments        Pertinent Vitals/Pain Pain Assessment: Faces Faces Pain Scale: Hurts whole lot Pain Location: L UE, abdomen/back Pain Descriptors / Indicators:  Discomfort;Operative site guarding;Guarding;Grimacing Pain Intervention(s): Limited activity within patient's tolerance;Monitored during session;Repositioned    Home Living                      Prior Function            PT Goals (current goals can now be found in the care plan section) Acute Rehab PT Goals Patient Stated Goal: to go home PT Goal Formulation: With patient Time For Goal Achievement: 08/30/18 Potential to Achieve Goals: Fair Progress towards PT goals: Progressing toward goals    Frequency    Min 3X/week      PT Plan Current plan remains appropriate    Co-evaluation              AM-PAC PT "6 Clicks" Mobility   Outcome Measure  Help needed turning from your back to your side while in a flat bed without using bedrails?: A Lot Help needed moving from lying on your back to sitting on the side of a flat bed without using bedrails?: A Lot Help needed moving to and from a bed to a chair (including a wheelchair)?: A Lot Help needed standing up from a chair using your arms (e.g., wheelchair or bedside chair)?: A Lot Help needed to walk in hospital room?: A Lot Help needed climbing 3-5 steps with a railing? : Total 6 Click Score: 11    End of Session Equipment Utilized During Treatment: Gait belt;Oxygen Activity Tolerance: Patient tolerated treatment well;Treatment limited secondary to medical complications (Comment)(reports of nausea throughout) Patient left: in chair;with call bell/phone within reach;with chair alarm set;with nursing/sitter in room Nurse Communication: Mobility status PT Visit Diagnosis: Other abnormalities of gait and mobility (R26.89);Muscle weakness (generalized) (M62.81);History of falling (Z91.81)     Time: 0379-4446 PT Time Calculation (min) (ACUTE ONLY): 26 min  Charges:  $Therapeutic Activity: 23-37 mins                      Lanney Gins, PT, DPT Supplemental Physical Therapist 08/18/18 1:46 PM Pager:  [phone] Office: [phone]

## 2018-08-19 LAB — GLUCOSE, CAPILLARY: Glucose-Capillary: 98 mg/dL (ref 70–99)

## 2018-08-19 MED ORDER — METHYLPREDNISOLONE SODIUM SUCC 40 MG IJ SOLR
40.0000 mg | Freq: Two times a day (BID) | INTRAMUSCULAR | Status: AC
  Administered 2018-08-19 – 2018-08-21 (×5): 40 mg via INTRAVENOUS
  Filled 2018-08-19 (×5): qty 1

## 2018-08-19 MED ORDER — MAGNESIUM SULFATE 4 GM/100ML IV SOLN
4.0000 g | Freq: Once | INTRAVENOUS | Status: AC
  Administered 2018-08-19: 4 g via INTRAVENOUS
  Filled 2018-08-19: qty 100

## 2018-08-19 NOTE — Progress Notes (Signed)
PROGRESS NOTE    [name]  RJJ:[id number] DOB: Nov 21, 1950 DOA: 08/12/2018 PCP: Wenda Low, MD   Brief Narrative:68 year old with past medical history relevant for depression/anxiety, COPD, hyperlipidemia, alcohol abuse admitted with falls and found to have left humeral fracture and significant alcohol withdrawal as well as electrolyte abnormalities.  Seen by orthopedic surgery and patient underwent a reverse shoulder arthroplasty on 2/2 .  following admission, patient found to have acute respiratory failure secondary to aspiration pneumonia.  She has been slowly improving.  Seen by PT and OT who recommended short-term skilled nursing, however patient is adamant about being discharged back to her apartment, despite the fact that she does not have family nearby and remains a high fall risk.  Patient states her breathing is getting a little bit better.  Some shoulder pain.  Assessment & Plan:   Principal Problem:   Alcohol dependence with withdrawal (HCC) Active Problems:   COPD pfts pending/ active smoker   Closed left humeral fracture   Elevated LFTs   Hypokalemia   Macrocytic anemia   Hypotension due to hypovolemia   Closed fracture of left humerus   Malnutrition of moderate degree   H/O total shoulder replacement, left     Alcohol dependence with withdrawal [facility]): Improving with CIWA protocol.  Patient does not admit to how much she drinks, insisting she only drinks 2 glasses of wine at night.  Social worker talk to patient's daughter who states that patient is actually been drinking 2-3 bottles per day.  Active Problems:   Closed left humeral fracture: Status post reverse shoulder arthroplasty.  Seen by PT and OT who recommended inpatient rehab versus skilled nursing.  Patient however is adamant about going back to her apartment.  I have assessed her, and she has no issues with hearing, seeing, speaking or comprehending information. There are no language barriers present.   There are currently no active medical issues which could affect capacity such as hypoxia, acute neurological or psychiatric illness, delirium or impairment from medications.  Despite being on Ativan, she is appropriate, alert and oriented x3.  Today she reports she that her stepdaughter will be in and out of her apartment watcher.  The patient shows understanding of their condition as well as the options (including lack of treatment) for their condition. Furthermore, the patient understands the benefits & risks of treatment, including the chances the treatment will work & chances side effects or a bad outcome will occur.   Therefore, given that the patient meets all the above criteria, I feel that this patient does possess capacity for their own medical decision making.  However, I think that she is just making poor decisions on this although she does understand     Hypokalemia: Replacing as needed   Macrocytic anemia: Secondary to alcohol abuse likely   Hypotension due to hypovolemia    Malnutrition of moderate degree: Seen by nutrition.  Meets criteria as evidenced by energy intake of less than 75% for more than 3 months along with mild fat and muscle depletion which is ongoing.  Placed on Ensure 3 times daily.  Acute respiratory failure with hypoxia secondary to aspiration pneumonia and underlying chronic COPD with ongoing tobacco abuse: Counseled about smoking.  On Augmentin.  X-ray 2/ 6 shows findings consistent with a left lower lobe pneumonia.  Taper Solu-Medrol add incentive spirometry.  Augmentin was started 08/17/2018.    Nutrition Problem: Moderate Malnutrition Etiology: social / environmental circumstances     Signs/Symptoms: energy intake <  75% for > or equal to 3 months, mild fat depletion, mild muscle depletion    Interventions: Ensure Enlive (each supplement provides 350kcal and 20 grams of protein)  Estimated body mass index is 23.84 kg/m as calculated from the  following:   Height as of this encounter: 5' 4.5" (1.638 m).   Weight as of this encounter: 64 kg.  DVT prophylaxis: Lovenox Code Status full code Family Communication: None Disposition Plan: .  Patient is still on 4 L of oxygen   Consultants: Ortho  Procedures: Left shoulder arthroplasty Antimicrobials: Augmentin  Subjective: Resting in bed awake alert in no acute distress still reports she does not want to go to a rehab she rather go to home with his home PT.  Objective: Vitals:   08/19/18 0300 08/19/18 0400 08/19/18 0726 08/19/18 0729  BP: 128/76 (!) 145/72 136/70   Pulse: 72 65 61   Resp: 20 16 15    Temp: 97.7 F (36.5 C)   97.9 F (36.6 C)  TempSrc: Oral   Oral  SpO2: 99% 99% 100%   Weight:      Height:        Intake/Output Summary (Last 24 hours) at 08/19/2018 1131 Last data filed at 08/18/2018 2139 Gross per 24 hour  Intake 240 ml  Output 300 ml  Net -60 ml   Filed Weights   08/16/18 0408 08/17/18 0500 08/18/18 0500  Weight: 67.1 kg 68.2 kg 64 kg    Examination:  General exam: Appears calm and comfortable  Respiratory system: Scattered rhonchi to auscultation. Respiratory effort normal. Cardiovascular system: S1 & S2 heard, RRR. No JVD, murmurs, rubs, gallops or clicks. No pedal edema. Gastrointestinal system: Abdomen is nondistended, soft and nontender. No organomegaly or masses felt. Normal bowel sounds heard. Central nervous system: Alert and oriented. No focal neurological deficits. Extremities: Symmetric 5 x 5 power. Skin: No rashes, lesions or ulcers Psychiatry: Judgement and insight appear normal. Mood & affect appropriate.     Data Reviewed: I have personally reviewed following labs and imaging studies  CBC: Recent Labs  Lab 08/12/18 1857 08/13/18 0221  08/14/18 0204 08/15/18 0500 08/15/18 1736 08/16/18 0520 08/17/18 0409 08/18/18 0543  WBC 12.8* 7.4   < > 7.6 9.6  --  6.0 5.7 3.4*  NEUTROABS 10.9* 5.8  --   --   --   --   --   --   --    HGB 11.0* 7.5*   < > 9.4* 10.5* 8.5* 9.2* 9.0* 10.4*  HCT 34.0* 24.1*   < > 30.2* 33.7* 25.0* 30.2* 28.4* 32.3*  MCV 110.4* 114.2*   < > 110.2* 109.1*  --  111.9* 110.9* 107.0*  PLT 238 167   < > 159 193  --  180 184 206   < > = values in this interval not displayed.   Basic Metabolic Panel: Recent Labs  Lab 08/14/18 0204 08/15/18 0500 08/15/18 1736 08/16/18 0520 08/17/18 0409 08/18/18 0543  NA 139 139 132* 140 139 137  K 4.3 4.2 3.5 3.9 3.8 3.5  CL 110 102  --  105 105 91*  CO2 20* 24  --  31 27 31   GLUCOSE 69* 119*  --  90 83 102*  BUN 7* 6*  --  6* <5* <5*  CREATININE 0.31* 0.49  --  0.37* 0.36* 0.45  CALCIUM 7.8* 8.4*  --  8.2* 8.0* 8.4*  MG 1.7 1.6*  --  1.6* 1.3* 1.3*  PHOS 2.3* 3.0  --   --   --   --  GFR: Estimated Creatinine Clearance: 58.6 mL/min (by C-G formula based on SCr of 0.45 mg/dL). Liver Function Tests: Recent Labs  Lab 08/14/18 0204 08/15/18 0500 08/16/18 0520 08/17/18 0409 08/18/18 0543  AST 60* 64* 83* 92* 68*  ALT 45* 46* 44 45* 44  ALKPHOS 71 74 68 76 82  BILITOT 0.9 1.0 1.0 1.1 1.4*  PROT 4.6* 5.2* 4.6* 4.4* 4.7*  ALBUMIN 2.5* 2.7* 2.3* 2.2* 2.2*   Recent Labs  Lab 08/12/18 1857  LIPASE 20   No results for input(s): AMMONIA in the last 168 hours. Coagulation Profile: Recent Labs  Lab 08/12/18 1857  INR 0.95   Cardiac Enzymes: Recent Labs  Lab 08/12/18 1857  CKTOTAL 232   BNP (last 3 results) No results for input(s): PROBNP in the last 8760 hours. HbA1C: No results for input(s): HGBA1C in the last 72 hours. CBG: Recent Labs  Lab 08/16/18 0826 08/16/18 1130 08/17/18 0752 08/18/18 0729 08/19/18 0727  GLUCAP 64* 118* 83 93 98   Lipid Profile: No results for input(s): CHOL, HDL, LDLCALC, TRIG, CHOLHDL, LDLDIRECT in the last 72 hours. Thyroid Function Tests: No results for input(s): TSH, T4TOTAL, FREET4, T3FREE, THYROIDAB in the last 72 hours. Anemia Panel: No results for input(s): VITAMINB12, FOLATE, FERRITIN,  TIBC, IRON, RETICCTPCT in the last 72 hours. Sepsis Labs: Recent Labs  Lab 08/12/18 2315  LATICACIDVEN 0.9    Recent Results (from the past 240 hour(s))  Blood Culture (routine x 2)     Status: None   Collection Time: 08/12/18 11:40 PM  Result Value Ref Range Status   Specimen Description BLOOD RIGHT ANTECUBITAL  Final   Special Requests   Final    BOTTLES DRAWN AEROBIC AND ANAEROBIC Blood Culture results may not be optimal due to an excessive volume of blood received in culture bottles   Culture   Final    NO GROWTH 5 DAYS Performed at [facility], [place] [address], [place], [place] [address]    Report Status 08/18/2018 FINAL  Final  Blood Culture (routine x 2)     Status: None   Collection Time: 08/12/18 11:56 PM  Result Value Ref Range Status   Specimen Description BLOOD RIGHT ARM  Final   Special Requests   Final    BOTTLES DRAWN AEROBIC ONLY Blood Culture results may not be optimal due to an excessive volume of blood received in culture bottles   Culture   Final    NO GROWTH 5 DAYS Performed at [facility], [place] [address] [place], [place] [address]    Report Status 08/18/2018 FINAL  Final  MRSA PCR Screening     Status: None   Collection Time: 08/13/18  5:38 AM  Result Value Ref Range Status   MRSA by PCR NEGATIVE NEGATIVE Final    Comment:        The GeneXpert MRSA Assay (FDA approved for NASAL specimens only), is one component of a comprehensive MRSA colonization surveillance program. It is not intended to diagnose MRSA infection nor to guide or monitor treatment for MRSA infections. Performed at [facility], [place] [address] [place],  [address]          Radiology Studies: No results found.      Scheduled Meds: . amoxicillin-clavulanate  1 tablet Oral Q12H  . atorvastatin  10 mg Oral q1800  . busPIRone  10 mg Oral TID  . docusate sodium  100 mg Oral BID  . escitalopram  10 mg  Oral Daily  . feeding supplement (ENSURE  ENLIVE)  237 mL Oral TID BM  . folic acid  1 mg Oral Daily  . lidocaine  1 patch Transdermal Q24H  . mouth rinse  15 mL Mouth Rinse BID  . methylPREDNISolone (SOLU-MEDROL) injection  40 mg Intravenous Q8H  . metoprolol tartrate  10 mg Intravenous Q6H  . sodium chloride flush  3 mL Intravenous Q12H  . thiamine  100 mg Oral Daily   Continuous Infusions: . magnesium sulfate 1 - 4 g bolus IVPB       LOS: 6 days     Georgette Shell, MD [facility]  If 7PM-7AM, please contact night-coverage [email or web address] Password [email or web address] 08/19/2018, 11:31 AM

## 2018-08-20 LAB — COMPREHENSIVE METABOLIC PANEL
ALT: 78 U/L — ABNORMAL HIGH (ref 0–44)
ANION GAP: 9 (ref 5–15)
AST: 123 U/L — ABNORMAL HIGH (ref 15–41)
Albumin: 2.2 g/dL — ABNORMAL LOW (ref 3.5–5.0)
Alkaline Phosphatase: 81 U/L (ref 38–126)
BUN: 9 mg/dL (ref 8–23)
CALCIUM: 8.2 mg/dL — AB (ref 8.9–10.3)
CO2: 36 mmol/L — ABNORMAL HIGH (ref 22–32)
Chloride: 91 mmol/L — ABNORMAL LOW (ref 98–111)
Creatinine, Ser: 0.33 mg/dL — ABNORMAL LOW (ref 0.44–1.00)
GFR calc Af Amer: >60 mL/min (ref >60)
GFR calc non Af Amer: >60 mL/min (ref >60)
Glucose, Bld: 141 mg/dL — ABNORMAL HIGH (ref 70–99)
Potassium: 3.2 mmol/L — ABNORMAL LOW (ref 3.5–5.1)
Sodium: 136 mmol/L (ref 135–145)
Total Bilirubin: 0.9 mg/dL (ref 0.3–1.2)
Total Protein: 4.5 g/dL — ABNORMAL LOW (ref 6.5–8.1)

## 2018-08-20 LAB — CBC
HCT: 32.1 % — ABNORMAL LOW (ref 36.0–46.0)
Hemoglobin: 10.2 g/dL — ABNORMAL LOW (ref 12.0–15.0)
MCH: 33.3 pg (ref 26.0–34.0)
MCHC: 31.8 g/dL (ref 30.0–36.0)
MCV: 104.9 fL — ABNORMAL HIGH (ref 80.0–100.0)
Platelets: 240 10*3/uL (ref 150–400)
RBC: 3.06 MIL/uL — ABNORMAL LOW (ref 3.87–5.11)
RDW: 13.1 % (ref 11.5–15.5)
WBC: 4.6 10*3/uL (ref 4.0–10.5)
nRBC: 0 % (ref 0.0–0.2)

## 2018-08-20 LAB — MAGNESIUM: Magnesium: 1.6 mg/dL — ABNORMAL LOW (ref 1.7–2.4)

## 2018-08-20 LAB — GLUCOSE, CAPILLARY: Glucose-Capillary: 99 mg/dL (ref 70–99)

## 2018-08-20 MED ORDER — MAGNESIUM SULFATE 4 GM/100ML IV SOLN
4.0000 g | Freq: Once | INTRAVENOUS | Status: AC
  Administered 2018-08-20: 4 g via INTRAVENOUS
  Filled 2018-08-20: qty 100

## 2018-08-20 MED ORDER — POTASSIUM CHLORIDE CRYS ER 20 MEQ PO TBCR
40.0000 meq | EXTENDED_RELEASE_TABLET | Freq: Once | ORAL | Status: AC
  Administered 2018-08-20: 40 meq via ORAL
  Filled 2018-08-20: qty 2

## 2018-08-20 NOTE — Progress Notes (Signed)
PROGRESS NOTE    [name]  PTW:[id number] DOB: 1951/02/09 DOA: 08/12/2018 PCP: Wenda Low, MD   Brief Narrative:68 year old with past medical history relevant for depression/anxiety, COPD, hyperlipidemia, alcohol abuse admitted with falls and found to have left humeral fracture and significant alcohol withdrawal as well as electrolyte abnormalities.Seen by orthopedic surgery and patient underwent a reverse shoulder arthroplasty on 2/2.following admission, patient found to have acute respiratory failure secondary to aspiration pneumonia. She has been slowly improving. Seen by PT and OT who recommended short-term skilled nursing, however patient is adamant about being discharged back to her apartment, despite the fact that she does not have family nearby and remains a high fall risk.  Patient states her breathing is getting a little bit better. Some shoulder pain.  Assessment & Plan:   Principal Problem:   Alcohol dependence with withdrawal (HCC) Active Problems:   COPD pfts pending/ active smoker   Closed left humeral fracture   Elevated LFTs   Hypokalemia   Macrocytic anemia   Hypotension due to hypovolemia   Closed fracture of left humerus   Malnutrition of moderate degree   H/O total shoulder replacement, left   Alcohol dependence with withdrawal [facility]): Improving with CIWA protocol. Patient does not admit to how much she drinks, insisting she only drinks 2 glasses of wine at night. Social worker talk to patient's daughter who states that patient is actually been drinking 2-3 bottles per day.  Active Problems: Closed left humeral fracture: Status post reverse shoulder arthroplasty. Seen by PT and OT who recommended inpatient rehab versus skilled nursing. Patient however is adamant about going back to her apartment. I have assessed her, and shehas no issues with hearing, seeing, speaking or comprehending information. There are no language barriers present.  There are currently no active medical issues which could affect capacity such as hypoxia, acute neurological or psychiatric illness, delirium or impairment from medications. Despite being on Ativan, she is appropriate, alert and oriented x3.  Today she reports she that her stepdaughter will be in and out of her apartment watcher.  The patient shows understanding of their condition as well as the options (including lack of treatment) for their condition. Furthermore, the patient understands the benefits &risks of treatment, including the chances the treatment will work &chances side effects or a bad outcome will occur.      Therefore, given that the patient meets all the above criteria, I feel that this patient does possess capacity for their own medical decision making.However, I think that she is just making poor decisions on this although she does understand   Hypokalemia: Replacing as needed Macrocytic anemia:Secondary to alcohol abuse likely Hypotension due to hypovolemia  Malnutrition of moderate degree: Seen by nutrition. Meets criteria as evidenced by energy intake of less than 75% for more than 3 months along with mild fat and muscle depletion which is ongoing. Placed on Ensure 3 times daily.  Acute respiratory failure with hypoxia secondary to aspiration pneumonia and underlying chronic COPD with ongoing tobacco abuse: Counseled about smoking. On Augmentin.  X-ray 2/ 6 shows findings consistent with a left lower lobe pneumonia.  Taper Solu-Medrol add incentive spirometry.  Augmentin was started 08/17/2018.  Titrate oxygen to keep her saturation above 90%.  Currently on 3 L of oxygen.     Nutrition Problem: Moderate Malnutrition Etiology: social / environmental circumstances     Signs/Symptoms: energy intake < 75% for > or equal to 3 months, mild fat depletion, mild muscle depletion  Interventions: Ensure Enlive (each supplement provides 350kcal and 20  grams of protein)  Estimated body mass index is 23.84 kg/m as calculated from the following:   Height as of this encounter: 5' 4.5" (1.638 m).   Weight as of this encounter: 64 kg.  DVT prophylaxis: Lovenox Code Status: Full code Family Communication: None available Disposition Plan: Patient may be able to be discharged early next week if his saturation improves or she may have to go home with the oxygen  Consultants:  None Procedures: None Antimicrobials: Augmentin  Subjective: Patient resting in bed appears in no acute distress denies any new complaints   Objective: Vitals:   08/19/18 2300 08/20/18 0000 08/20/18 0400 08/20/18 0800  BP: 99/61 (!) 158/119 139/76 (!) 129/97  Pulse: 75 64 (!) 59 62  Resp: 15 12 14 14   Temp: (!) 97.5 F (36.4 C)  98 F (36.7 C) 98.4 F (36.9 C)  TempSrc: Oral  Oral   SpO2: 96% 98% 100% 100%  Weight:      Height:        Intake/Output Summary (Last 24 hours) at 08/20/2018 1209 Last data filed at 08/20/2018 0934 Gross per 24 hour  Intake 243 ml  Output 600 ml  Net -357 ml   Filed Weights   08/16/18 0408 08/17/18 0500 08/18/18 0500  Weight: 67.1 kg 68.2 kg 64 kg    Examination:  General exam: Appears calm and comfortable  Respiratory system: scattered rhonchi to auscultation. Respiratory effort normal. Cardiovascular system: S1 & S2 heard, RRR. No JVD, murmurs, rubs, gallops or clicks. No pedal edema. Gastrointestinal system: Abdomen is nondistended, soft and nontender. No organomegaly or masses felt. Normal bowel sounds heard. Central nervous system: Alert and oriented. No focal neurological deficits. Extremities: trace edema Skin: No rashes, lesions or ulcers Psychiatry: Judgement and insight appear normal. Mood & affect appropriate.     Data Reviewed: I have personally reviewed following labs and imaging studies  CBC: Recent Labs  Lab 08/15/18 0500 08/15/18 1736 08/16/18 0520 08/17/18 0409 08/18/18 0543 08/20/18 0300    WBC 9.6  --  6.0 5.7 3.4* 4.6  HGB 10.5* 8.5* 9.2* 9.0* 10.4* 10.2*  HCT 33.7* 25.0* 30.2* 28.4* 32.3* 32.1*  MCV 109.1*  --  111.9* 110.9* 107.0* 104.9*  PLT 193  --  180 184 206 233   Basic Metabolic Panel: Recent Labs  Lab 08/14/18 0204 08/15/18 0500 08/15/18 1736 08/16/18 0520 08/17/18 0409 08/18/18 0543 08/20/18 0300  NA 139 139 132* 140 139 137 136  K 4.3 4.2 3.5 3.9 3.8 3.5 3.2*  CL 110 102  --  105 105 91* 91*  CO2 20* 24  --  31 27 31  36*  GLUCOSE 69* 119*  --  90 83 102* 141*  BUN 7* 6*  --  6* <5* <5* 9  CREATININE 0.31* 0.49  --  0.37* 0.36* 0.45 0.33*  CALCIUM 7.8* 8.4*  --  8.2* 8.0* 8.4* 8.2*  MG 1.7 1.6*  --  1.6* 1.3* 1.3* 1.6*  PHOS 2.3* 3.0  --   --   --   --   --    GFR: Estimated Creatinine Clearance: 58.6 mL/min (A) (by C-G formula based on SCr of 0.33 mg/dL (L)). Liver Function Tests: Recent Labs  Lab 08/15/18 0500 08/16/18 0520 08/17/18 0409 08/18/18 0543 08/20/18 0300  AST 64* 83* 92* 68* 123*  ALT 46* 44 45* 44 78*  ALKPHOS 74 68 76 82 81  BILITOT 1.0 1.0 1.1 1.4*  0.9  PROT 5.2* 4.6* 4.4* 4.7* 4.5*  ALBUMIN 2.7* 2.3* 2.2* 2.2* 2.2*   No results for input(s): LIPASE, AMYLASE in the last 168 hours. No results for input(s): AMMONIA in the last 168 hours. Coagulation Profile: No results for input(s): INR, PROTIME in the last 168 hours. Cardiac Enzymes: No results for input(s): CKTOTAL, CKMB, CKMBINDEX, TROPONINI in the last 168 hours. BNP (last 3 results) No results for input(s): PROBNP in the last 8760 hours. HbA1C: No results for input(s): HGBA1C in the last 72 hours. CBG: Recent Labs  Lab 08/16/18 1130 08/17/18 0752 08/18/18 0729 08/19/18 0727 08/20/18 0828  GLUCAP 118* 83 93 98 99   Lipid Profile: No results for input(s): CHOL, HDL, LDLCALC, TRIG, CHOLHDL, LDLDIRECT in the last 72 hours. Thyroid Function Tests: No results for input(s): TSH, T4TOTAL, FREET4, T3FREE, THYROIDAB in the last 72 hours. Anemia Panel: No results  for input(s): VITAMINB12, FOLATE, FERRITIN, TIBC, IRON, RETICCTPCT in the last 72 hours. Sepsis Labs: No results for input(s): PROCALCITON, LATICACIDVEN in the last 168 hours.  Recent Results (from the past 240 hour(s))  Blood Culture (routine x 2)     Status: None   Collection Time: 08/12/18 11:40 PM  Result Value Ref Range Status   Specimen Description BLOOD RIGHT ANTECUBITAL  Final   Special Requests   Final    BOTTLES DRAWN AEROBIC AND ANAEROBIC Blood Culture results may not be optimal due to an excessive volume of blood received in culture bottles   Culture   Final    NO GROWTH 5 DAYS Performed at [place] [address], [place], [place] [address]    Report Status 08/18/2018 FINAL  Final  Blood Culture (routine x 2)     Status: None   Collection Time: 08/12/18 11:56 PM  Result Value Ref Range Status   Specimen Description BLOOD RIGHT ARM  Final   Special Requests   Final    BOTTLES DRAWN AEROBIC ONLY Blood Culture results may not be optimal due to an excessive volume of blood received in culture bottles   Culture   Final    NO GROWTH 5 DAYS Performed at [facility], [place] [address], [place], [place] [address]    Report Status 08/18/2018 FINAL  Final  MRSA PCR Screening     Status: None   Collection Time: 08/13/18  5:38 AM  Result Value Ref Range Status   MRSA by PCR NEGATIVE NEGATIVE Final    Comment:        The GeneXpert MRSA Assay (FDA approved for NASAL specimens only), is one component of a comprehensive MRSA colonization surveillance program. It is not intended to diagnose MRSA infection nor to guide or monitor treatment for MRSA infections. Performed at [facility], [place] [address] [place], [place] [address]          Radiology Studies: No results found.      Scheduled Meds: . amoxicillin-clavulanate  1 tablet Oral Q12H  . atorvastatin  10 mg Oral q1800  . busPIRone  10 mg Oral TID  . docusate sodium  100 mg Oral BID  .  escitalopram  10 mg Oral Daily  . feeding supplement (ENSURE ENLIVE)  237 mL Oral TID BM  . folic acid  1 mg Oral Daily  . lidocaine  1 patch Transdermal Q24H  . mouth rinse  15 mL Mouth Rinse BID  . methylPREDNISolone (SOLU-MEDROL) injection  40 mg Intravenous Q12H  . metoprolol tartrate  10 mg Intravenous Q6H  . sodium chloride flush  3 mL Intravenous Q12H  . thiamine  100 mg Oral Daily   Continuous Infusions:   LOS: 7 days     Georgette Shell, MD [facility]  If 7PM-7AM, please contact night-coverage [email or web address] Password [email or web address] 08/20/2018, 12:10 PM

## 2018-08-21 LAB — GLUCOSE, CAPILLARY: Glucose-Capillary: 100 mg/dL — ABNORMAL HIGH (ref 70–99)

## 2018-08-21 MED ORDER — PROPRANOLOL HCL 10 MG PO TABS
10.0000 mg | ORAL_TABLET | Freq: Three times a day (TID) | ORAL | Status: DC
  Administered 2018-08-21: 10 mg via ORAL
  Filled 2018-08-21 (×7): qty 1

## 2018-08-21 MED ORDER — LORAZEPAM 2 MG/ML IJ SOLN
1.0000 mg | Freq: Four times a day (QID) | INTRAMUSCULAR | Status: DC | PRN
  Administered 2018-08-24: 2 mg via INTRAVENOUS
  Filled 2018-08-21 (×3): qty 1

## 2018-08-21 MED ORDER — POTASSIUM CHLORIDE CRYS ER 20 MEQ PO TBCR
40.0000 meq | EXTENDED_RELEASE_TABLET | Freq: Once | ORAL | Status: DC
  Filled 2018-08-21: qty 2

## 2018-08-21 NOTE — Progress Notes (Signed)
OT Cancellation Note  Patient Details Name: [name] MRN: [id number] DOB: 18-May-1951   Cancelled Treatment:    Reason Eval/Treat Not Completed: Patient declined, no reason specified.  Patient supine in bed resting comfortably, declining participation in OT voicing "I just don't feel like it.".  Will follow and return as able.   Delight Stare, OT Acute Rehabilitation Services Pager [phone] Office [phone]   Delight Stare 08/21/2018, 3:44 PM

## 2018-08-21 NOTE — Social Work (Addendum)
12:30pm- Pt step daughter found CSW outside of the room and states that she still would like to speak with MD, is leaving for a birthday party but would like MD to call her. Cell phone contact shared with MD. I did let pt step daughter know there was likely a role for APS to be involved should pt discharge home, pt step daughter agrees. She states she has pictures and cleaned pt house as best she could, she does not think pt has [place]. Emphasized that pt step daughter needs to be forthcoming with her reservations to pt discharging home and do things such as show the pictures taken to let pt know why she feels that way. Pt step daughter in agreement.  11:38am- CSW spoke with pt at bedside. Pt step daughter [name] also at bedside. Reintroduced self, role, and reason for visit. CSW again broached conversation of SNF placement. We discussed continued therapy evals that have stated that she is not safe to d/c home and that medical care team knows she does not have 24/7 assistance once [name] returns to [place]. CSW brought up concern for home mobility and safet and again attempted to discuss ETOH use with pt. Pt continues to state that she wasn't drinking prior to event although pt step daughter shared with Air traffic controller that she had cleared copious amounts of wine bottles from the house this weekend. Pt refuses to admit that this is a danger to continue to drink even after arm is healed. Pt step daughter did not participate in this conversation, sitting quietly to the side.  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   CSW re-centered conversation around current disposition- that we as a whole care  team do not feel pt is safe to d/c home but we recognize right to self determination. CSW inquired about HCPOA and pt states she has one, she gave hospital one in the past and states she will not provide CSW with a copy. CSW encouraged pt to provide [name] with copy should pt need someone to make medical decisions on her behalf in the future.  Pt very upset, repeatedly states that she will return home and doesn't want to go to SNF. Pt states she is leaving tomorrow whether or not she is medically cleared.  Will reach out to [facility] regarding additional support with disposition with pt. Also will page MD as pt step daughter has many questions regarding disposition.  Westley Hummer, MSW, [place] [employer] [phone]

## 2018-08-21 NOTE — Progress Notes (Signed)
Physical Therapy Treatment Patient Details Name: [name] MRN: [id number] DOB: 07-01-1951 Today's Date: 08/21/2018    History of Present Illness Pt is a 68 y.o. female with medical history significant for hypertension, anxiety, cancer of the right lung status post chemotherapy, now presenting to the emergency department after she was found down on the floor, reporting that she had fallen on 08/10/2018 and was unable to get up.  S/p L reverse total shoulder arthroplasty on 08/14/18  after finding L displaced proximal humerus fracture.  CIWA protocol.      PT Comments    Pt requiring max encouragement to participate. Pt with c/o nausea, RN made aware. Pt adamant about going home and states "I was suppose to go home yesterday." Pt however very unsafe and I can not recommend she go home as she requires mod-maxA for all mobility and ADLs at this time. Pt also states she is on RA at home however is on 3LO2 via [place] and has SOB with all mobility. In order for patient to go home safely she will need 24/7 assist that can provide at the mod/max level. Pt currently unable to bath, dress, transfer, or walk without assist. Pt also desats significantly on RA (noted below.) With max encouragment pt tolerated amb 80' with modA.   SATURATION QUALIFICATIONS: (This note is used to comply with regulatory documentation for home oxygen)  Patient Saturations on Room Air at Rest = 82%  Patient Saturations on Room Air while Ambulating = didn't trial due to dropping to 82% lying in bed  Patient Saturations on 3 Liters of oxygen while Ambulating = 92%  Please briefly explain why patient needs home oxygen: pt unable to maintain SpO2 >88% on RA   Follow Up Recommendations  SNF(adamantly refusing)     Equipment Recommendations       Recommendations for Other Services       Precautions / Restrictions Precautions Precautions: Shoulder;Fall Type of Shoulder Precautions: no shoulder ROM, active ROM  elbow/wrist/hand  Shoulder Interventions: Shoulder sling/immobilizer;At all times;Off for dressing/bathing/exercises Precaution Booklet Issued: Yes (comment) Precaution Comments: education on NWB and sling use at all times; emphasized no ROM to shoulder Required Braces or Orthoses: Sling Restrictions Weight Bearing Restrictions: Yes LUE Weight Bearing: Non weight bearing    Mobility  Bed Mobility Overal bed mobility: Needs Assistance Bed Mobility: Supine to Sit;Sit to Supine     Supine to sit: Mod assist Sit to supine: Min assist   General bed mobility comments: modA for trunk elevation due to inability to use L UE, pt with SOB as well, labored effort  Transfers Overall transfer level: Needs assistance Equipment used: 1 person hand held assist Transfers: Sit to/from Stand Sit to Stand: Mod assist;+2 safety/equipment         General transfer comment: modA to power up, pt with posterior lean during stand and then L lateral lean once in standing requiring mod/maxA to maintain balance and midline posture  Ambulation/Gait Ambulation/Gait assistance: Mod assist;+2 safety/equipment Gait Distance (Feet): 75 Feet Assistive device: 1 person hand held assist Gait Pattern/deviations: Step-through pattern;Decreased stride length;Staggering left;Decreased weight shift to right;Narrow base of support Gait velocity: slow Gait velocity interpretation: <1.8 ft/sec, indicate of risk for recurrent falls General Gait Details: pt very unsteady, pt with strong L lateral lean requiring modA to prevent fall and max verbal cues to shift weight towards the R to achieve midline. verbal cues also to push down with R UE into PTs hand to mimic cane.  Stairs             Wheelchair Mobility    Modified Rankin (Stroke Patients Only)       Balance Overall balance assessment: Needs assistance Sitting-balance support: Single extremity supported;Feet supported Sitting balance-Leahy Scale:  Fair Sitting balance - Comments: required modA to maintain balance to attempt to don socks, pt ultimately needed modA to don socks at EOB   Standing balance support: Single extremity supported;During functional activity Standing balance-Leahy Scale: Poor Standing balance comment: reliant on UE and external support                            Cognition Arousal/Alertness: Awake/alert Behavior During Therapy: Anxious Overall Cognitive Status: Impaired/Different from baseline Area of Impairment: Safety/judgement;Awareness;Problem solving;Attention                   Current Attention Level: Sustained Memory: Decreased recall of precautions(continues to forget she can't use her L UE) Following Commands: Follows one step commands consistently Safety/Judgement: Decreased awareness of safety;Decreased awareness of deficits Awareness: Emergent Problem Solving: Slow processing;Decreased initiation;Difficulty sequencing;Requires verbal cues;Requires tactile cues General Comments: pt adamant about going home despite using purwick, requiring assist for all mobility and ADLs, con't to try to use L UE despite max verbal cues not to due to precautions from shoulder surgery. Pt with no carryover on importance of OOB mobility with recovery.       Exercises      General Comments General comments (skin integrity, edema, etc.): Pt reports no being on oxygen at home. pt on 3lO2 via [place] at 95%, pt desat to 82% on RA. pt c/o nausea limiting mobility per patient      Pertinent Vitals/Pain Pain Assessment: 0-10 Pain Score: 6  Pain Location: L UE, abdomen/back Pain Descriptors / Indicators: Discomfort;Operative site guarding;Guarding;Grimacing Pain Intervention(s): Monitored during session    Home Living                      Prior Function            PT Goals (current goals can now be found in the care plan section) Acute Rehab PT Goals Patient Stated Goal: go home  yesterday Progress towards PT goals: Progressing toward goals    Frequency    Min 3X/week      PT Plan Current plan remains appropriate    Co-evaluation              AM-PAC PT "6 Clicks" Mobility   Outcome Measure  Help needed turning from your back to your side while in a flat bed without using bedrails?: A Lot Help needed moving from lying on your back to sitting on the side of a flat bed without using bedrails?: A Lot Help needed moving to and from a bed to a chair (including a wheelchair)?: A Lot Help needed standing up from a chair using your arms (e.g., wheelchair or bedside chair)?: A Lot Help needed to walk in hospital room?: A Lot Help needed climbing 3-5 steps with a railing? : Total 6 Click Score: 11    End of Session Equipment Utilized During Treatment: Gait belt;Oxygen Activity Tolerance: (limited by nausea) Patient left: in bed;with call bell/phone within reach;with bed alarm set(HOB elevated to 40 deg) Nurse Communication: Mobility status PT Visit Diagnosis: Other abnormalities of gait and mobility (R26.89);Muscle weakness (generalized) (M62.81);History of falling (Z91.81)     Time: 5188-4166 PT Time Calculation (  min) (ACUTE ONLY): 40 min  Charges:  $Gait Training: 8-22 mins $Therapeutic Activity: 23-37 mins                     Kittie Plater, PT, DPT Acute Rehabilitation Services Pager #: [phone] Office #: [phone]    Berline Lopes 08/21/2018, 8:52 AM

## 2018-08-21 NOTE — Progress Notes (Signed)
   Subjective: 7 Days Post-Op Procedure(s) (LRB): REVERSE SHOULDER ARTHROPLASTY (Left)  Recheck left shoulder s/p reverse total shoulder Pt c/o mild pain but overall feels better Still confused as to what procedure she had completed Discussed with pt and family in the room Patient reports pain as mild.  Objective:   VITALS:   Vitals:   08/21/18 0000 08/21/18 0400  BP: 106/68 140/72  Pulse: (!) 57 (!) 59  Resp: 11 14  Temp:  98 F (36.7 C)  SpO2: 94% 99%    Left shoulder incision healing well nv intact distally Sling in place No rashes or edema distally  LABS Recent Labs    08/20/18 0300  HGB 10.2*  HCT 32.1*  WBC 4.6  PLT 240    Recent Labs    08/20/18 0300  NA 136  K 3.2*  BUN 9  CREATININE 0.33*  GLUCOSE 141*     Assessment/Plan: 7 Days Post-Op Procedure(s) (LRB): REVERSE SHOULDER ARTHROPLASTY (Left) PT/OT as able Currently left shoulder is stable F/u in 2 weeks after discharge in our office Will continue to monitor her progress    Merla Riches PA-C, [facility] [facility] now [employer] [facility] [address], [place], [place] [address] Phone: [phone] [email or web address] Facebook  [employer]

## 2018-08-21 NOTE — Progress Notes (Addendum)
[place] TEAM 1 - Stepdown/ICU TEAM  SMT LOKEY  IPJ:[id number] DOB: April 14, 1951 DOA: 08/12/2018 PCP: Wenda Low, MD    Brief Narrative:  68yo female w/ a hx of hypertension, anxiety, and cancer of the right lung status post RLL lobectomy and chemotherapy in 2007 who was brought to the ED 2/2 after she was found down on the floor surrounded by multiple wine bottles and soiled in feces and urine, reporting that she had fallen on 08/10/2018 and was unable to get up. Patient reported she had been drinking wine daily since her husband died, but was unable to quantify further.   In the ED CT head was negative for acute intracranial abnormality and cervical spine CT was negative for acute pathology. Radiographs of the left shoulder reveal angulated humeral neck fracture with possible extension into the humeral head. CT of chest/abdomen/pelvis was negative for acute finding other than the humerus fracture.   Significant Events: 2/2 admit after fall at home w/ prolonged down time  2/3 OR - reverse left shoulder arthroplasty with tuberosity repair  Subjective: The patient appears to be resting comfortably in bed.  She is markedly improved since the last time that I saw her.  She is alert oriented and conversant.  She confirms to me that she does not have any desire to enter a rehab facility and that her plan remains to discharge home when she is medically clear.  She denies shortness of breath chest pain.  She reports that she has very poor appetite.  Assessment & Plan:  Hypotension - severe dehydration Corrected with volume resuscitation and albumin infusion  Alcohol dependence with alcohol withdrawal Pt denies abuse but family has confirmed 2-3 bottles of wine/day habitually -appears to have stabilized at this time  Severe benign essential tremor Likely exacerbated by anxiety and EtOH withdrawal - much improved with resolution of alcohol withdrawal  L basilar PNA - suspected aspiration    Cont empiric abx coverage to complete 7 days of treatment  Severe Hypokalemia Improving but K+ not yet stable in normal range - suspect profound total body deficit in setting of alcoholism and malnutrition - cont to supplement   Severe Hypomagnesemia  suspect significant total body deficit related to malnutrition of alcoholism - still not at goal - cont to supplement   Transaminitis Likely due to a combination of chronic heavy alcohol intake as well as mild shock liver - no signif change   Proximal left humerus fracture Taken to the OR per Ortho - ongoing care per Orthopedics  Macrocytic anemia s/p 1U PRBC - hemoglobin stable - B12 and folate normal   COPD Wheezing now resolved - follow   DVT prophylaxis: SCDs Code Status: FULL CODE Family Communication: No family present Disposition Plan: Possible discharge 2/11  Consultants:  Orthopedics  PCCM  Antimicrobials:  Augmentin 2/6 >  Objective: Blood pressure 140/72, pulse (!) 59, temperature 98 F (36.7 C), temperature source Oral, resp. rate 14, height 5' 4.5" (1.638 m), weight 64 kg, SpO2 99 %.  Intake/Output Summary (Last 24 hours) at 08/21/2018 1504 Last data filed at 08/21/2018 0900 Gross per 24 hour  Intake 240 ml  Output 700 ml  Net -460 ml   Filed Weights   08/16/18 0408 08/17/18 0500 08/18/18 0500  Weight: 67.1 kg 68.2 kg 64 kg    Examination: General: No acute respiratory distress -alert and conversant Lungs: L basilar crackles without wheezing Cardiovascular: RRR without murmur or rub Abdomen: NT/ND, soft, bs+, no mass  Extremities:  No C/C/E bilateral lower extremities  CBC: Recent Labs  Lab 08/15/18 0500 08/15/18 1736 08/16/18 0520 08/17/18 0409 08/18/18 0543 08/20/18 0300  WBC 9.6  --  6.0 5.7 3.4* 4.6  HGB 10.5* 8.5* 9.2* 9.0* 10.4* 10.2*  HCT 33.7* 25.0* 30.2* 28.4* 32.3* 32.1*  MCV 109.1*  --  111.9* 110.9* 107.0* 104.9*  PLT 193  --  180 184 206 756   Basic Metabolic Panel: Recent  Labs  Lab 08/15/18 0500 08/15/18 1736 08/16/18 0520 08/17/18 0409 08/18/18 0543 08/20/18 0300  NA 139 132* 140 139 137 136  K 4.2 3.5 3.9 3.8 3.5 3.2*  CL 102  --  105 105 91* 91*  CO2 24  --  31 27 31  36*  GLUCOSE 119*  --  90 83 102* 141*  BUN 6*  --  6* <5* <5* 9  CREATININE 0.49  --  0.37* 0.36* 0.45 0.33*  CALCIUM 8.4*  --  8.2* 8.0* 8.4* 8.2*  MG 1.6*  --  1.6* 1.3* 1.3* 1.6*  PHOS 3.0  --   --   --   --   --    GFR: Estimated Creatinine Clearance: 58.6 mL/min (A) (by C-G formula based on SCr of 0.33 mg/dL (L)).  Liver Function Tests: Recent Labs  Lab 08/15/18 0500 08/16/18 0520 08/17/18 0409 08/18/18 0543 08/20/18 0300  AST 64* 83* 92* 68* 123*  ALT 46* 44 45* 44 78*  ALKPHOS 74 68 76 82 81  BILITOT 1.0 1.0 1.1 1.4* 0.9  PROT 5.2* 4.6* 4.4* 4.7* 4.5*  ALBUMIN 2.7* 2.3* 2.2* 2.2* 2.2*     Recent Results (from the past 240 hour(s))  Blood Culture (routine x 2)     Status: None   Collection Time: 08/12/18 11:40 PM  Result Value Ref Range Status   Specimen Description BLOOD RIGHT ANTECUBITAL  Final   Special Requests   Final    BOTTLES DRAWN AEROBIC AND ANAEROBIC Blood Culture results may not be optimal due to an excessive volume of blood received in culture bottles   Culture   Final    NO GROWTH 5 DAYS Performed at [facility], [address], [place], [place] [address]    Report Status 08/18/2018 FINAL  Final  Blood Culture (routine x 2)     Status: None   Collection Time: 08/12/18 11:56 PM  Result Value Ref Range Status   Specimen Description BLOOD RIGHT ARM  Final   Special Requests   Final    BOTTLES DRAWN AEROBIC ONLY Blood Culture results may not be optimal due to an excessive volume of blood received in culture bottles   Culture   Final    NO GROWTH 5 DAYS Performed at [facility], [place] [address], [place], [place] [address]    Report Status 08/18/2018 FINAL  Final  MRSA PCR Screening     Status: None   Collection Time: 08/13/18   5:38 AM  Result Value Ref Range Status   MRSA by PCR NEGATIVE NEGATIVE Final    Comment:        The GeneXpert MRSA Assay (FDA approved for NASAL specimens only), is one component of a comprehensive MRSA colonization surveillance program. It is not intended to diagnose MRSA infection nor to guide or monitor treatment for MRSA infections. Performed at [facility], [place] [address] [place], [place] [address]      Scheduled Meds: . amoxicillin-clavulanate  1 tablet Oral Q12H  . atorvastatin  10 mg Oral q1800  . busPIRone  10 mg Oral TID  . docusate sodium  100 mg Oral BID  . escitalopram  10 mg Oral Daily  . feeding supplement (ENSURE ENLIVE)  237 mL Oral TID BM  . folic acid  1 mg Oral Daily  . lidocaine  1 patch Transdermal Q24H  . mouth rinse  15 mL Mouth Rinse BID  . methylPREDNISolone (SOLU-MEDROL) injection  40 mg Intravenous Q12H  . metoprolol tartrate  10 mg Intravenous Q6H  . sodium chloride flush  3 mL Intravenous Q12H  . thiamine  100 mg Oral Daily     LOS: 8 days    Cherene Altes, MD [facility] Office  [phone] Pager - Text Page per Shea Evans as per below:  On-Call/Text Page:      [email or web address]  If 7PM-7AM, please contact night-coverage [email or web address] 08/21/2018, 3:04 PM

## 2018-08-22 DIAGNOSIS — E440 Moderate protein-calorie malnutrition: Secondary | ICD-10-CM

## 2018-08-22 LAB — COMPREHENSIVE METABOLIC PANEL
ALT: 149 U/L — ABNORMAL HIGH (ref 0–44)
AST: 149 U/L — ABNORMAL HIGH (ref 15–41)
Albumin: 2.6 g/dL — ABNORMAL LOW (ref 3.5–5.0)
Alkaline Phosphatase: 86 U/L (ref 38–126)
Anion gap: 8 (ref 5–15)
BUN: 6 mg/dL — ABNORMAL LOW (ref 8–23)
CHLORIDE: 94 mmol/L — AB (ref 98–111)
CO2: 35 mmol/L — ABNORMAL HIGH (ref 22–32)
Calcium: 8.5 mg/dL — ABNORMAL LOW (ref 8.9–10.3)
Creatinine, Ser: 0.33 mg/dL — ABNORMAL LOW (ref 0.44–1.00)
GFR calc Af Amer: >60 mL/min (ref >60)
Glucose, Bld: 93 mg/dL (ref 70–99)
Potassium: 3.6 mmol/L (ref 3.5–5.1)
Sodium: 137 mmol/L (ref 135–145)
Total Bilirubin: 0.8 mg/dL (ref 0.3–1.2)
Total Protein: 5.1 g/dL — ABNORMAL LOW (ref 6.5–8.1)

## 2018-08-22 LAB — CBC
HCT: 34.2 % — ABNORMAL LOW (ref 36.0–46.0)
Hemoglobin: 11 g/dL — ABNORMAL LOW (ref 12.0–15.0)
MCH: 33.5 pg (ref 26.0–34.0)
MCHC: 32.2 g/dL (ref 30.0–36.0)
MCV: 104.3 fL — AB (ref 80.0–100.0)
NRBC: 0 % (ref 0.0–0.2)
Platelets: 295 10*3/uL (ref 150–400)
RBC: 3.28 MIL/uL — ABNORMAL LOW (ref 3.87–5.11)
RDW: 12.9 % (ref 11.5–15.5)
WBC: 5.4 10*3/uL (ref 4.0–10.5)

## 2018-08-22 LAB — GLUCOSE, CAPILLARY: Glucose-Capillary: 90 mg/dL (ref 70–99)

## 2018-08-22 LAB — MAGNESIUM
Magnesium: 1.4 mg/dL — ABNORMAL LOW (ref 1.7–2.4)
Magnesium: 2.3 mg/dL (ref 1.7–2.4)

## 2018-08-22 MED ORDER — MAGNESIUM SULFATE 4 GM/100ML IV SOLN
4.0000 g | Freq: Once | INTRAVENOUS | Status: AC
  Administered 2018-08-22: 4 g via INTRAVENOUS
  Filled 2018-08-22: qty 100

## 2018-08-22 MED ORDER — NICOTINE POLACRILEX 2 MG MT GUM
2.0000 mg | CHEWING_GUM | OROMUCOSAL | Status: DC | PRN
  Administered 2018-08-22 – 2018-08-26 (×7): 2 mg via ORAL
  Filled 2018-08-22 (×9): qty 1

## 2018-08-22 MED ORDER — MAGNESIUM OXIDE 400 (241.3 MG) MG PO TABS
400.0000 mg | ORAL_TABLET | Freq: Two times a day (BID) | ORAL | Status: DC
  Administered 2018-08-22 – 2018-08-23 (×2): 400 mg via ORAL
  Filled 2018-08-22 (×6): qty 1

## 2018-08-22 MED ORDER — POTASSIUM CHLORIDE CRYS ER 20 MEQ PO TBCR
40.0000 meq | EXTENDED_RELEASE_TABLET | Freq: Two times a day (BID) | ORAL | Status: DC
  Administered 2018-08-23: 40 meq via ORAL
  Filled 2018-08-22 (×2): qty 2

## 2018-08-22 NOTE — Progress Notes (Signed)
Pt OOB to chair, despite protestations, she did cooperate. Gait weak and unsteady for a simple transfer to chair. Is able to pull up to sitting on SOB. Has been in chair for a while. Vital Signs stable, after desaturating just after getting up to the chair. Simmie Davies RN

## 2018-08-22 NOTE — Progress Notes (Signed)
[facility] TEAM 1 - Stepdown/ICU TEAM  [name]  OQH:[id number] DOB: 1951-03-28 DOA: 08/12/2018 PCP: Wenda Low, MD    Brief Narrative:  68yo female w/ a hx of hypertension, anxiety, and cancer of the right lung status post RLL lobectomy and chemotherapy in 2007 who was brought to the ED 2/2 after she was found down on the floor surrounded by multiple wine bottles and soiled in feces and urine, reporting that she had fallen on 08/10/2018 and was unable to get up. Patient reported she had been drinking wine daily since her husband died, but was unable to quantify further.   In the ED CT head was negative for acute intracranial abnormality and cervical spine CT was negative for acute pathology. Radiographs of the left shoulder reveal angulated humeral neck fracture with possible extension into the humeral head. CT of chest/abdomen/pelvis was negative for acute finding other than the humerus fracture.   Significant Events: 2/2 admit after fall at home w/ prolonged down time  2/3 OR - reverse left shoulder arthroplasty with tuberosity repair  Subjective: The patient has thus far refused to work with the therapy team this morning.  She complains of being tired and that she did not sleep last night and has not been allowed to sleep this morning.  She states the pain in her shoulder is controlled.  She denies shortness of breath nausea vomiting or abdominal pain.  She currently remains on 3 L nasal cannula oxygen.  Assessment & Plan:  Hypotension - severe dehydration Corrected with volume resuscitation and albumin infusion  Alcohol dependence with alcohol withdrawal Pt denies abuse but family has confirmed 2-3 bottles of wine/day habitually - appears to have stabilized at this time with resolution of alcohol withdrawal -low-dose benzo being used only as needed anxiety at this time  Severe benign essential tremor Likely exacerbated by anxiety and EtOH withdrawal - much improved with  resolution of alcohol withdrawal -continue beta-blocker  L basilar PNA - suspected aspiration -acute hypoxic respiratory failure Cont empiric abx coverage to complete 7 days of treatment -wean oxygen to room air as able  Severe Hypokalemia Improving but K+ not yet at goal of 4.0 or >- suspect profound total body deficit in setting of alcoholism and malnutrition - cont to supplement   Severe Hypomagnesemia  suspect significant total body deficit related to malnutrition of alcoholism - still not at goal - cont to supplement with IV dosing again today  Transaminitis Likely due to a combination of chronic heavy alcohol intake as well as mild shock liver - no signif change with some vacillating of lab values  Proximal left humerus fracture Taken to the OR per Ortho - ongoing care per Orthopedics   Macrocytic anemia s/p 1U PRBC - hemoglobin stable - B12 and folate normal   COPD Wheezing now resolved - follow and wean oxygen as able  DVT prophylaxis: SCDs Code Status: FULL CODE Family Communication: No family present and patient has expressly forbidden me from speaking to family members outside of her presence Disposition Plan: Transfer to telemetry bed -continue to supplement magnesium and potassium -wean oxygen as able -will potentially be ready for medical disposition in 24 hours depending upon stabilization of electrolytes  Consultants:  Orthopedics  PCCM  Antimicrobials:  Augmentin 2/6 >  Objective: Blood pressure 113/77, pulse 68, temperature 98 F (36.7 C), temperature source Oral, resp. rate 17, height 5' 4.5" (1.638 m), weight 64 kg, SpO2 97 %.  Intake/Output Summary (Last 24 hours) at 08/22/2018  1135 Last data filed at 08/22/2018 0346 Gross per 24 hour  Intake 240 ml  Output 1300 ml  Net -1060 ml   Filed Weights   08/16/18 0408 08/17/18 0500 08/18/18 0500  Weight: 67.1 kg 68.2 kg 64 kg    Examination: General: No acute respiratory distress on 3 L nasal  cannula Lungs: L basilar crackles stable with no wheeze throughout other fields Cardiovascular: RRR without murmur or rub Abdomen: NT/ND, soft, bs+, no mass  Extremities: No significant lower extremity edema  CBC: Recent Labs  Lab 08/16/18 0520 08/17/18 0409 08/18/18 0543 08/20/18 0300 08/22/18 0457  WBC 6.0 5.7 3.4* 4.6 5.4  HGB 9.2* 9.0* 10.4* 10.2* 11.0*  HCT 30.2* 28.4* 32.3* 32.1* 34.2*  MCV 111.9* 110.9* 107.0* 104.9* 104.3*  PLT 180 184 206 240 563   Basic Metabolic Panel: Recent Labs  Lab 08/16/18 0520 08/17/18 0409 08/18/18 0543 08/20/18 0300 08/22/18 0457  NA 140 139 137 136 137  K 3.9 3.8 3.5 3.2* 3.6  CL 105 105 91* 91* 94*  CO2 31 27 31  36* 35*  GLUCOSE 90 83 102* 141* 93  BUN 6* <5* <5* 9 6*  CREATININE 0.37* 0.36* 0.45 0.33* 0.33*  CALCIUM 8.2* 8.0* 8.4* 8.2* 8.5*  MG 1.6* 1.3* 1.3* 1.6* 1.4*   GFR: Estimated Creatinine Clearance: 58.6 mL/min (A) (by C-G formula based on SCr of 0.33 mg/dL (L)).  Liver Function Tests: Recent Labs  Lab 08/16/18 0520 08/17/18 0409 08/18/18 0543 08/20/18 0300 08/22/18 0457  AST 83* 92* 68* 123* 149*  ALT 44 45* 44 78* 149*  ALKPHOS 68 76 82 81 86  BILITOT 1.0 1.1 1.4* 0.9 0.8  PROT 4.6* 4.4* 4.7* 4.5* 5.1*  ALBUMIN 2.3* 2.2* 2.2* 2.2* 2.6*     Recent Results (from the past 240 hour(s))  Blood Culture (routine x 2)     Status: None   Collection Time: 08/12/18 11:40 PM  Result Value Ref Range Status   Specimen Description BLOOD RIGHT ANTECUBITAL  Final   Special Requests   Final    BOTTLES DRAWN AEROBIC AND ANAEROBIC Blood Culture results may not be optimal due to an excessive volume of blood received in culture bottles   Culture   Final    NO GROWTH 5 DAYS Performed at [facility], [address], [place], [place] [address]    Report Status 08/18/2018 FINAL  Final  Blood Culture (routine x 2)     Status: None   Collection Time: 08/12/18 11:56 PM  Result Value Ref Range Status   Specimen  Description BLOOD RIGHT ARM  Final   Special Requests   Final    BOTTLES DRAWN AEROBIC ONLY Blood Culture results may not be optimal due to an excessive volume of blood received in culture bottles   Culture   Final    NO GROWTH 5 DAYS Performed at [facility], [place] [address], [place], [place] [address]    Report Status 08/18/2018 FINAL  Final  MRSA PCR Screening     Status: None   Collection Time: 08/13/18  5:38 AM  Result Value Ref Range Status   MRSA by PCR NEGATIVE NEGATIVE Final    Comment:        The GeneXpert MRSA Assay (FDA approved for NASAL specimens only), is one component of a comprehensive MRSA colonization surveillance program. It is not intended to diagnose MRSA infection nor to guide or monitor treatment for MRSA infections. Performed at [facility], [address], [place], [place] [address]      Scheduled Meds: . atorvastatin  10 mg Oral q1800  . busPIRone  10 mg Oral TID  . docusate sodium  100 mg Oral BID  . escitalopram  10 mg Oral Daily  . feeding supplement (ENSURE ENLIVE)  237 mL Oral TID BM  . folic acid  1 mg Oral Daily  . lidocaine  1 patch Transdermal Q24H  . mouth rinse  15 mL Mouth Rinse BID  . potassium chloride  40 mEq Oral Once  . propranolol  10 mg Oral TID  . sodium chloride flush  3 mL Intravenous Q12H  . thiamine  100 mg Oral Daily     LOS: 9 days    Cherene Altes, MD [facility] Office  [phone] Pager - Text Page per Shea Evans as per below:  On-Call/Text Page:      [email or web address]  If 7PM-7AM, please contact night-coverage [email or web address] 08/22/2018, 11:35 AM

## 2018-08-22 NOTE — Care Management Important Message (Signed)
Important Message  Patient Details  Name: [name] MRN: [id number] Date of Birth: Jun 13, 1951   Medicare Important Message Given:  Yes    Vincenza Dail [address] Circle 08/22/2018, 3:40 PM

## 2018-08-22 NOTE — Progress Notes (Signed)
OT Cancellation Note  Patient Details Name: [name] MRN: [id number] DOB: December 23, 1950   Cancelled Treatment:    Reason Eval/Treat Not Completed: Patient declined, no reason specified, patient declining participation voicing "I don't want to, I want to take a nap right now.". Therapist reinforced importance of UE exercises after L shoulder sx, but patient continues to decline.  Will follow and re-attempt as able.   Delight Stare, OT Acute Rehabilitation Services Pager [phone] Office [phone]    Delight Stare 08/22/2018, 9:33 AM

## 2018-08-22 NOTE — Progress Notes (Signed)
Occupational Therapy Treatment Patient Details Name: [name] MRN: [id number] DOB: August 09, 1950 Today's Date: 08/22/2018    History of present illness Pt is a 68 y.o. female with medical history significant for hypertension, anxiety, cancer of the right lung status post chemotherapy, now presenting to the emergency department after she was found down on the floor, reporting that she had fallen on 08/10/2018 and was unable to get up.  S/p L reverse total shoulder arthroplasty on 08/14/18  after finding L displaced proximal humerus fracture.  CIWA protocol.     OT comments  Patient initially adamantly refusing OT, but then requests assistance to scoot up in bed and voicing "but you will need another person to help you".  +2 available, but placing bed in trendelenburg position patient utilize R UE/ BLEs to assist scooting up in bed with min assist +1.  Educated on positioning on sling, precautions and exercises to maintain ROM at elbow/wrist/hand- patient extremely frustrated with therapist voicing "you can leave, I am going to start therapy when I go home".  Therapist repositioned sling and placed pillow to support UE.  Supine in bed with call bell and bed alarm on.     Follow Up Recommendations  SNF;Supervision/Assistance - 24 hour    Equipment Recommendations  3 in 1 bedside commode    Recommendations for Other Services      Precautions / Restrictions Precautions Precautions: Shoulder;Fall Type of Shoulder Precautions: no shoulder ROM, active ROM elbow/wrist/hand  Shoulder Interventions: Shoulder sling/immobilizer;At all times;Off for dressing/bathing/exercises Precaution Comments: education on NWB and sling use at all times; emphasized no ROM to shoulder Required Braces or Orthoses: Sling Restrictions Weight Bearing Restrictions: Yes LUE Weight Bearing: Non weight bearing       Mobility Bed Mobility Overal bed mobility: Needs Assistance             General bed mobility  comments: supine in bed and requested to have assistance scooting up in bed; in trendelenburg position able to reach over head with R UE to pull and push with B LEs given min assist   Transfers                 General transfer comment: patient declines    Balance                                           ADL either performed or assessed with clinical judgement   ADL Overall ADL's : Needs assistance/impaired                                       General ADL Comments: patient declined engagement in ADLs      Vision       Perception     Praxis      Cognition Arousal/Alertness: Awake/alert Behavior During Therapy: Agitated Overall Cognitive Status: Impaired/Different from baseline Area of Impairment: Safety/judgement;Awareness;Problem solving                         Safety/Judgement: Decreased awareness of safety;Decreased awareness of deficits Awareness: Emergent Problem Solving: Slow processing;Decreased initiation;Difficulty sequencing;Requires verbal cues;Requires tactile cues General Comments: pt able to recall that therapist has returned 3x today, but reports "If i can't use my arm, then the doctor needs to tell  me that" with no recall of shoulder precautions         Exercises Other Exercises Other Exercises: patient supine in bed and educated on correct positioning of sling, repositioned with total assist.  Educated on importance of exercises, re-educated on UE precautions but patient declined further participation voicing "I'll start it when I get home".  Reinforced importance of early ROM of elbow, wrist, hand in order to maintain ROM and strength; patient adamently refused participation.    Shoulder Instructions Shoulder Instructions Correct positioning of sling/immobilizer: Maximal assistance     General Comments      Pertinent Vitals/ Pain       Pain Assessment: 0-10 Faces Pain Scale: Hurts little  more Pain Location: generalized  Pain Descriptors / Indicators: Discomfort;Operative site guarding;Guarding;Grimacing Pain Intervention(s): Monitored during session;Repositioned  Home Living                                          Prior Functioning/Environment              Frequency  Min 3X/week        Progress Toward Goals  OT Goals(current goals can now be found in the care plan section)  Progress towards OT goals: Not progressing toward goals - comment(patient declining to participate in OT (increased agitation))  Acute Rehab OT Goals Patient Stated Goal: go home yesterday OT Goal Formulation: With patient Time For Goal Achievement: 08/30/18 Potential to Achieve Goals: Good  Plan Discharge plan remains appropriate;Frequency remains appropriate    Co-evaluation                 AM-PAC OT "6 Clicks" Daily Activity     Outcome Measure   Help from another person eating meals?: A Little Help from another person taking care of personal grooming?: A Little Help from another person toileting, which includes using toliet, bedpan, or urinal?: Total Help from another person bathing (including washing, rinsing, drying)?: A Lot Help from another person to put on and taking off regular upper body clothing?: A Lot Help from another person to put on and taking off regular lower body clothing?: Total 6 Click Score: 12    End of Session Equipment Utilized During Treatment: Other (comment)(sling)  OT Visit Diagnosis: Unsteadiness on feet (R26.81);Other abnormalities of gait and mobility (R26.89);Muscle weakness (generalized) (M62.81);Pain;History of falling (Z91.81) Pain - Right/Left: Left Pain - part of body: Shoulder;Arm   Activity Tolerance Treatment limited secondary to agitation   Patient Left in bed;with call bell/phone within reach;with bed alarm set;with family/visitor present   Nurse Communication Mobility status;Precautions         Time: 9030-0923 OT Time Calculation (min): 12 min  Charges: OT General Charges $OT Visit: 1 Visit OT Treatments $Self Care/Home Management : 8-22 mins  Delight Stare, [place] Pager [phone] Office [phone]    Delight Stare 08/22/2018, 3:31 PM

## 2018-08-22 NOTE — Progress Notes (Signed)
PT Cancellation Note  Patient Details Name: [name] MRN: [id number] DOB: 1950-08-09   Cancelled Treatment:    Reason Eval/Treat Not Completed: (refused.) Pt adamantly refusing to work with PT x2 today. Pt stating "I need sleep. I need peace and quiet." "I will throw up if I don't get peace and quiet." Educated patient that lying in the bed will not aid in getting her home. Pt educated pt on importance of mobility and that she isn't safe to go home in this condition. Pt also educated that she will likely not get "peace and quiet" during day shift. Acute PT to cont to follow in attempt to progress mobility.  Kittie Plater, PT, DPT Acute Rehabilitation Services Pager #: [phone] Office #: [phone]    Berline Lopes 08/22/2018, 12:48 PM

## 2018-08-22 NOTE — Progress Notes (Signed)
Orthopedics Progress Note  Subjective: Patient that shoulder is feeling better today  Objective:  Vitals:   08/22/18 0346 08/22/18 0400  BP:  132/83  Pulse: 62 61  Resp: 14 12  Temp: 97.9 F (36.6 C)   SpO2: 100% 100%    General: Awake and alert  Musculoskeletal: Left shoulder dressing intact. Swelling down a lot. Moving the shoulder and the arm with minimal pain. Neurovascularly intact  Lab Results  Component Value Date   WBC 5.4 08/22/2018   HGB 11.0 (L) 08/22/2018   HCT 34.2 (L) 08/22/2018   MCV 104.3 (H) 08/22/2018   PLT 295 08/22/2018       Component Value Date/Time   NA 137 08/22/2018 0457   NA 144 03/26/2009 0824   K 3.6 08/22/2018 0457   K 4.7 03/26/2009 0824   CL 94 (L) 08/22/2018 0457   CL 106 03/26/2009 0824   CO2 35 (H) 08/22/2018 0457   CO2 31 03/26/2009 0824   GLUCOSE 93 08/22/2018 0457   GLUCOSE 96 03/26/2009 0824   BUN 6 (L) 08/22/2018 0457   BUN 9 03/26/2009 0824   CREATININE 0.33 (L) 08/22/2018 0457   CREATININE 0.5 (L) 03/26/2009 0824   CALCIUM 8.5 (L) 08/22/2018 0457   CALCIUM 9.4 03/26/2009 0824   GFRNONAA >60 08/22/2018 0457   GFRAA >60 08/22/2018 0457    Lab Results  Component Value Date   INR 0.95 08/12/2018    Assessment/Plan:  s/p Procedure(s): REVERSE SHOULDER ARTHROPLASTY Stable from ortho standpoint. Doing remarkably well actually. Continue OT.   Will need to see me in a week after discharge in the office  Remo Lipps R. Veverly Fells, MD 08/22/2018 7:42 AM

## 2018-08-23 DIAGNOSIS — F10239 Alcohol dependence with withdrawal, unspecified: Secondary | ICD-10-CM

## 2018-08-23 LAB — COMPREHENSIVE METABOLIC PANEL
ALT: 214 U/L — AB (ref 0–44)
AST: 206 U/L — ABNORMAL HIGH (ref 15–41)
Albumin: 2.7 g/dL — ABNORMAL LOW (ref 3.5–5.0)
Alkaline Phosphatase: 91 U/L (ref 38–126)
Anion gap: 4 — ABNORMAL LOW (ref 5–15)
BUN: <5 mg/dL — ABNORMAL LOW (ref 8–23)
CO2: 38 mmol/L — ABNORMAL HIGH (ref 22–32)
CREATININE: 0.36 mg/dL — AB (ref 0.44–1.00)
Calcium: 8.4 mg/dL — ABNORMAL LOW (ref 8.9–10.3)
Chloride: 94 mmol/L — ABNORMAL LOW (ref 98–111)
GFR calc Af Amer: >60 mL/min (ref >60)
Glucose, Bld: 95 mg/dL (ref 70–99)
Potassium: 3.3 mmol/L — ABNORMAL LOW (ref 3.5–5.1)
Sodium: 136 mmol/L (ref 135–145)
Total Bilirubin: 0.9 mg/dL (ref 0.3–1.2)
Total Protein: 5.3 g/dL — ABNORMAL LOW (ref 6.5–8.1)

## 2018-08-23 LAB — CBC
HCT: 36.1 % (ref 36.0–46.0)
Hemoglobin: 11.7 g/dL — ABNORMAL LOW (ref 12.0–15.0)
MCH: 33.3 pg (ref 26.0–34.0)
MCHC: 32.4 g/dL (ref 30.0–36.0)
MCV: 102.8 fL — ABNORMAL HIGH (ref 80.0–100.0)
Platelets: 350 10*3/uL (ref 150–400)
RBC: 3.51 MIL/uL — ABNORMAL LOW (ref 3.87–5.11)
RDW: 12.9 % (ref 11.5–15.5)
WBC: 8.2 10*3/uL (ref 4.0–10.5)
nRBC: 0 % (ref 0.0–0.2)

## 2018-08-23 LAB — GLUCOSE, CAPILLARY: GLUCOSE-CAPILLARY: 88 mg/dL (ref 70–99)

## 2018-08-23 LAB — MAGNESIUM: MAGNESIUM: 1.5 mg/dL — AB (ref 1.7–2.4)

## 2018-08-23 MED ORDER — POTASSIUM CHLORIDE 20 MEQ/15ML (10%) PO SOLN
40.0000 meq | Freq: Two times a day (BID) | ORAL | Status: AC
  Filled 2018-08-23 (×2): qty 30

## 2018-08-23 MED ORDER — OXYCODONE HCL 5 MG/5ML PO SOLN
5.0000 mg | Freq: Four times a day (QID) | ORAL | Status: DC | PRN
  Filled 2018-08-23: qty 10

## 2018-08-23 MED ORDER — PROPRANOLOL HCL 20 MG/5ML PO SOLN
10.0000 mg | Freq: Three times a day (TID) | ORAL | Status: DC
  Administered 2018-08-23 – 2018-08-25 (×3): 10 mg via ORAL
  Filled 2018-08-23 (×7): qty 2.5

## 2018-08-23 MED ORDER — DOCUSATE SODIUM 50 MG/5ML PO LIQD
100.0000 mg | Freq: Two times a day (BID) | ORAL | Status: DC
  Administered 2018-08-25: 100 mg via ORAL
  Filled 2018-08-23 (×3): qty 10

## 2018-08-23 MED ORDER — ACETAMINOPHEN 160 MG/5ML PO SOLN: 650.0000 mg | Freq: Four times a day (QID) | ORAL | Status: DC | PRN

## 2018-08-23 MED ORDER — SODIUM CHLORIDE 0.9 % IV SOLN
INTRAVENOUS | Status: DC
  Administered 2018-08-23: 22:00:00 via INTRAVENOUS

## 2018-08-23 MED ORDER — MAGNESIUM SULFATE 2 GM/50ML IV SOLN
2.0000 g | Freq: Once | INTRAVENOUS | Status: AC
  Administered 2018-08-23: 2 g via INTRAVENOUS
  Filled 2018-08-23: qty 50

## 2018-08-23 NOTE — Progress Notes (Signed)
PT Cancellation Note  Patient Details Name: [name] MRN: [id number] DOB: 1951/06/10   Cancelled Treatment:    Reason Eval/Treat Not Completed: Other (comment). Pt con't to adamantly refuse therapy and any mobility stating "I just want to be left alone. I'm not used to having people around and I dont want them now, please leave." Pt stating she is nauseated but is refusing her anti-nausea meds. Educated pt on importance of mobility and that the medical and therapy teams are here to help here get better so she can go home. Pt con't to state "I'll figure it out". Aware pt has a history of depression. Spoke with Dr. Tawanna Solo regarding patient depressed mood and refusal of medicines and therapy. Acute PT to cont to try t progress mobility as able.  Kittie Plater, PT, DPT Acute Rehabilitation Services Pager #: [phone] Office #: [phone]    Berline Lopes 08/23/2018, 12:04 PM

## 2018-08-23 NOTE — Progress Notes (Signed)
PROGRESS NOTE    [name]  FUX:[id number] DOB: 1950-11-12 DOA: 08/12/2018 PCP: Wenda Low, MD   Brief Narrative: Patient is a 68 year old female with past medical history of hypertension, anxiety, depression, right lung cancer status post right lower lobe lobectomy and chemotherapy interventions have and was brought to the emergency department after she was found on the floor surrounded by multiple wine bottles and soiled in feces and urine.  Reported that she had fallen on 08/10/2018 but was unable to get up.  CT imagings were negative for acute intracranial abnormality or spine pathology.  Radiograph of the left shoulder revealed angulated humeral neck fracture with possible extension to the humeral head.  Underwent reverse left shoulder arthroplasty with tuberosity repair by orthopedics on 08/14/2018.  Physical therapy recommended skilled facility on discharge but she is denying.  We consulted psychiatry today for her severe anxiety and depression.  Assessment & Plan:   Principal Problem:   Alcohol dependence with withdrawal (HCC) Active Problems:   COPD pfts pending/ active smoker   Closed left humeral fracture   Elevated LFTs   Hypokalemia   Macrocytic anemia   Hypotension due to hypovolemia   Closed fracture of left humerus   Malnutrition of moderate degree   H/O total shoulder replacement, left  Proximal left humeral fracture:  Underwent reverse left shoulder arthroplasty with tuberosity repair by orthopedics on 08/14/2018.  Physical therapy recommended skilled facility on discharge but she is denying. Currently pain looks well controlled.  She will follow-up with orthopedics on discharge.  Continue pain management.  Status post fall: Seen by physical therapy and recommended skilled services on discharge.  She is denying.  She might be discharged to home with home health after psychiatric clearance. Patient lives alone at home.  Hypotension: Resolved  Left basilar pneumonia:  Suspected aspiration.  Was in acute hypoxic respiratory failure earlier.  Respiratory status is improved.  She completed the  antibiotics course.  Sinus tachycardia: Could also be associated with alcohol withdrawal.  We will continue to monitor CIWA.  Continue with gentle IV fluids.Also on propanolol.Will check TSH  Chronic alcohol abuse: Continue thiamine, folic acid.  Watch for withdrawal symptoms.  Depression/anxiety: On buspirone and Lexapro.  Patient looks severely depressed.  She does not have any energy or positive feeling.  On my conversation today, she was almost saying she wants to see herself dead. I have requested for psychiatric evaluation.  Hypokalemia/hypomagnesemia: Being supplemented.  Transaminitis: Patient has history of chronic alcohol abuse.  Also was hypotensive on this hospitalization.  Will check acute hepatitis panel.We will continue to monitor.  Microcytic anemia: Continue folic acid.  B12 and folate normal.  She was transfused 1 unit PRBC during this hospitalization.  Currently hemoglobin stable.  COPD: Has mild expiratory wheezes.  On oxygen as needed at home.  We will continue to monitor her respiratory status and wean oxygen if possible.       Nutrition Problem: Moderate Malnutrition Etiology: social / environmental circumstances      DVT prophylaxis:SCD Code Status: Full Family Communication: None present at the bedside Disposition Plan: Home after psychiatric evaluation   Consultants: Orthopedics, PCCM  Procedures: Arthroplasty.  Antimicrobials:  Anti-infectives (From admission, onward)   Start     Dose/Rate Route Frequency Ordered Stop   08/17/18 1200  amoxicillin-clavulanate (AUGMENTIN) 875-125 MG per tablet 1 tablet     1 tablet Oral Every 12 hours 08/17/18 1058 08/21/18 2146   08/15/18 1300  Ampicillin-Sulbactam (UNASYN) 3 g in sodium  chloride 0.9 % 100 mL IVPB    Note to Pharmacy:  Pharmacy may adjust dose as indicated   3 g 200 mL/hr  over 30 Minutes Intravenous Every 8 hours 08/15/18 1201 08/16/18 1439   08/14/18 1145  ceFAZolin (ANCEF) IVPB 2g/100 mL premix     2 g 200 mL/hr over 30 Minutes Intravenous On call to O.R. 08/14/18 1140 08/14/18 1238   08/14/18 1143  ceFAZolin (ANCEF) 2-4 GM/100ML-% IVPB    Note to Pharmacy:  Lisette Grinder   : cabinet override      08/14/18 1143 08/14/18 1238   08/13/18 1730  Ampicillin-Sulbactam (UNASYN) 3 g in sodium chloride 0.9 % 100 mL IVPB  Status:  Discontinued    Note to Pharmacy:  Pharmacy may adjust dose as indicated   3 g 200 mL/hr over 30 Minutes Intravenous Every 8 hours 08/13/18 1729 08/15/18 1201   08/12/18 2330  ceFEPIme (MAXIPIME) 2 g in sodium chloride 0.9 % 100 mL IVPB     2 g 200 mL/hr over 30 Minutes Intravenous  Once 08/12/18 2322 08/13/18 0035   08/12/18 2330  metroNIDAZOLE (FLAGYL) IVPB 500 mg  Status:  Discontinued     500 mg 100 mL/hr over 60 Minutes Intravenous Every 8 hours 08/12/18 2322 08/13/18 0048   08/12/18 2330  vancomycin (VANCOCIN) IVPB 1000 mg/200 mL premix     1,000 mg 200 mL/hr over 60 Minutes Intravenous  Once 08/12/18 2322 08/13/18 0125      Subjective: Patient seen and examined the bedside this afternoon.  Was in sinus tachycardia.  She was notified to me that this is not taking her medications.  She does not want to go to skilled nursing facility and wants to go home.  But looks like she is severely depressed, anxious.  I have requested for psychiatric evaluation today.  Objective: Vitals:   08/22/18 2335 08/23/18 0339 08/23/18 0802 08/23/18 1145  BP: 110/65 113/77 118/75 122/82  Pulse: 70  86 94  Resp: 14 15 15 15   Temp: 97.9 F (36.6 C) 98.1 F (36.7 C) 97.6 F (36.4 C) 98.1 F (36.7 C)  TempSrc: Oral Oral Oral Oral  SpO2: 95%  97% 96%  Weight:      Height:        Intake/Output Summary (Last 24 hours) at 08/23/2018 1424 Last data filed at 08/23/2018 1146 Gross per 24 hour  Intake -  Output 1700 ml  Net -1700 ml   Filed  Weights   08/16/18 0408 08/17/18 0500 08/18/18 0500  Weight: 67.1 kg 68.2 kg 64 kg    Examination:  General exam: Not in distress,average built HEENT:PERRL,Oral mucosa moist, Ear/Nose normal on gross exam Respiratory system: Bilateral decreased air entry Cardiovascular system: Sinus tachycardia, no JVD, murmurs, rubs, gallops or clicks. No pedal edema. Gastrointestinal system: Abdomen is nondistended, soft and nontender. No organomegaly or masses felt. Normal bowel sounds heard. Central nervous system: Alert and oriented. No focal neurological deficits. Extremities: No edema, no clubbing ,no cyanosis, distal peripheral pulses palpable. Skin: Bruises/ecchymosis on her right shoulder and right back Psychiatry: Judgement and insight appear impaired, depressed   Data Reviewed: I have personally reviewed following labs and imaging studies  CBC: Recent Labs  Lab 08/17/18 0409 08/18/18 0543 08/20/18 0300 08/22/18 0457 08/23/18 0405  WBC 5.7 3.4* 4.6 5.4 8.2  HGB 9.0* 10.4* 10.2* 11.0* 11.7*  HCT 28.4* 32.3* 32.1* 34.2* 36.1  MCV 110.9* 107.0* 104.9* 104.3* 102.8*  PLT 184 206 240 295 350  Basic Metabolic Panel: Recent Labs  Lab 08/17/18 0409 08/18/18 0543 08/20/18 0300 08/22/18 0457 08/22/18 1434 08/23/18 0405  NA 139 137 136 137  --  136  K 3.8 3.5 3.2* 3.6  --  3.3*  CL 105 91* 91* 94*  --  94*  CO2 27 31 36* 35*  --  38*  GLUCOSE 83 102* 141* 93  --  95  BUN <5* <5* 9 6*  --  <5*  CREATININE 0.36* 0.45 0.33* 0.33*  --  0.36*  CALCIUM 8.0* 8.4* 8.2* 8.5*  --  8.4*  MG 1.3* 1.3* 1.6* 1.4* 2.3 1.5*   GFR: Estimated Creatinine Clearance: 58.6 mL/min (A) (by C-G formula based on SCr of 0.36 mg/dL (L)). Liver Function Tests: Recent Labs  Lab 08/17/18 0409 08/18/18 0543 08/20/18 0300 08/22/18 0457 08/23/18 0405  AST 92* 68* 123* 149* 206*  ALT 45* 44 78* 149* 214*  ALKPHOS 76 82 81 86 91  BILITOT 1.1 1.4* 0.9 0.8 0.9  PROT 4.4* 4.7* 4.5* 5.1* 5.3*  ALBUMIN  2.2* 2.2* 2.2* 2.6* 2.7*   No results for input(s): LIPASE, AMYLASE in the last 168 hours. No results for input(s): AMMONIA in the last 168 hours. Coagulation Profile: No results for input(s): INR, PROTIME in the last 168 hours. Cardiac Enzymes: No results for input(s): CKTOTAL, CKMB, CKMBINDEX, TROPONINI in the last 168 hours. BNP (last 3 results) No results for input(s): PROBNP in the last 8760 hours. HbA1C: No results for input(s): HGBA1C in the last 72 hours. CBG: Recent Labs  Lab 08/19/18 0727 08/20/18 0828 08/21/18 0757 08/22/18 0827 08/23/18 0804  GLUCAP 98 99 100* 90 88   Lipid Profile: No results for input(s): CHOL, HDL, LDLCALC, TRIG, CHOLHDL, LDLDIRECT in the last 72 hours. Thyroid Function Tests: No results for input(s): TSH, T4TOTAL, FREET4, T3FREE, THYROIDAB in the last 72 hours. Anemia Panel: No results for input(s): VITAMINB12, FOLATE, FERRITIN, TIBC, IRON, RETICCTPCT in the last 72 hours. Sepsis Labs: No results for input(s): PROCALCITON, LATICACIDVEN in the last 168 hours.  No results found for this or any previous visit (from the past 240 hour(s)).       Radiology Studies: No results found.      Scheduled Meds: . busPIRone  10 mg Oral TID  . docusate  100 mg Oral BID  . escitalopram  10 mg Oral Daily  . feeding supplement (ENSURE ENLIVE)  237 mL Oral TID BM  . folic acid  1 mg Oral Daily  . lidocaine  1 patch Transdermal Q24H  . magnesium oxide  400 mg Oral BID  . mouth rinse  15 mL Mouth Rinse BID  . potassium chloride  40 mEq Oral BID  . propranolol  10 mg Oral TID  . sodium chloride flush  3 mL Intravenous Q12H  . thiamine  100 mg Oral Daily   Continuous Infusions: . magnesium sulfate 1 - 4 g bolus IVPB       LOS: 10 days    Time spent: 35 mins.More than 50% of that time was spent in counseling and/or coordination of care.      Shelly Coss, MD [facility] Pager [phone]  If 7PM-7AM, please contact  night-coverage [email or web address] Password [facility] 08/23/2018, 2:24 PM

## 2018-08-23 NOTE — Social Work (Signed)
CSW discussed case in supervision with assistant director Nathaniel Man.  We discussed pt refusal to accept SNF offers and declining SNF placement. Pt has been deemed competent per MD evaluation on 2/7.   I cannot force pt to discharge to SNF, per assistant director pt has the right to discharge home.  [name], MSW, [place] Work [phone]

## 2018-08-23 NOTE — Progress Notes (Signed)
Nutrition Follow-up  DOCUMENTATION CODES:   Non-severe (moderate) malnutrition in context of social or environmental circumstances  INTERVENTION:  Continue Ensure Enlive po TID, each supplement provides 350 kcal and 20 grams of protein  Encourage adequate PO intake.   NUTRITION DIAGNOSIS:   Moderate Malnutrition related to social / environmental circumstances as evidenced by energy intake < 75% for > or equal to 3 months, mild fat depletion, mild muscle depletion; ongoing  GOAL:   Patient will meet greater than or equal to 90% of their needs; progressing  MONITOR:   PO intake, Supplement acceptance, Labs, Weight trends, I & O's, Skin  REASON FOR ASSESSMENT:   Malnutrition Screening Tool    ASSESSMENT:   68 yo female, admitted with alcohol dependence with withdrawal. PMH significant for HTN, lung cancer s/p chemotherapy, anemia.  PROCEDURE (2/3): REVERSE SHOULDER ARTHROPLASTY (Left)with tuberosity repair   Meal completion has been varied from 10-75%. Pt currently has ensure ordered with varied consumption. RD to continue with current orders to aid in caloric and protein needs. Pt encouraged to eat her food at meals and to drink her supplements.   Labs and medications reviewed.   Diet Order:   Diet Order            Diet regular Room service appropriate? Yes; Fluid consistency: Thin  Diet effective now              EDUCATION NEEDS:   No education needs have been identified at this time  Skin:  Skin Assessment: Skin Integrity Issues: Skin Integrity Issues:: Incisions Incisions: L arm Other: N/A  Last BM:  2/12  Height:   Ht Readings from Last 1 Encounters:  08/13/18 5' 4.5" (1.638 m)    Weight:   Wt Readings from Last 1 Encounters:  08/18/18 64 kg    Ideal Body Weight:  55.7 kg  BMI:  Body mass index is 23.84 kg/m.  Estimated Nutritional Needs:   Kcal:  3295-1884 (25-30 kcal/kg ABW)  Protein:  65-85 gm (1.0-1.3 g/kg ABW)  Fluid:  1  mL/kcal or per MD    Corrin Parker, MS, RD, LDN Pager # [phone] After hours/ weekend pager # [phone]

## 2018-08-24 LAB — TSH: TSH: 1.627 u[IU]/mL (ref 0.350–4.500)

## 2018-08-24 LAB — HEPATITIS PANEL, ACUTE
HCV Ab: <0.1 s/co ratio (ref 0.0–0.9)
HEP A IGM: NEGATIVE
Hep B C IgM: NEGATIVE
Hepatitis B Surface Ag: NEGATIVE

## 2018-08-24 LAB — GLUCOSE, CAPILLARY: Glucose-Capillary: 93 mg/dL (ref 70–99)

## 2018-08-24 LAB — COMPREHENSIVE METABOLIC PANEL
ALT: 149 U/L — ABNORMAL HIGH (ref 0–44)
AST: 107 U/L — ABNORMAL HIGH (ref 15–41)
Albumin: 2.5 g/dL — ABNORMAL LOW (ref 3.5–5.0)
Alkaline Phosphatase: 84 U/L (ref 38–126)
Anion gap: 10 (ref 5–15)
BUN: <5 mg/dL — ABNORMAL LOW (ref 8–23)
CO2: 31 mmol/L (ref 22–32)
Calcium: 8.5 mg/dL — ABNORMAL LOW (ref 8.9–10.3)
Chloride: 96 mmol/L — ABNORMAL LOW (ref 98–111)
Creatinine, Ser: 0.34 mg/dL — ABNORMAL LOW (ref 0.44–1.00)
GFR calc Af Amer: >60 mL/min (ref >60)
GFR calc non Af Amer: >60 mL/min (ref >60)
Glucose, Bld: 93 mg/dL (ref 70–99)
Potassium: 3.1 mmol/L — ABNORMAL LOW (ref 3.5–5.1)
Sodium: 137 mmol/L (ref 135–145)
Total Bilirubin: 0.9 mg/dL (ref 0.3–1.2)
Total Protein: 4.7 g/dL — ABNORMAL LOW (ref 6.5–8.1)

## 2018-08-24 LAB — MAGNESIUM: Magnesium: 1.7 mg/dL (ref 1.7–2.4)

## 2018-08-24 MED ORDER — POTASSIUM CHLORIDE CRYS ER 20 MEQ PO TBCR
40.0000 meq | EXTENDED_RELEASE_TABLET | Freq: Once | ORAL | Status: DC
  Filled 2018-08-24: qty 2

## 2018-08-24 NOTE — Progress Notes (Addendum)
PROGRESS NOTE    [name]  VFI:[id number] DOB: March 12, 1951 DOA: 08/12/2018 PCP: Wenda Low, MD   Brief Narrative: Patient is a 68 year old female with past medical history of hypertension, anxiety, depression, right lung cancer status post right lower lobe lobectomy and chemotherapy interventions have and was brought to the emergency department after she was found on the floor surrounded by multiple wine bottles and soiled in feces and urine.  Reported that she had fallen on 08/10/2018 but was unable to get up.  CT imagings were negative for acute intracranial abnormality or spine pathology.  Radiograph of the left shoulder revealed angulated humeral neck fracture with possible extension to the humeral head.  Underwent reverse left shoulder arthroplasty with tuberosity repair by orthopedics on 08/14/2018.  Physical therapy recommended skilled facility on discharge but she is denying.  We tried consulted psychiatry today for her severe anxiety and depression but she denies.Plan is to discharge her home.  Assessment & Plan:   Principal Problem:   Alcohol dependence with withdrawal (HCC) Active Problems:   COPD pfts pending/ active smoker   Closed left humeral fracture   Elevated LFTs   Hypokalemia   Macrocytic anemia   Hypotension due to hypovolemia   Closed fracture of left humerus   Malnutrition of moderate degree   H/O total shoulder replacement, left  Proximal left humeral fracture:  Underwent reverse left shoulder arthroplasty with tuberosity repair by orthopedics on 08/14/2018.  Physical therapy recommended skilled facility on discharge but she is denying. Currently pain looks well controlled.  She will follow-up with orthopedics on discharge.  Continue pain management.  Status post fall: Seen by physical therapy and recommended skilled services on discharge.  She is denying.  She might be discharged to home with home health .Denied evaluation by physical therapy today. Patient  lives alone at home.  Hypotension: Resolved  Left basilar pneumonia: Suspected aspiration.  Was in acute hypoxic respiratory failure earlier.  Respiratory status is improved.  She completed the  antibiotics course.  Sinus tachycardia: Much improved today.  IV fluids discontinued  Chronic alcohol abuse: Continue thiamine, folic acid.  Watch for withdrawal symptoms.  Depression/anxiety: On buspirone and Lexapro.  Patient looks depressed.  She does not have any energy or positive feeling.  On my conversation today.I  Discussed about  psychiatric evaluation but she denied.She needs follow up with psychiatry as an outpatient.  Hypokalemia/hypomagnesemia: Being supplemented.  Transaminitis: Patient has history of chronic alcohol abuse.  Also was hypotensive on this hospitalization.  Hepatitis panel negative.  Macrocytic anemia: Continue folic acid.  B12 and folate normal.  She was transfused 1 unit PRBC during this hospitalization.  Currently hemoglobin stable.  COPD: Has mild expiratory wheezes.  On oxygen as needed at home.  We will continue to monitor her respiratory status and wean oxygen if possible.       Nutrition Problem: Moderate Malnutrition Etiology: social / environmental circumstances      DVT prophylaxis:SCD Code Status: Full Family Communication: None present at the bedside Disposition Plan: Home tomorrow.  Patient continues to deny going to the rehab.  She is acceptable for home with physical therapy.  She does not want to go home today.  Consultants: Orthopedics, PCCM  Procedures: Arthroplasty.  Antimicrobials:  Anti-infectives (From admission, onward)   Start     Dose/Rate Route Frequency Ordered Stop   08/17/18 1200  amoxicillin-clavulanate (AUGMENTIN) 875-125 MG per tablet 1 tablet     1 tablet Oral Every 12 hours 08/17/18 1058  08/21/18 2146   08/15/18 1300  Ampicillin-Sulbactam (UNASYN) 3 g in sodium chloride 0.9 % 100 mL IVPB    Note to Pharmacy:   Pharmacy may adjust dose as indicated   3 g 200 mL/hr over 30 Minutes Intravenous Every 8 hours 08/15/18 1201 08/16/18 1439   08/14/18 1145  ceFAZolin (ANCEF) IVPB 2g/100 mL premix     2 g 200 mL/hr over 30 Minutes Intravenous On call to O.R. 08/14/18 1140 08/14/18 1238   08/14/18 1143  ceFAZolin (ANCEF) 2-4 GM/100ML-% IVPB    Note to Pharmacy:  Lisette Grinder   : cabinet override      08/14/18 1143 08/14/18 1238   08/13/18 1730  Ampicillin-Sulbactam (UNASYN) 3 g in sodium chloride 0.9 % 100 mL IVPB  Status:  Discontinued    Note to Pharmacy:  Pharmacy may adjust dose as indicated   3 g 200 mL/hr over 30 Minutes Intravenous Every 8 hours 08/13/18 1729 08/15/18 1201   08/12/18 2330  ceFEPIme (MAXIPIME) 2 g in sodium chloride 0.9 % 100 mL IVPB     2 g 200 mL/hr over 30 Minutes Intravenous  Once 08/12/18 2322 08/13/18 0035   08/12/18 2330  metroNIDAZOLE (FLAGYL) IVPB 500 mg  Status:  Discontinued     500 mg 100 mL/hr over 60 Minutes Intravenous Every 8 hours 08/12/18 2322 08/13/18 0048   08/12/18 2330  vancomycin (VANCOCIN) IVPB 1000 mg/200 mL premix     1,000 mg 200 mL/hr over 60 Minutes Intravenous  Once 08/12/18 2322 08/13/18 0125      Subjective: Patient seen and examined the bedside this morning.  Looks comfortable and better today.  More coherent and engaged in conversation.  Denied psychiatry evaluation.  Continues to deny going to rehab.  Acceptable for home with home health.   Objective: Vitals:   08/23/18 2000 08/23/18 2300 08/24/18 0300 08/24/18 0812  BP: 108/70 120/66 134/67 114/71  Pulse: 96 65 71 69  Resp: 17 17 18 18   Temp: 97.7 F (36.5 C) 98 F (36.7 C) 98.1 F (36.7 C) 98 F (36.7 C)  TempSrc: Oral Oral Oral Oral  SpO2: 96% 98% 96% 97%  Weight:      Height:        Intake/Output Summary (Last 24 hours) at 08/24/2018 1103 Last data filed at 08/24/2018 1000 Gross per 24 hour  Intake 1071.37 ml  Output 1300 ml  Net -228.63 ml   Filed Weights   08/16/18  0408 08/17/18 0500 08/18/18 0500  Weight: 67.1 kg 68.2 kg 64 kg    Examination:  General exam: Appears calm and comfortable ,Not in distress,average built HEENT:PERRL,Oral mucosa moist, Ear/Nose normal on gross exam Respiratory system: Bilateral decreased air entry on the bases Cardiovascular system: S1 & S2 heard, RRR. No JVD, murmurs, rubs, gallops or clicks. Gastrointestinal system: Abdomen is nondistended, soft and nontender. No organomegaly or masses felt. Normal bowel sounds heard. Central nervous system: Alert and oriented. No focal neurological deficits. Extremities: No edema, no clubbing ,no cyanosis, distal peripheral pulses palpable.  Left arm on sling. Skin: Bruises/ecchymosis on her right shoulder and right back MSK: Normal muscle bulk,tone ,power Psychiatry: Judgement and insight appear normal.   Data Reviewed: I have personally reviewed following labs and imaging studies  CBC: Recent Labs  Lab 08/18/18 0543 08/20/18 0300 08/22/18 0457 08/23/18 0405  WBC 3.4* 4.6 5.4 8.2  HGB 10.4* 10.2* 11.0* 11.7*  HCT 32.3* 32.1* 34.2* 36.1  MCV 107.0* 104.9* 104.3* 102.8*  PLT 206 240  295 470   Basic Metabolic Panel: Recent Labs  Lab 08/18/18 0543 08/20/18 0300 08/22/18 0457 08/22/18 1434 08/23/18 0405 08/24/18 0406  NA 137 136 137  --  136 137  K 3.5 3.2* 3.6  --  3.3* 3.1*  CL 91* 91* 94*  --  94* 96*  CO2 31 36* 35*  --  38* 31  GLUCOSE 102* 141* 93  --  95 93  BUN <5* 9 6*  --  <5* <5*  CREATININE 0.45 0.33* 0.33*  --  0.36* 0.34*  CALCIUM 8.4* 8.2* 8.5*  --  8.4* 8.5*  MG 1.3* 1.6* 1.4* 2.3 1.5* 1.7   GFR: Estimated Creatinine Clearance: 58.6 mL/min (A) (by C-G formula based on SCr of 0.34 mg/dL (L)). Liver Function Tests: Recent Labs  Lab 08/18/18 0543 08/20/18 0300 08/22/18 0457 08/23/18 0405 08/24/18 0406  AST 68* 123* 149* 206* 107*  ALT 44 78* 149* 214* 149*  ALKPHOS 82 81 86 91 84  BILITOT 1.4* 0.9 0.8 0.9 0.9  PROT 4.7* 4.5* 5.1* 5.3*  4.7*  ALBUMIN 2.2* 2.2* 2.6* 2.7* 2.5*   No results for input(s): LIPASE, AMYLASE in the last 168 hours. No results for input(s): AMMONIA in the last 168 hours. Coagulation Profile: No results for input(s): INR, PROTIME in the last 168 hours. Cardiac Enzymes: No results for input(s): CKTOTAL, CKMB, CKMBINDEX, TROPONINI in the last 168 hours. BNP (last 3 results) No results for input(s): PROBNP in the last 8760 hours. HbA1C: No results for input(s): HGBA1C in the last 72 hours. CBG: Recent Labs  Lab 08/20/18 0828 08/21/18 0757 08/22/18 0827 08/23/18 0804 08/24/18 0816  GLUCAP 99 100* 90 88 93   Lipid Profile: No results for input(s): CHOL, HDL, LDLCALC, TRIG, CHOLHDL, LDLDIRECT in the last 72 hours. Thyroid Function Tests: Recent Labs    08/24/18 0406  TSH 1.627   Anemia Panel: No results for input(s): VITAMINB12, FOLATE, FERRITIN, TIBC, IRON, RETICCTPCT in the last 72 hours. Sepsis Labs: No results for input(s): PROCALCITON, LATICACIDVEN in the last 168 hours.  No results found for this or any previous visit (from the past 240 hour(s)).       Radiology Studies: No results found.      Scheduled Meds: . busPIRone  10 mg Oral TID  . docusate  100 mg Oral BID  . escitalopram  10 mg Oral Daily  . feeding supplement (ENSURE ENLIVE)  237 mL Oral TID BM  . folic acid  1 mg Oral Daily  . lidocaine  1 patch Transdermal Q24H  . magnesium oxide  400 mg Oral BID  . mouth rinse  15 mL Mouth Rinse BID  . potassium chloride  40 mEq Oral BID  . potassium chloride  40 mEq Oral Once  . propranolol  10 mg Oral TID  . sodium chloride flush  3 mL Intravenous Q12H  . thiamine  100 mg Oral Daily   Continuous Infusions:    LOS: 11 days    Time spent: 35 mins.More than 50% of that time was spent in counseling and/or coordination of care.      Shelly Coss, MD [facility] Pager [phone]  If 7PM-7AM, please contact  night-coverage [email or web address] Password [facility] 08/24/2018, 11:03 AM

## 2018-08-24 NOTE — Progress Notes (Signed)
OT Cancellation Note  Patient Details Name: [name] MRN: [id number] DOB: [birth year]   Cancelled Treatment:    Reason Eval/Treat Not Completed: Patient declined, no reason specified.  Pt adamantly refused OT.  She states "I'll do it tomorrow"  Lucille Passy, OTR/L [place] Pager [phone] Office [phone]   Lucille Passy M 08/24/2018, 2:54 PM

## 2018-08-24 NOTE — Care Management Note (Signed)
Case Management Note  Patient Details  Name: [name] MRN: [id number] Date of Birth: 11-13-1950  Subjective/Objective:  Pt admitted on 08/13/2018 after being found down at home surrounded by wine bottles.  Pt found to have hypotension, proximal Lt humerus fx, macrocytic anemia, elevated LFTs, and ETOH withdrawal.  PTA, pt independent, lives alone.              Action/Plan: PT/OT recommending SNF, but pt adamantly refuses.  Per MD, she has capacity to make her own decisions.  Met with pt; she is agreeable to [facility] services at discharge.  She states she has "friends" that check on her, usually daily.  She denies need for any DME, and states she is not on any oxygen at home, and "does not need it."   Asked pt how she will manage at home, and she stated, "just like everyone else does."  Expected Discharge Date:                  Expected Discharge Plan:  [place]  In-House Referral:  Clinical Social Work  Discharge planning Services  CM Consult  Post Acute Care Choice:  Home Health Choice offered to:     DME Arranged:    DME Agency:     HH Arranged:  RN, PT, OT, Nurse's Aide, Social Work [employer] Agency:  [employer]  Status of Service:  Completed, signed off  If discussed at [employer], dates discussed:    Additional Comments:  08/23/18 J. Natanel Snavely, RN, BSN Referral to [facility] for [facility] follow up.  Pt denies DME needs.  She states she will have intermittent assistance at home, but will not name who this will be.    Reinaldo Raddle, RN, BSN  Trauma/Neuro ICU Case Manager [phone]

## 2018-08-24 NOTE — Social Work (Addendum)
1:41pm- CSW spoke with pt at bedside, CSW explained reason for revisit. Acknowledged that pt is still adamant about returning home, CSW inquired about who would pick pt up and pt states that "I will figure it out." Pt provided with mental health resource packet, explained that MD wanted me to provide her with that, asked where she would like CSW to leave packet and she stated "you can leave it there, or throw it in the trash." CSW left packet in the room for pt. Pt requested CSW to leave. Unable to schedule psychiatric f/u without permission.  11:40am- Will provide outpatient resources for counseling.  For home health please consult RN CM.  Westley Hummer, MSW, [place] [employer] [phone]

## 2018-08-24 NOTE — Progress Notes (Signed)
PT Cancellation Note  Patient Details Name: [name] MRN: [id number] DOB: [birth year]   Cancelled Treatment:    Reason Eval/Treat Not Completed: Patient declined, no reason specified. Pt agitated on PT arrival stating "I already told them I'm not working with you today. I'll do it tomorrow." PT to re-attempt tomorrow.   Lorriane Shire 08/24/2018, 10:53 AM   Lorrin Goodell, PT  Office # [phone] Pager [phone]

## 2018-08-25 LAB — BASIC METABOLIC PANEL
Anion gap: 6 (ref 5–15)
BUN: 7 mg/dL — ABNORMAL LOW (ref 8–23)
CO2: 29 mmol/L (ref 22–32)
Calcium: 8.3 mg/dL — ABNORMAL LOW (ref 8.9–10.3)
Chloride: 102 mmol/L (ref 98–111)
Creatinine, Ser: 0.35 mg/dL — ABNORMAL LOW (ref 0.44–1.00)
GFR calc Af Amer: >60 mL/min (ref >60)
Glucose, Bld: 81 mg/dL (ref 70–99)
Potassium: 3.1 mmol/L — ABNORMAL LOW (ref 3.5–5.1)
Sodium: 137 mmol/L (ref 135–145)

## 2018-08-25 LAB — GLUCOSE, CAPILLARY
Glucose-Capillary: 69 mg/dL — ABNORMAL LOW (ref 70–99)
Glucose-Capillary: 86 mg/dL (ref 70–99)

## 2018-08-25 MED ORDER — BUSPIRONE HCL 10 MG PO TABS: 10.0000 mg | ORAL_TABLET | Freq: Three times a day (TID) | ORAL | 0 refills | Status: DC

## 2018-08-25 MED ORDER — POTASSIUM CHLORIDE 20 MEQ/15ML (10%) PO SOLN: 20.0000 meq | Freq: Every day | ORAL | 0 refills | Status: DC

## 2018-08-25 MED ORDER — TRAMADOL HCL 50 MG PO TABS: 50.0000 mg | ORAL_TABLET | Freq: Four times a day (QID) | ORAL | 0 refills | Status: DC | PRN

## 2018-08-25 MED ORDER — ESCITALOPRAM OXALATE 10 MG PO TABS: 10.0000 mg | ORAL_TABLET | Freq: Every day | ORAL | 0 refills | Status: DC

## 2018-08-25 MED ORDER — POTASSIUM CHLORIDE 20 MEQ/15ML (10%) PO SOLN
40.0000 meq | Freq: Once | ORAL | Status: AC
  Administered 2018-08-25: 40 meq via ORAL
  Filled 2018-08-25: qty 30

## 2018-08-25 MED ORDER — THIAMINE HCL 100 MG PO TABS: 100.0000 mg | ORAL_TABLET | Freq: Every day | ORAL | 0 refills | Status: DC

## 2018-08-25 MED ORDER — FOLIC ACID 1 MG PO TABS: 1.0000 mg | ORAL_TABLET | Freq: Every day | ORAL | 0 refills | Status: DC

## 2018-08-25 MED ORDER — MAGNESIUM OXIDE 400 (241.3 MG) MG PO TABS: 400.0000 mg | ORAL_TABLET | Freq: Two times a day (BID) | ORAL | 0 refills | Status: DC

## 2018-08-25 MED ORDER — POTASSIUM CHLORIDE CRYS ER 20 MEQ PO TBCR: 40.0000 meq | EXTENDED_RELEASE_TABLET | Freq: Once | ORAL | Status: DC

## 2018-08-25 NOTE — Consult Note (Signed)
Attempted to interview [name] but after introducing self as a psychiatrist she declined to speak further. She reports that she requested not to speak to a psychiatrist and is unsure why the consult was placed. Please consult psychiatry in the future if patient changes her mind or has psychiatric needs.   Buford Dresser, DO 08/25/18 3:07 PM

## 2018-08-25 NOTE — Discharge Summary (Addendum)
Physician Discharge Summary  DEIDREA GAETZ NUU:[id number] DOB: Apr 19, 1951 DOA: 08/12/2018  PCP: Wenda Low, MD  Admit date: 08/12/2018 Discharge date: 08/26/18 Admitted From: Home Disposition:  Home  Discharge Condition:Stable CODE STATUS:FULL Diet recommendation:Regular   Brief/Interim Summary: Patient is a 68 year old female with past medical history of hypertension, anxiety, depression, right lung cancer status post right lower lobe lobectomy and chemotherapy interventions have and was brought to the emergency department after she was found on the floor surrounded by multiple wine bottles and soiled in feces and urine.  Reported that she had fallen on 08/10/2018 but was unable to get up.  CT imagings were negative for acute intracranial abnormality or spine pathology.  Radiograph of the left shoulder revealed angulated humeral neck fracture with possible extension to the humeral head.  Underwent reverse left shoulder arthroplasty with tuberosity repair by orthopedics on 08/14/2018.  Physical therapy recommended skilled facility on discharge but she is denying.  We tried consulted psychiatry today for her severe anxiety and depression but she denies.Despite several discussions and counseling, she denies going to skilled nursing facility.  Plan is to discharge her home with home health today. Due to her alcoholism, noncompliance she has high risk for readmission in the future.  Following problems were addressed during her hospitalization:  Proximal left humeral fracture:  Underwent reverse left shoulder arthroplasty with tuberosity repair by orthopedics on 08/14/2018.  Physical therapy recommended skilled facility on discharge but she is denying. Currently pain looks well controlled.  She will follow-up with orthopedics on discharge.  Continue pain management.  Status post fall: Seen by physical therapy and recommended skilled services on discharge.  She is denying.  She will be  discharged to home  with home health .Patient lives alone at home.  Hypotension: Resolved  Left basilar pneumonia: Suspected aspiration.  Was in acute hypoxic respiratory failure earlier.  Respiratory status is improved.  She completed the  antibiotics course.  Sinus tachycardia: Much improved today.  IV fluids discontinued  Chronic alcohol abuse: Continue thiamine, folic acid.    Counseled for cessation.  Depression/anxiety: On buspirone and Lexapro. I  Discussed about  psychiatric evaluation but she denied.She needs follow up with psychiatry as an outpatient through the referral of her PCP.  Hypokalemia/hypomagnesemia: Being supplemented.  Check BMP in a week.  Transaminitis: Patient has history of chronic alcohol abuse.  Also was hypotensive on this hospitalization.  Hepatitis panel negative.Check LFTs in 1-2 months.  Macrocytic anemia: Continue folic acid.  B12 and folate normal.  She was transfused 1 unit PRBC during this hospitalization.  Currently hemoglobin stable.  COPD: Currently stable.She is on oxygen at home as needed.We checked chest x-ray today for her persistent requirement of oxygen supplementation, it did not show any pneumonia.  I have requested care manager to arrange oxygen at home.  Discharge Diagnoses:  Principal Problem:   Alcohol dependence with withdrawal ([place]) Active Problems:   COPD pfts pending/ active smoker   Closed left humeral fracture   Elevated LFTs   Hypokalemia   Macrocytic anemia   Hypotension due to hypovolemia   Closed fracture of left humerus   Malnutrition of moderate degree   H/O total shoulder replacement, left    Discharge Instructions  Discharge Instructions    Diet - low sodium heart healthy   Complete by:  As directed    Discharge instructions   Complete by:  As directed    1) Please follow-up with orthopedics as an outpatient in 2 weeks.  Name  and number the provider has been attached. 2) Take prescribed medications as instructed. 3)  Follow up with your PCP in a week.  Do a CBC, BMP test during the follow-up. 4)Follow up with home health. 5) Quit alcohol.   Increase activity slowly   Complete by:  As directed      Allergies as of 08/25/2018      Reactions   Tape Other (See Comments)   Patient PREFERS paper tape      Medication List    TAKE these medications   atorvastatin 10 MG tablet Commonly known as:  LIPITOR Take 10 mg by mouth daily.   busPIRone 10 MG tablet Commonly known as:  BUSPAR Take 1 tablet (10 mg total) by mouth 3 (three) times daily.   escitalopram 10 MG tablet Commonly known as:  LEXAPRO Take 1 tablet (10 mg total) by mouth daily.   folic acid 1 MG tablet Commonly known as:  FOLVITE Take 1 tablet (1 mg total) by mouth daily. Start taking on:  August 26, 2018   hydrocortisone-pramoxine 2.5-1 % rectal cream Commonly known as:  ANALPRAM-HC PLACE RECTALLY 4 (FOUR) TIMES DAILY. FOR IRRITATED AND PAINFUL HEMORRHOIDS What changed:  See the new instructions.   magnesium oxide 400 (241.3 Mg) MG tablet Commonly known as:  MAG-OX Take 1 tablet (400 mg total) by mouth 2 (two) times daily.   multivitamin tablet Take 1 tablet by mouth daily.   potassium chloride 20 MEQ/15ML (10%) Soln Take 15 mLs (20 mEq total) by mouth daily for 14 doses. Start taking on:  August 26, 2018   [facility] HFA 108 (90 Base) MCG/ACT inhaler Generic drug:  albuterol Inhale 2 puffs into the lungs every 4 (four) hours as needed.   thiamine 100 MG tablet Take 1 tablet (100 mg total) by mouth daily. Start taking on:  August 26, 2018   traMADol 50 MG tablet Commonly known as:  ULTRAM Take 1 tablet (50 mg total) by mouth every 6 (six) hours as needed for moderate pain.      Follow-up Information    Netta Cedars, MD. Call in 2 weeks.   Specialty:  Orthopedic Surgery Why:  [phone] Contact information: [address] [place] [address] [phone]        [facility] [place] Follow up.   Why:  Nurse, physical therapy, occupational therapy, home health aide and social worker to follow up with you at home Contact information: [place] [address] [phone]        Wenda Low, MD. Schedule an appointment as soon as possible for a visit in 1 week(s).   Specialty:  Internal Medicine Contact information: [address], [address] [place] [address] [phone]          Allergies  Allergen Reactions  . Tape Other (See Comments)    Patient PREFERS paper tape    Consultations:  none   Procedures/Studies: Ct Head Wo Contrast  Result Date: 08/12/2018 CLINICAL DATA:  Fall on Thursday EXAM: CT HEAD WITHOUT CONTRAST CT CERVICAL SPINE WITHOUT CONTRAST TECHNIQUE: Multidetector CT imaging of the head and cervical spine was performed following the standard protocol without intravenous contrast. Multiplanar CT image reconstructions of the cervical spine were also generated. COMPARISON:  CT brain 06/06/2018 FINDINGS: CT HEAD FINDINGS Brain: No acute territorial infarction, hemorrhage, or intracranial mass. Mild to moderate atrophy. Mild small vessel ischemic changes of the white matter. Stable ventricle size. Vascular: No hyperdense vessels.  Scattered calcifications at the carotid siphon. Skull: No fracture Sinuses/Orbits: No acute finding. Other: None CT CERVICAL SPINE FINDINGS Alignment: No subluxation.  Facet alignment is normal Skull base and vertebrae: No acute fracture. No primary bone lesion or focal pathologic process. Soft tissues and spinal canal: No prevertebral fluid or swelling. No visible canal hematoma. Disc levels:  Mild degenerative changes C4-C5. Upper chest: Negative. Other: Apical emphysema. IMPRESSION: 1. No CT evidence for acute intracranial abnormality. Atrophy and mild small vessel ischemic changes of the white matter. 2. Mild degenerative changes.  No acute osseous abnormality. Electronically Signed   By:  Donavan Foil M.D.   On: 08/12/2018 20:44   Ct Chest W Contrast  Result Date: 08/13/2018 CLINICAL DATA:  Fall. EXAM: CT CHEST, ABDOMEN, AND PELVIS WITH CONTRAST TECHNIQUE: Multidetector CT imaging of the chest, abdomen and pelvis was performed following the standard protocol during bolus administration of intravenous contrast. CONTRAST:  170mL OMNIPAQUE IOHEXOL 300 MG/ML  SOLN COMPARISON:  PET-CT 06/21/2018 FINDINGS: CT CHEST FINDINGS Cardiovascular: Normal heart size. No pericardial effusions. Normal caliber thoracic aorta. No dissection. Great vessel origins are patent. Central pulmonary arteries are well opacified. Mediastinum/Nodes: Esophagus is decompressed. No significant lymphadenopathy in the chest. Surgical clips in the right hilum and right para esophageal region. Lungs/Pleura: Emphysematous changes in the lungs. Postoperative partial right lung resection with scarring in the posteromedial right lung, possibly postoperative or possibly post radiation. No consolidation or edema. No pleural effusions. No pneumothorax. There is a somewhat lobulated nodule in the left lower lung posteriorly, measuring 12 mm diameter. Size is similar to previous PET-CT scan. The PET CT suggested low metabolic activity suspicious for a low-grade tumor. No new lesions. Musculoskeletal: Acute appearing comminuted fractures of the left humeral head and neck with displacement of the fracture fragments. No glenohumeral dislocation. Prominent surrounding soft tissue edema and/or contusion. Old fracture deformity of the right shoulder. No acute displaced rib fractures are demonstrated. Degenerative changes in the spine. Thoracic scoliosis convex towards the right. CT ABDOMEN PELVIS FINDINGS Hepatobiliary: Diffuse fatty infiltration of the liver. No focal lesions. Gallbladder and bile ducts are unremarkable. Pancreas: Unremarkable. No pancreatic ductal dilatation or surrounding inflammatory changes. Spleen: Normal in size without  focal abnormality. Adrenals/Urinary Tract: No adrenal gland nodules. Several stones in the lower poles of both kidneys. Largest is on the left and measures about 3 mm diameter. No hydronephrosis or hydroureter. Nephrograms are symmetrical. Bladder is unremarkable. Stomach/Bowel: Stomach, small bowel, and colon are not abnormally distended. Diverticulosis throughout the colon without evidence of diverticulitis. Scattered stool throughout the colon. Appendix is normal. Vascular/Lymphatic: Aortic atherosclerosis. No enlarged abdominal or pelvic lymph nodes. Reproductive: Uterus and bilateral adnexa are unremarkable. Other: No abdominal wall hernia or abnormality. No abdominopelvic ascites. Musculoskeletal: Lumbar scoliosis convex towards the left. Degenerative changes in the spine. No destructive bone lesions. IMPRESSION: 1. Acute appearing comminuted fractures of the left humeral head and neck with surrounding soft tissue edema and/or contusion. 2. 12 mm nodule in the left lower lung is similar to previous PET-CT scan. See PET-CT report, suggesting possible low-grade malignancy. 3. Postoperative changes in the right lung. No evidence of active pulmonary disease. 4. Diffuse fatty infiltration of the liver. 5. Nonobstructing stones in both kidneys. 6. Colonic diverticulosis without evidence of diverticulitis. Aortic Atherosclerosis (ICD10-I70.0) and Emphysema (ICD10-J43.9). Electronically Signed   By: Lucienne Capers M.D.   On: 08/13/2018 02:39   Ct Cervical Spine Wo Contrast  Result Date: 08/12/2018 CLINICAL DATA:  Fall on Thursday EXAM: CT  HEAD WITHOUT CONTRAST CT CERVICAL SPINE WITHOUT CONTRAST TECHNIQUE: Multidetector CT imaging of the head and cervical spine was performed following the standard protocol without intravenous contrast. Multiplanar CT image reconstructions of the cervical spine were also generated. COMPARISON:  CT brain 06/06/2018 FINDINGS: CT HEAD FINDINGS Brain: No acute territorial infarction,  hemorrhage, or intracranial mass. Mild to moderate atrophy. Mild small vessel ischemic changes of the white matter. Stable ventricle size. Vascular: No hyperdense vessels. Scattered calcifications at the carotid siphon. Skull: No fracture Sinuses/Orbits: No acute finding. Other: None CT CERVICAL SPINE FINDINGS Alignment: No subluxation.  Facet alignment is normal Skull base and vertebrae: No acute fracture. No primary bone lesion or focal pathologic process. Soft tissues and spinal canal: No prevertebral fluid or swelling. No visible canal hematoma. Disc levels:  Mild degenerative changes C4-C5. Upper chest: Negative. Other: Apical emphysema. IMPRESSION: 1. No CT evidence for acute intracranial abnormality. Atrophy and mild small vessel ischemic changes of the white matter. 2. Mild degenerative changes.  No acute osseous abnormality. Electronically Signed   By: Donavan Foil M.D.   On: 08/12/2018 20:44   Ct Abdomen Pelvis W Contrast  Result Date: 08/13/2018 CLINICAL DATA:  Fall. EXAM: CT CHEST, ABDOMEN, AND PELVIS WITH CONTRAST TECHNIQUE: Multidetector CT imaging of the chest, abdomen and pelvis was performed following the standard protocol during bolus administration of intravenous contrast. CONTRAST:  122mL OMNIPAQUE IOHEXOL 300 MG/ML  SOLN COMPARISON:  PET-CT 06/21/2018 FINDINGS: CT CHEST FINDINGS Cardiovascular: Normal heart size. No pericardial effusions. Normal caliber thoracic aorta. No dissection. Great vessel origins are patent. Central pulmonary arteries are well opacified. Mediastinum/Nodes: Esophagus is decompressed. No significant lymphadenopathy in the chest. Surgical clips in the right hilum and right para esophageal region. Lungs/Pleura: Emphysematous changes in the lungs. Postoperative partial right lung resection with scarring in the posteromedial right lung, possibly postoperative or possibly post radiation. No consolidation or edema. No pleural effusions. No pneumothorax. There is a somewhat  lobulated nodule in the left lower lung posteriorly, measuring 12 mm diameter. Size is similar to previous PET-CT scan. The PET CT suggested low metabolic activity suspicious for a low-grade tumor. No new lesions. Musculoskeletal: Acute appearing comminuted fractures of the left humeral head and neck with displacement of the fracture fragments. No glenohumeral dislocation. Prominent surrounding soft tissue edema and/or contusion. Old fracture deformity of the right shoulder. No acute displaced rib fractures are demonstrated. Degenerative changes in the spine. Thoracic scoliosis convex towards the right. CT ABDOMEN PELVIS FINDINGS Hepatobiliary: Diffuse fatty infiltration of the liver. No focal lesions. Gallbladder and bile ducts are unremarkable. Pancreas: Unremarkable. No pancreatic ductal dilatation or surrounding inflammatory changes. Spleen: Normal in size without focal abnormality. Adrenals/Urinary Tract: No adrenal gland nodules. Several stones in the lower poles of both kidneys. Largest is on the left and measures about 3 mm diameter. No hydronephrosis or hydroureter. Nephrograms are symmetrical. Bladder is unremarkable. Stomach/Bowel: Stomach, small bowel, and colon are not abnormally distended. Diverticulosis throughout the colon without evidence of diverticulitis. Scattered stool throughout the colon. Appendix is normal. Vascular/Lymphatic: Aortic atherosclerosis. No enlarged abdominal or pelvic lymph nodes. Reproductive: Uterus and bilateral adnexa are unremarkable. Other: No abdominal wall hernia or abnormality. No abdominopelvic ascites. Musculoskeletal: Lumbar scoliosis convex towards the left. Degenerative changes in the spine. No destructive bone lesions. IMPRESSION: 1. Acute appearing comminuted fractures of the left humeral head and neck with surrounding soft tissue edema and/or contusion. 2. 12 mm nodule in the left lower lung is similar to previous PET-CT scan. See PET-CT  report, suggesting  possible low-grade malignancy. 3. Postoperative changes in the right lung. No evidence of active pulmonary disease. 4. Diffuse fatty infiltration of the liver. 5. Nonobstructing stones in both kidneys. 6. Colonic diverticulosis without evidence of diverticulitis. Aortic Atherosclerosis (ICD10-I70.0) and Emphysema (ICD10-J43.9). Electronically Signed   By: Lucienne Capers M.D.   On: 08/13/2018 02:39   Dg Chest Port 1 View  Result Date: 08/17/2018 CLINICAL DATA:  Hypoxia.  Recent humerus surgery. EXAM: PORTABLE CHEST 1 VIEW COMPARISON:  Two days ago FINDINGS: Postoperative changes including volume loss on the right. There is new airspace opacity in the right lung. Chronic scar-like appearance at the right apex. Haziness at the left base favoring atelectasis. Normal heart size. IMPRESSION: Airspace disease in the postoperative right lung, likely pneumonia or aspiration. Electronically Signed   By: Monte Fantasia M.D.   On: 08/17/2018 09:48   Dg Chest Port 1 View  Result Date: 08/15/2018 CLINICAL DATA:  Cough and pneumonia EXAM: PORTABLE CHEST 1 VIEW COMPARISON:  08/13/2018 FINDINGS: Cardiac shadow is stable. Elevation of the right hemidiaphragm is again identified and stable. Stable blunting of the right costophrenic angle is seen. Postsurgical changes are again noted on right. Persistent increased density in the left base is noted stable from the previous exam. Postsurgical changes in the right shoulder are noted new from the prior exam. No acute bony abnormality is seen. Stable scoliosis is noted. IMPRESSION: Stable increased density in the left base as described. Interval shoulder replacement Electronically Signed   By: Inez Catalina M.D.   On: 08/15/2018 08:04   Dg Chest Port 1 View  Result Date: 08/13/2018 CLINICAL DATA:  Tachypnea. EXAM: PORTABLE CHEST 1 VIEW COMPARISON:  Chest radiograph 08/12/2018 and CT 08/13/2018 FINDINGS: The cardiomediastinal silhouette is unchanged. Postoperative changes are  again seen from right lower lobectomy and right upper lobe wedge resection with chronic volume loss in the right hemithorax and elevation of the right hemidiaphragm. There is underlying emphysema. Right basilar atelectasis or scarring is noted. There is new mild heterogeneous opacity in the left lung base. No definite pleural effusion or pneumothorax is identified. A proximal left humerus fracture is again noted, and there is thoracolumbar scoliosis. IMPRESSION: New left lung base opacity which could reflect pneumonia or aspiration and atelectasis. Electronically Signed   By: Logan Bores M.D.   On: 08/13/2018 16:25   Dg Chest Port 1 View  Result Date: 08/12/2018 CLINICAL DATA:  Acute RIGHT chest pain following fall. EXAM: PORTABLE CHEST 1 VIEW COMPARISON:  06/21/2018 PET CT, 10/13/2017 chest radiograph and other studies FINDINGS: The cardiomediastinal silhouette is unchanged. RIGHT thoracic surgical changes and scarring again noted. There is no evidence of focal airspace disease, pulmonary edema, suspicious pulmonary nodule/mass, pleural effusion, or pneumothorax. No acute bony abnormalities are identified. Thoracic scoliosis again identified. IMPRESSION: No evidence of acute cardiopulmonary disease. Electronically Signed   By: Margarette Canada M.D.   On: 08/12/2018 19:48   Dg Shoulder Left  Result Date: 08/12/2018 CLINICAL DATA:  Acute LEFT shoulder pain following fall. Initial encounter. EXAM: LEFT SHOULDER - 2+ VIEW COMPARISON:  None. FINDINGS: A humeral neck fracture is identified with equivocal extension into the head. Apex MEDIAL/anterior angulation is noted. The humeral head appears located. IMPRESSION: Angulated humeral neck fracture which may extend into the head. Electronically Signed   By: Margarette Canada M.D.   On: 08/12/2018 19:50   Dg Shoulder Left Port  Result Date: 08/14/2018 CLINICAL DATA:  Status post left shoulder replacement. EXAM: LEFT SHOULDER -  1 VIEW COMPARISON:  None. FINDINGS: Status post  left shoulder replacement. Hardware is in good position on a single frontal view. IMPRESSION: Left shoulder replacement as above. Electronically Signed   By: Dorise Bullion III M.D   On: 08/14/2018 16:51   Vas [place] Lower Extremity Venous (dvt)  Result Date: 08/16/2018  Lower Venous Study Indications: Rule out DVT. Other Indications: History of DVT; Tachycardia. Risk Factors: Chemotherapy Cancer Lung cancer. Limitations: Body habitus and Edema; patient's immobility. Comparison Study: No prior study available. Performing Technologist: Rudell Cobb  Examination Guidelines: A complete evaluation includes B-mode imaging, spectral Doppler, color Doppler, and power Doppler as needed of all accessible portions of each vessel. Bilateral testing is considered an integral part of a complete examination. Limited examinations for reoccurring indications may be performed as noted.  Right Venous Findings: +---------+---------------+---------+-----------+----------+-------------------+          CompressibilityPhasicitySpontaneityPropertiesSummary             +---------+---------------+---------+-----------+----------+-------------------+ CFV      Full           Yes      Yes                                      +---------+---------------+---------+-----------+----------+-------------------+ SFJ      Full                                                             +---------+---------------+---------+-----------+----------+-------------------+ FV Prox  Full                                                             +---------+---------------+---------+-----------+----------+-------------------+ FV Mid   Full                                                             +---------+---------------+---------+-----------+----------+-------------------+ FV DistalFull                                                              +---------+---------------+---------+-----------+----------+-------------------+ PFV      Full                                                             +---------+---------------+---------+-----------+----------+-------------------+ POP      Full           Yes      Yes                                      +---------+---------------+---------+-----------+----------+-------------------+  PTV      Full                                                             +---------+---------------+---------+-----------+----------+-------------------+ PERO                                                  not well visualized +---------+---------------+---------+-----------+----------+-------------------+  Left Venous Findings: +---------+---------------+---------+-----------+----------+-------------------+          CompressibilityPhasicitySpontaneityPropertiesSummary             +---------+---------------+---------+-----------+----------+-------------------+ CFV      Full           Yes      Yes                                      +---------+---------------+---------+-----------+----------+-------------------+ SFJ      Full                                                             +---------+---------------+---------+-----------+----------+-------------------+ FV Prox  Full                                                             +---------+---------------+---------+-----------+----------+-------------------+ FV Mid   Full                                                             +---------+---------------+---------+-----------+----------+-------------------+ FV DistalFull                                                             +---------+---------------+---------+-----------+----------+-------------------+ PFV      Full                                                              +---------+---------------+---------+-----------+----------+-------------------+ POP      Full           Yes      Yes                                      +---------+---------------+---------+-----------+----------+-------------------+  PTV                                                   not well visualized                                                       due to edema        +---------+---------------+---------+-----------+----------+-------------------+ PERO                                                  not well visualized                                                       due to edema        +---------+---------------+---------+-----------+----------+-------------------+    Summary: Right: There is no evidence of deep vein thrombosis in the lower extremity. However, portions of this examination were limited- see technologist comments above. No cystic structure found in the popliteal fossa. Left: There is no evidence of deep vein thrombosis in the lower extremity. However, portions of this examination were limited- see technologist comments above. No cystic structure found in the popliteal fossa.  *See table(s) above for measurements and observations. Electronically signed by Curt Jews MD on 08/16/2018 at 9:40:48 PM.    Final        Subjective: Patient seen and examined at the bed side this morning.  Hemodynamically stable.  Looks comfortable.  Denied discharge to SNF.  Discharge Exam: Vitals:   08/25/18 0740 08/25/18 1017  BP: 129/73 121/78  Pulse: 75 89  Resp: (!) 25   Temp: (!) 97.5 F (36.4 C)   SpO2: 91%    Vitals:   08/24/18 2000 08/25/18 0600 08/25/18 0740 08/25/18 1017  BP: 124/65  129/73 121/78  Pulse: 66  75 89  Resp:   (!) 25   Temp: 98 F (36.7 C)  (!) 97.5 F (36.4 C)   TempSrc: Oral  Axillary   SpO2: 97%  91%   Weight:  59.2 kg    Height:        General: Pt is alert, awake, not in acute distress Cardiovascular: RRR, S1/S2  +, no rubs, no gallops Respiratory: CTA bilaterally, no wheezing, no rhonchi Abdominal: Soft, NT, ND, bowel sounds + Extremities: no edema, no cyanosis    The results of significant diagnostics from this hospitalization (including imaging, microbiology, ancillary and laboratory) are listed below for reference.     Microbiology: No results found for this or any previous visit (from the past 240 hour(s)).   Labs: BNP (last 3 results) Recent Labs    08/17/18 1558  BNP 741.2*   Basic Metabolic Panel: Recent Labs  Lab 08/20/18 0300 08/22/18 0457 08/22/18 1434 08/23/18 0405 08/24/18 0406 08/25/18 0344  NA 136 137  --  136 137 137  K 3.2* 3.6  --  3.3* 3.1*  3.1*  CL 91* 94*  --  94* 96* 102  CO2 36* 35*  --  38* 31 29  GLUCOSE 141* 93  --  95 93 81  BUN 9 6*  --  <5* <5* 7*  CREATININE 0.33* 0.33*  --  0.36* 0.34* 0.35*  CALCIUM 8.2* 8.5*  --  8.4* 8.5* 8.3*  MG 1.6* 1.4* 2.3 1.5* 1.7  --    Liver Function Tests: Recent Labs  Lab 08/20/18 0300 08/22/18 0457 08/23/18 0405 08/24/18 0406  AST 123* 149* 206* 107*  ALT 78* 149* 214* 149*  ALKPHOS 81 86 91 84  BILITOT 0.9 0.8 0.9 0.9  PROT 4.5* 5.1* 5.3* 4.7*  ALBUMIN 2.2* 2.6* 2.7* 2.5*   No results for input(s): LIPASE, AMYLASE in the last 168 hours. No results for input(s): AMMONIA in the last 168 hours. CBC: Recent Labs  Lab 08/20/18 0300 08/22/18 0457 08/23/18 0405  WBC 4.6 5.4 8.2  HGB 10.2* 11.0* 11.7*  HCT 32.1* 34.2* 36.1  MCV 104.9* 104.3* 102.8*  PLT 240 295 350   Cardiac Enzymes: No results for input(s): CKTOTAL, CKMB, CKMBINDEX, TROPONINI in the last 168 hours. BNP: Invalid input(s): POCBNP CBG: Recent Labs  Lab 08/22/18 0827 08/23/18 0804 08/24/18 0816 08/25/18 0749 08/25/18 0827  GLUCAP 90 88 93 69* 86   D-Dimer No results for input(s): DDIMER in the last 72 hours. Hgb A1c No results for input(s): HGBA1C in the last 72 hours. Lipid Profile No results for input(s): CHOL, HDL,  LDLCALC, TRIG, CHOLHDL, LDLDIRECT in the last 72 hours. Thyroid function studies Recent Labs    08/24/18 0406  TSH 1.627   Anemia work up No results for input(s): VITAMINB12, FOLATE, FERRITIN, TIBC, IRON, RETICCTPCT in the last 72 hours. Urinalysis    Component Value Date/Time   COLORURINE YELLOW 08/12/2018 1857   APPEARANCEUR CLEAR 08/12/2018 1857   LABSPEC 1.019 08/12/2018 1857   PHURINE 5.0 08/12/2018 1857   GLUCOSEU NEGATIVE 08/12/2018 1857   HGBUR NEGATIVE 08/12/2018 1857   BILIRUBINUR NEGATIVE 08/12/2018 1857   KETONESUR 80 (A) 08/12/2018 1857   PROTEINUR 30 (A) 08/12/2018 1857   NITRITE NEGATIVE 08/12/2018 1857   LEUKOCYTESUR NEGATIVE 08/12/2018 1857   Sepsis Labs Invalid input(s): PROCALCITONIN,  WBC,  LACTICIDVEN Microbiology No results found for this or any previous visit (from the past 240 hour(s)).  Please note: You were cared for by a hospitalist during your hospital stay. Once you are discharged, your primary care physician will handle any further medical issues. Please note that NO REFILLS for any discharge medications will be authorized once you are discharged, as it is imperative that you return to your primary care physician (or establish a relationship with a primary care physician if you do not have one) for your post hospital discharge needs so that they can reassess your need for medications and monitor your lab values.    Time coordinating discharge: 40 minutes  SIGNED:   Shelly Coss, MD  [facility] 08/25/2018, 11:16 AM Pager [phone]  If 7PM-7AM, please contact night-coverage [email or web address] Password [email or web address]

## 2018-08-25 NOTE — Progress Notes (Signed)
Physical Therapy Treatment Patient Details Name: [name] MRN: [id number] DOB: 1951/06/13 Today's Date: 08/25/2018    History of Present Illness Pt is a 68 y.o. female with medical history significant for hypertension, anxiety, cancer of the right lung status post chemotherapy, now presenting to the emergency department after she was found down on the floor, reporting that she had fallen on 08/10/2018 and was unable to get up.  S/p L reverse total shoulder arthroplasty on 08/14/18  after finding L displaced proximal humerus fracture.  CIWA protocol.      PT Comments    Pt received in bed. Willing to participate in therapy but only for mobility in room.  She required mod assist bed mobility, mod assist transfers and mod/HHA +2 safety ambulation 15 feet x 2. Pt very direct in her desire to not use portable O2. Pt desat to 77% on RA while in bathroom. 2 L O2 replaced with sat increase to 88%. Pt very unrealistic in her expectations for returning home alone. She required cues to maintain NWB LUE. She demonstrates poor safety awareness and high risk for falls. PT continues to recommend SNF but pt refusing. If pt d/c's home, she will need to maximize HHPT. Also recommending cane for home.   Follow Up Recommendations  SNF(adamantly refusing)     Equipment Recommendations  Cane    Recommendations for Other Services       Precautions / Restrictions Precautions Precautions: Shoulder;Fall Type of Shoulder Precautions: no shoulder ROM, active ROM elbow/wrist/hand  Shoulder Interventions: Shoulder sling/immobilizer;At all times;Off for dressing/bathing/exercises Precaution Comments: NWB and sling at all times.  Required Braces or Orthoses: Sling Restrictions Weight Bearing Restrictions: Yes LUE Weight Bearing: Non weight bearing    Mobility  Bed Mobility Overal bed mobility: Needs Assistance Bed Mobility: Supine to Sit;Sit to Supine     Supine to sit: Mod assist;HOB elevated Sit to  supine: Mod assist;HOB elevated   General bed mobility comments: cues for sequencing and NWB LUE. assist to elevate trunk  Transfers Overall transfer level: Needs assistance Equipment used: 1 person hand held assist Transfers: Sit to/from Stand Sit to Stand: Mod assist Stand pivot transfers: Mod assist       General transfer comment: assist to power up and stabilize balance  Ambulation/Gait Ambulation/Gait assistance: Mod assist;+2 safety/equipment Gait Distance (Feet): 15 Feet(x 2) Assistive device: 1 person hand held assist Gait Pattern/deviations: Step-through pattern;Decreased stride length Gait velocity: decreased Gait velocity interpretation: <1.8 ft/sec, indicate of risk for recurrent falls General Gait Details: assist to maintain balance. Pt refusing ambulation in hallway. Attempted removal of O2 with desat to 77% on RA. 2 L O2 replaced with SpO2 88% during mobility and 95% in bed at rest.    Stairs             Wheelchair Mobility    Modified Rankin (Stroke Patients Only)       Balance Overall balance assessment: Needs assistance Sitting-balance support: Single extremity supported;Feet supported Sitting balance-Leahy Scale: Fair     Standing balance support: Single extremity supported;During functional activity Standing balance-Leahy Scale: Poor Standing balance comment: reliant on external support                            Cognition Arousal/Alertness: Awake/alert Behavior During Therapy: Flat affect;Impulsive Overall Cognitive Status: Impaired/Different from baseline Area of Impairment: Safety/judgement;Awareness;Problem solving;Attention  Current Attention Level: Sustained Memory: Decreased short-term memory;Decreased recall of precautions Following Commands: Follows one step commands inconsistently Safety/Judgement: Decreased awareness of safety;Decreased awareness of deficits Awareness: Emergent Problem  Solving: Slow processing;Difficulty sequencing;Requires verbal cues;Requires tactile cues General Comments: Pt with unrealistic expectations and poor awareness of deficits.      Exercises Donning/doffing shirt without moving shoulder: Maximal assistance(unable to maintain) Method for sponge bathing under operated UE: Maximal assistance Donning/doffing sling/immobilizer: Maximal assistance(no recall) ROM for elbow, wrist and digits of operated UE: Moderate assistance    General Comments General comments (skin integrity, edema, etc.): BP maintained but oxygen decreased with all activity. pt very direct in her wishes to not have oxygen on her or a tank.       Pertinent Vitals/Pain Pain Assessment: Faces Faces Pain Scale: Hurts a little bit Pain Location: LUE Pain Descriptors / Indicators: Grimacing Pain Intervention(s): Monitored during session;Repositioned    Home Living                      Prior Function            PT Goals (current goals can now be found in the care plan section) Acute Rehab PT Goals Patient Stated Goal: to go home PT Goal Formulation: With patient Time For Goal Achievement: 08/30/18 Potential to Achieve Goals: Fair Progress towards PT goals: Progressing toward goals    Frequency    Min 3X/week      PT Plan Current plan remains appropriate    Co-evaluation PT/OT/SLP Co-Evaluation/Treatment: Yes Reason for Co-Treatment: For patient/therapist safety;To address functional/ADL transfers PT goals addressed during session: Mobility/safety with mobility;Balance        AM-PAC PT "6 Clicks" Mobility   Outcome Measure  Help needed turning from your back to your side while in a flat bed without using bedrails?: A Lot Help needed moving from lying on your back to sitting on the side of a flat bed without using bedrails?: A Lot Help needed moving to and from a bed to a chair (including a wheelchair)?: A Lot Help needed standing up from a chair  using your arms (e.g., wheelchair or bedside chair)?: A Lot Help needed to walk in hospital room?: A Lot Help needed climbing 3-5 steps with a railing? : A Lot 6 Click Score: 12    End of Session Equipment Utilized During Treatment: Gait belt;Oxygen Activity Tolerance: Other (comment)(self-limiting) Patient left: in bed;with call bell/phone within reach;with bed alarm set Nurse Communication: Mobility status PT Visit Diagnosis: Other abnormalities of gait and mobility (R26.89);Muscle weakness (generalized) (M62.81);History of falling (Z91.81)     Time: 6967-8938 PT Time Calculation (min) (ACUTE ONLY): 41 min  Charges:  $Gait Training: 8-22 mins                     Lorrin Goodell, PT  Office # [phone] Pager [phone]    Lorriane Shire 08/25/2018, 12:25 PM

## 2018-08-25 NOTE — Progress Notes (Signed)
PROGRESS NOTE    [name]  XNT:[id number] DOB: 1951-05-13 DOA: 08/12/2018 PCP: Wenda Low, MD   Brief Narrative: Patient is a 68 year old female with past medical history of hypertension, anxiety, depression, right lung cancer status post right lower lobe lobectomy and chemotherapy interventions have and was brought to the emergency department after she was found on the floor surrounded by multiple wine bottles and soiled in feces and urine.  Reported that she had fallen on 08/10/2018 but was unable to get up.  CT imagings were negative for acute intracranial abnormality or spine pathology.  Radiograph of the left shoulder revealed angulated humeral neck fracture with possible extension to the humeral head.  Underwent reverse left shoulder arthroplasty with tuberosity repair by orthopedics on 08/14/2018.  Physical therapy recommended skilled facility on discharge but she is denying.  We will consult  psychiatry today for her severe anxiety and depression .Plan is to discharge her home of she denies rehab.  Assessment & Plan:   Principal Problem:   Alcohol dependence with withdrawal (HCC) Active Problems:   COPD pfts pending/ active smoker   Closed left humeral fracture   Elevated LFTs   Hypokalemia   Macrocytic anemia   Hypotension due to hypovolemia   Closed fracture of left humerus   Malnutrition of moderate degree   H/O total shoulder replacement, left  Proximal left humeral fracture:  Underwent reverse left shoulder arthroplasty with tuberosity repair by orthopedics on 08/14/2018.  Physical therapy recommended skilled facility on discharge but she is denying. Currently pain looks well controlled.  She will follow-up with orthopedics on discharge.  Continue pain management.  Status post fall: Seen by physical therapy and recommended skilled services on discharge.  She is denying.  She might be discharged to home with home health .Denied evaluation by physical therapy today. Patient  lives alone at home.  Hypotension: Resolved  Left basilar pneumonia: Suspected aspiration.  Was in acute hypoxic respiratory failure earlier.  Respiratory status is improved.  She completed the  antibiotics course.  Sinus tachycardia: Much improved today.  IV fluids discontinued  Chronic alcohol abuse: Continue thiamine, folic acid.  Watch for withdrawal symptoms.  Depression/anxiety: On buspirone and Lexapro.  Patient looks depressed.  She does not have any energy or positive feeling. Will request for psychiatric consultation today.  She needs follow up with psychiatry as an outpatient.  Hypokalemia/hypomagnesemia: Being supplemented.  Transaminitis: Patient has history of chronic alcohol abuse.  Also was hypotensive on this hospitalization.  Hepatitis panel negative.  Macrocytic anemia: Continue folic acid.  B12 and folate normal.  She was transfused 1 unit PRBC during this hospitalization.  Currently hemoglobin stable.  COPD: Has mild expiratory wheezes.  On oxygen as needed at home.  We will continue to monitor her respiratory status and wean oxygen if possible.  We will arrange oxygen if needed.       Nutrition Problem: Moderate Malnutrition Etiology: social / environmental circumstances      DVT prophylaxis:SCD Code Status: Full Family Communication: None present at the bedside Disposition Plan:   Patient continues to deny going to the rehab.  She is acceptable for home with physical therapy.  She does not want to go home today.  We will wait for psychiatry evaluation.  Consultants: Orthopedics, PCCM  Procedures: Arthroplasty.  Antimicrobials:  Anti-infectives (From admission, onward)   Start     Dose/Rate Route Frequency Ordered Stop   08/17/18 1200  amoxicillin-clavulanate (AUGMENTIN) 875-125 MG per tablet 1 tablet  1 tablet Oral Every 12 hours 08/17/18 1058 08/21/18 2146   08/15/18 1300  Ampicillin-Sulbactam (UNASYN) 3 g in sodium chloride 0.9 % 100 mL IVPB      Note to Pharmacy:  Pharmacy may adjust dose as indicated   3 g 200 mL/hr over 30 Minutes Intravenous Every 8 hours 08/15/18 1201 08/16/18 1439   08/14/18 1145  ceFAZolin (ANCEF) IVPB 2g/100 mL premix     2 g 200 mL/hr over 30 Minutes Intravenous On call to O.R. 08/14/18 1140 08/14/18 1238   08/14/18 1143  ceFAZolin (ANCEF) 2-4 GM/100ML-% IVPB    Note to Pharmacy:  Lisette Grinder   : cabinet override      08/14/18 1143 08/14/18 1238   08/13/18 1730  Ampicillin-Sulbactam (UNASYN) 3 g in sodium chloride 0.9 % 100 mL IVPB  Status:  Discontinued    Note to Pharmacy:  Pharmacy may adjust dose as indicated   3 g 200 mL/hr over 30 Minutes Intravenous Every 8 hours 08/13/18 1729 08/15/18 1201   08/12/18 2330  ceFEPIme (MAXIPIME) 2 g in sodium chloride 0.9 % 100 mL IVPB     2 g 200 mL/hr over 30 Minutes Intravenous  Once 08/12/18 2322 08/13/18 0035   08/12/18 2330  metroNIDAZOLE (FLAGYL) IVPB 500 mg  Status:  Discontinued     500 mg 100 mL/hr over 60 Minutes Intravenous Every 8 hours 08/12/18 2322 08/13/18 0048   08/12/18 2330  vancomycin (VANCOCIN) IVPB 1000 mg/200 mL premix     1,000 mg 200 mL/hr over 60 Minutes Intravenous  Once 08/12/18 2322 08/13/18 0125      Subjective: Patient seen and examined the bedside this morning.  Looks comfortable and better today.  More coherent and engaged in conversation.Continues to deny going to rehab.  Does not want to go home today.   Objective: Vitals:   08/24/18 2000 08/25/18 0600 08/25/18 0740 08/25/18 1017  BP: 124/65  129/73 121/78  Pulse: 66  75 89  Resp:   (!) 25   Temp: 98 F (36.7 C)  (!) 97.5 F (36.4 C)   TempSrc: Oral  Axillary   SpO2: 97%  91%   Weight:  59.2 kg    Height:        Intake/Output Summary (Last 24 hours) at 08/25/2018 1131 Last data filed at 08/24/2018 1859 Gross per 24 hour  Intake 483 ml  Output 150 ml  Net 333 ml   Filed Weights   08/17/18 0500 08/18/18 0500 08/25/18 0600  Weight: 68.2 kg 64 kg 59.2 kg     Examination:  General exam: Appears calm and comfortable ,Not in distress,average built HEENT:PERRL,Oral mucosa moist, Ear/Nose normal on gross exam Respiratory system: Bilateral decreased air entry on the bases Cardiovascular system: S1 & S2 heard, RRR. No JVD, murmurs, rubs, gallops or clicks. Gastrointestinal system: Abdomen is nondistended, soft and nontender. No organomegaly or masses felt. Normal bowel sounds heard. Central nervous system: Alert and oriented. No focal neurological deficits. Extremities: No edema, no clubbing ,no cyanosis, distal peripheral pulses palpable.  Left arm on sling. Skin: Bruises/ecchymosis on her right shoulder and right back MSK: Normal muscle bulk,tone ,power Psychiatry: Judgement and insight appear normal.   Data Reviewed: I have personally reviewed following labs and imaging studies  CBC: Recent Labs  Lab 08/20/18 0300 08/22/18 0457 08/23/18 0405  WBC 4.6 5.4 8.2  HGB 10.2* 11.0* 11.7*  HCT 32.1* 34.2* 36.1  MCV 104.9* 104.3* 102.8*  PLT 240 295 742   Basic Metabolic  Panel: Recent Labs  Lab 08/20/18 0300 08/22/18 0457 08/22/18 1434 08/23/18 0405 08/24/18 0406 08/25/18 0344  NA 136 137  --  136 137 137  K 3.2* 3.6  --  3.3* 3.1* 3.1*  CL 91* 94*  --  94* 96* 102  CO2 36* 35*  --  38* 31 29  GLUCOSE 141* 93  --  95 93 81  BUN 9 6*  --  <5* <5* 7*  CREATININE 0.33* 0.33*  --  0.36* 0.34* 0.35*  CALCIUM 8.2* 8.5*  --  8.4* 8.5* 8.3*  MG 1.6* 1.4* 2.3 1.5* 1.7  --    GFR: Estimated Creatinine Clearance: 58.6 mL/min (A) (by C-G formula based on SCr of 0.35 mg/dL (L)). Liver Function Tests: Recent Labs  Lab 08/20/18 0300 08/22/18 0457 08/23/18 0405 08/24/18 0406  AST 123* 149* 206* 107*  ALT 78* 149* 214* 149*  ALKPHOS 81 86 91 84  BILITOT 0.9 0.8 0.9 0.9  PROT 4.5* 5.1* 5.3* 4.7*  ALBUMIN 2.2* 2.6* 2.7* 2.5*   No results for input(s): LIPASE, AMYLASE in the last 168 hours. No results for input(s): AMMONIA in the  last 168 hours. Coagulation Profile: No results for input(s): INR, PROTIME in the last 168 hours. Cardiac Enzymes: No results for input(s): CKTOTAL, CKMB, CKMBINDEX, TROPONINI in the last 168 hours. BNP (last 3 results) No results for input(s): PROBNP in the last 8760 hours. HbA1C: No results for input(s): HGBA1C in the last 72 hours. CBG: Recent Labs  Lab 08/22/18 0827 08/23/18 0804 08/24/18 0816 08/25/18 0749 08/25/18 0827  GLUCAP 90 88 93 69* 86   Lipid Profile: No results for input(s): CHOL, HDL, LDLCALC, TRIG, CHOLHDL, LDLDIRECT in the last 72 hours. Thyroid Function Tests: Recent Labs    08/24/18 0406  TSH 1.627   Anemia Panel: No results for input(s): VITAMINB12, FOLATE, FERRITIN, TIBC, IRON, RETICCTPCT in the last 72 hours. Sepsis Labs: No results for input(s): PROCALCITON, LATICACIDVEN in the last 168 hours.  No results found for this or any previous visit (from the past 240 hour(s)).       Radiology Studies: No results found.      Scheduled Meds: . busPIRone  10 mg Oral TID  . docusate  100 mg Oral BID  . escitalopram  10 mg Oral Daily  . feeding supplement (ENSURE ENLIVE)  237 mL Oral TID BM  . folic acid  1 mg Oral Daily  . lidocaine  1 patch Transdermal Q24H  . magnesium oxide  400 mg Oral BID  . mouth rinse  15 mL Mouth Rinse BID  . potassium chloride  40 mEq Oral Once  . sodium chloride flush  3 mL Intravenous Q12H  . thiamine  100 mg Oral Daily   Continuous Infusions:    LOS: 12 days    Time spent: 35 mins.More than 50% of that time was spent in counseling and/or coordination of care.      Shelly Coss, MD [facility] Pager [phone]  If 7PM-7AM, please contact night-coverage [email or web address] Password [facility] 08/25/2018, 11:31 AM
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
## 2018-08-25 NOTE — Progress Notes (Signed)
   Subjective: 11 Days Post-Op Procedure(s) (LRB): REVERSE SHOULDER ARTHROPLASTY (Left)  Pt resting comfortably in a chair in no acute distress Pain minimal to left anterior shoulder Denies any new symptoms Awaiting psych consult per IM note Patient reports pain as mild.  Objective:   VITALS:   Vitals:   08/25/18 0740 08/25/18 1017  BP: 129/73 121/78  Pulse: 75 89  Resp: (!) 25   Temp: (!) 97.5 F (36.4 C)   SpO2: 91%     Left shoulder incision healing appropriately nv intact distally No rashes or edema Sling in place  LABS Recent Labs    08/23/18 0405  HGB 11.7*  HCT 36.1  WBC 8.2  PLT 350    Recent Labs    08/23/18 0405 08/24/18 0406 08/25/18 0344  NA 136 137 137  K 3.3* 3.1* 3.1*  BUN <5* <5* 7*  CREATININE 0.36* 0.34* 0.35*  GLUCOSE 95 93 81     Assessment/Plan: 11 Days Post-Op Procedure(s) (LRB): REVERSE SHOULDER ARTHROPLASTY (Left) Stable from orthopedic standpoint F/u in the office in 2 weeks once discharged Gentle activity as tolerated with the left upper extremity    Brad Luna [place], [place] is now [facility]  [place] [address] [place], [place], [place] [address] Phone: [phone] [email or web address] Facebook  [employer]

## 2018-08-26 ENCOUNTER — Inpatient Hospital Stay (HOSPITAL_COMMUNITY): Payer: PPO

## 2018-08-26 LAB — BASIC METABOLIC PANEL
Anion gap: 6 (ref 5–15)
BUN: <5 mg/dL — ABNORMAL LOW (ref 8–23)
CO2: 27 mmol/L (ref 22–32)
Calcium: 8.4 mg/dL — ABNORMAL LOW (ref 8.9–10.3)
Chloride: 101 mmol/L (ref 98–111)
Creatinine, Ser: 0.31 mg/dL — ABNORMAL LOW (ref 0.44–1.00)
GFR calc Af Amer: >60 mL/min (ref >60)
GFR calc non Af Amer: >60 mL/min (ref >60)
Glucose, Bld: 82 mg/dL (ref 70–99)
Potassium: 3.6 mmol/L (ref 3.5–5.1)
SODIUM: 134 mmol/L — AB (ref 135–145)

## 2018-08-26 NOTE — Progress Notes (Signed)
Physical Therapy Treatment Patient Details Name: [name] MRN: [id number] DOB: 1950-12-20 Today's Date: 08/26/2018    History of Present Illness Pt is a 68 y.o. female with medical history significant for hypertension, anxiety, cancer of the right lung status post chemotherapy, now presenting to the emergency department after she was found down on the floor, reporting that she had fallen on 08/10/2018 and was unable to get up.  S/p L reverse total shoulder arthroplasty on 08/14/18  after finding L displaced proximal humerus fracture.  CIWA protocol.      PT Comments    Pt remains very unsteady and unsafe with all mobility. She continues to require two person physical assistance for transfers and ambulation. Additionally, she consistently WB's through her L UE despite cueing and reminders. PT continuing to recommend pt d/c to SNF; however, pt adamant about returning home.     Follow Up Recommendations  SNF     Equipment Recommendations  Cane    Recommendations for Other Services       Precautions / Restrictions Precautions Precautions: Shoulder;Fall Type of Shoulder Precautions: no shoulder ROM, active ROM elbow/wrist/hand  Shoulder Interventions: Shoulder sling/immobilizer;At all times;Off for dressing/bathing/exercises Restrictions Weight Bearing Restrictions: Yes LUE Weight Bearing: Non weight bearing Other Position/Activity Restrictions: pt requiring frequent cueing to not WB through L UE    Mobility  Bed Mobility Overal bed mobility: Needs Assistance Bed Mobility: Supine to Sit     Supine to sit: HOB elevated;Mod assist     General bed mobility comments: cues for sequencing and NWB LUE. assist to elevate trunk  Transfers Overall transfer level: Needs assistance Equipment used: 1 person hand held assist Transfers: Sit to/from Stand Sit to Stand: Mod assist;+2 safety/equipment;+2 physical assistance         General transfer comment: assist to power into  standing and for stability with transitional movement  Ambulation/Gait Ambulation/Gait assistance: +2 safety/equipment;+2 physical assistance;Mod assist;Min assist Gait Distance (Feet): 20 Feet Assistive device: 1 person hand held assist Gait Pattern/deviations: Step-through pattern;Decreased step length - right;Decreased step length - left;Decreased stride length;Drifts right/left Gait velocity: decreased   General Gait Details: pt with modest instability requiring constant min-mod A x2, only agreeable to in room ambulation; pt also with reported dizziness throughout   Stairs             Wheelchair Mobility    Modified Rankin (Stroke Patients Only)       Balance Overall balance assessment: Needs assistance Sitting-balance support: Feet supported Sitting balance-Leahy Scale: Fair     Standing balance support: Single extremity supported;During functional activity Standing balance-Leahy Scale: Poor Standing balance comment: reliant on external support                            Cognition Arousal/Alertness: Awake/alert Behavior During Therapy: Flat affect;Impulsive Overall Cognitive Status: Impaired/Different from baseline Area of Impairment: Safety/judgement;Awareness;Problem solving;Attention                   Current Attention Level: Sustained Memory: Decreased short-term memory;Decreased recall of precautions   Safety/Judgement: Decreased awareness of safety;Decreased awareness of deficits Awareness: Emergent Problem Solving: Difficulty sequencing;Requires verbal cues        Exercises      General Comments        Pertinent Vitals/Pain Pain Assessment: Faces Faces Pain Scale: Hurts even more Pain Location: LUE Pain Descriptors / Indicators: Grimacing Pain Intervention(s): Monitored during session;Repositioned    Home Living  Prior Function            PT Goals (current goals can now be found in  the care plan section) Acute Rehab PT Goals PT Goal Formulation: With patient Time For Goal Achievement: 08/30/18 Potential to Achieve Goals: Fair Progress towards PT goals: Progressing toward goals    Frequency    Min 3X/week      PT Plan Current plan remains appropriate    Co-evaluation              AM-PAC PT "6 Clicks" Mobility   Outcome Measure  Help needed turning from your back to your side while in a flat bed without using bedrails?: A Lot Help needed moving from lying on your back to sitting on the side of a flat bed without using bedrails?: A Lot Help needed moving to and from a bed to a chair (including a wheelchair)?: A Lot Help needed standing up from a chair using your arms (e.g., wheelchair or bedside chair)?: A Lot Help needed to walk in hospital room?: A Lot Help needed climbing 3-5 steps with a railing? : A Lot 6 Click Score: 12    End of Session Equipment Utilized During Treatment: Other (comment)(L UE sling) Activity Tolerance: Patient limited by pain;Treatment limited secondary to agitation Patient left: in chair;with call bell/phone within reach;with chair alarm set Nurse Communication: Mobility status PT Visit Diagnosis: Other abnormalities of gait and mobility (R26.89);Muscle weakness (generalized) (M62.81);History of falling (Z91.81)     Time: 2992-4268 PT Time Calculation (min) (ACUTE ONLY): 29 min  Charges:  $Gait Training: 8-22 mins $Therapeutic Activity: 8-22 mins                     Sherie Don, [place], DPT  Acute Rehabilitation Services Pager [phone] Office [place] 08/26/2018, 3:43 PM

## 2018-08-26 NOTE — Care Management (Addendum)
Spoke with patient regarding home O2.  Pt refuses to wear oxygen home, but states "you can have it delivered if you want to".  PT supplied documentation and order complete.  Jermaine with [facility] aware and will have oxygen delivered to home.   Expected Discharge Date:  08/26/18               Expected Discharge Plan:  [place]  In-House Referral:  Clinical Social Work  Discharge planning Services  CM Consult  Post Acute Care Choice:  Home Health Choice offered to:  Patient  DME Arranged:  Oxygen DME Agency:  [place]:  RN, PT, OT, Nurse's Aide, Social Work [employer] Agency:  [employer]  Status of Service:  Completed, signed off

## 2018-08-26 NOTE — Progress Notes (Addendum)
SATURATION QUALIFICATIONS: (This note is used to comply with regulatory documentation for home oxygen)  Patient Saturations on Room Air at Rest = 87%  Patient Saturations on Room Air while Ambulating = 90%  Patient Saturations on 0 Liters of oxygen while Ambulating = N/A  Please briefly explain why patient needs home oxygen:  Upon sitting down in chair immediately after ambulation, pt desatting to 87% on RA with slow recovery to low 90's (90-92%). Pt would benefit from home O2 based on this assessment.  Sherie Don, [place], DPT  Acute Rehabilitation Services Pager [phone] Office [phone]

## 2018-08-26 NOTE — Progress Notes (Signed)
Patient discharge instructions given, no printed prescriptions to give. IV removed. Questions answered.  Patient refusing home oxygen.  Patient taken to sister's car via wheelchair with all belongings.

## 2018-09-22 ENCOUNTER — Ambulatory Visit: Payer: Self-pay | Admitting: Emergency Medicine

## 2018-09-25 ENCOUNTER — Ambulatory Visit: Payer: PPO | Admitting: Emergency Medicine

## 2018-12-27 ENCOUNTER — Other Ambulatory Visit: Payer: Self-pay | Admitting: Internal Medicine

## 2018-12-27 DIAGNOSIS — M810 Age-related osteoporosis without current pathological fracture: Secondary | ICD-10-CM

## 2019-04-19 ENCOUNTER — Emergency Department (HOSPITAL_COMMUNITY): Payer: PPO

## 2019-04-19 ENCOUNTER — Encounter (HOSPITAL_COMMUNITY): Payer: Self-pay

## 2019-04-19 ENCOUNTER — Other Ambulatory Visit: Payer: Self-pay

## 2019-04-19 ENCOUNTER — Emergency Department (HOSPITAL_COMMUNITY)
Admission: EM | Admit: 2019-04-19 | Discharge: 2019-04-19 | Disposition: A | Discharge status: Home / Self Care | Payer: PPO | Attending: Emergency Medicine | Admitting: Emergency Medicine

## 2019-04-19 DIAGNOSIS — Y999 Unspecified external cause status: Secondary | ICD-10-CM | POA: Insufficient documentation

## 2019-04-19 DIAGNOSIS — W1830XA Fall on same level, unspecified, initial encounter: Secondary | ICD-10-CM | POA: Insufficient documentation

## 2019-04-19 DIAGNOSIS — Z85118 Personal history of other malignant neoplasm of bronchus and lung: Secondary | ICD-10-CM | POA: Diagnosis not present

## 2019-04-19 DIAGNOSIS — Z79899 Other long term (current) drug therapy: Secondary | ICD-10-CM | POA: Diagnosis not present

## 2019-04-19 DIAGNOSIS — F17210 Nicotine dependence, cigarettes, uncomplicated: Secondary | ICD-10-CM | POA: Insufficient documentation

## 2019-04-19 DIAGNOSIS — Z743 Need for continuous supervision: Secondary | ICD-10-CM | POA: Diagnosis not present

## 2019-04-19 DIAGNOSIS — S8991XA Unspecified injury of right lower leg, initial encounter: Secondary | ICD-10-CM | POA: Diagnosis not present

## 2019-04-19 DIAGNOSIS — R52 Pain, unspecified: Secondary | ICD-10-CM | POA: Diagnosis not present

## 2019-04-19 DIAGNOSIS — Y93E5 Activity, floor mopping and cleaning: Secondary | ICD-10-CM | POA: Diagnosis not present

## 2019-04-19 DIAGNOSIS — Y92031 Bathroom in apartment as the place of occurrence of the external cause: Secondary | ICD-10-CM | POA: Diagnosis not present

## 2019-04-19 DIAGNOSIS — W19XXXA Unspecified fall, initial encounter: Secondary | ICD-10-CM | POA: Diagnosis not present

## 2019-04-19 DIAGNOSIS — M25561 Pain in right knee: Secondary | ICD-10-CM | POA: Diagnosis not present

## 2019-04-19 DIAGNOSIS — R279 Unspecified lack of coordination: Secondary | ICD-10-CM | POA: Diagnosis not present

## 2019-04-19 DIAGNOSIS — I10 Essential (primary) hypertension: Secondary | ICD-10-CM | POA: Diagnosis not present

## 2019-04-19 DIAGNOSIS — R5381 Other malaise: Secondary | ICD-10-CM | POA: Diagnosis not present

## 2019-04-19 LAB — CBC WITH DIFFERENTIAL/PLATELET
Abs Immature Granulocytes: 0.05 10*3/uL (ref 0.00–0.07)
Basophils Absolute: 0.1 10*3/uL (ref 0.0–0.1)
Basophils Relative: 1 %
Eosinophils Absolute: 0.1 10*3/uL (ref 0.0–0.5)
Eosinophils Relative: 1 %
HCT: 43.4 % (ref 36.0–46.0)
Hemoglobin: 14.1 g/dL (ref 12.0–15.0)
Immature Granulocytes: 1 %
Lymphocytes Relative: 7 %
Lymphs Abs: 0.7 10*3/uL (ref 0.7–4.0)
MCH: 33.3 pg (ref 26.0–34.0)
MCHC: 32.5 g/dL (ref 30.0–36.0)
MCV: 102.6 fL — ABNORMAL HIGH (ref 80.0–100.0)
Monocytes Absolute: 1.1 10*3/uL — ABNORMAL HIGH (ref 0.1–1.0)
Monocytes Relative: 11 %
Neutro Abs: 8 10*3/uL — ABNORMAL HIGH (ref 1.7–7.7)
Neutrophils Relative %: 79 %
Platelets: 184 10*3/uL (ref 150–400)
RBC: 4.23 MIL/uL (ref 3.87–5.11)
RDW: 13 % (ref 11.5–15.5)
WBC: 9.9 10*3/uL (ref 4.0–10.5)
nRBC: 0 % (ref 0.0–0.2)

## 2019-04-19 LAB — URINALYSIS, ROUTINE W REFLEX MICROSCOPIC
Bilirubin Urine: NEGATIVE
Glucose, UA: NEGATIVE mg/dL
Hgb urine dipstick: NEGATIVE
Ketones, ur: 20 mg/dL — AB
Leukocytes,Ua: NEGATIVE
Nitrite: NEGATIVE
Protein, ur: NEGATIVE mg/dL
Specific Gravity, Urine: 1.014 (ref 1.005–1.030)
pH: 6 (ref 5.0–8.0)

## 2019-04-19 LAB — BASIC METABOLIC PANEL
Anion gap: 19 — ABNORMAL HIGH (ref 5–15)
BUN: 19 mg/dL (ref 8–23)
CO2: 23 mmol/L (ref 22–32)
Calcium: 9.3 mg/dL (ref 8.9–10.3)
Chloride: 96 mmol/L — ABNORMAL LOW (ref 98–111)
Creatinine, Ser: 0.53 mg/dL (ref 0.44–1.00)
GFR calc Af Amer: >60 mL/min (ref >60)
GFR calc non Af Amer: >60 mL/min (ref >60)
Glucose, Bld: 96 mg/dL (ref 70–99)
Potassium: 3 mmol/L — ABNORMAL LOW (ref 3.5–5.1)
Sodium: 138 mmol/L (ref 135–145)

## 2019-04-19 LAB — LACTIC ACID, PLASMA: Lactic Acid, Venous: 0.9 mmol/L (ref 0.5–1.9)

## 2019-04-19 LAB — CK: Total CK: 517 U/L — ABNORMAL HIGH (ref 38–234)

## 2019-04-19 MED ORDER — OXYCODONE HCL 5 MG PO TABS
5.0000 mg | ORAL_TABLET | Freq: Once | ORAL | Status: AC
  Administered 2019-04-19: 5 mg via ORAL
  Filled 2019-04-19: qty 1

## 2019-04-19 MED ORDER — SODIUM CHLORIDE 0.9 % IV BOLUS
1000.0000 mL | Freq: Once | INTRAVENOUS | Status: AC
  Administered 2019-04-19: 1000 mL via INTRAVENOUS

## 2019-04-19 MED ORDER — POTASSIUM CHLORIDE CRYS ER 20 MEQ PO TBCR
40.0000 meq | EXTENDED_RELEASE_TABLET | Freq: Once | ORAL | Status: AC
  Administered 2019-04-19: 40 meq via ORAL
  Filled 2019-04-19: qty 2

## 2019-04-19 NOTE — TOC Initial Note (Addendum)
Transition of Care [facility]) - Initial/Assessment Note    Patient Details  Name: [name] MRN: [id number] Date of Birth: 1951-04-05  Transition of Care [facility]) CM/SW Contact:    Erenest Rasher, RN Phone Number: [phone] 04/19/2019, 5:51 PM  Clinical Narrative:                 Spoke to pt and states she lives at home alone. She does not have family support. [place] for 3n1 bedside commode and RW for home. Delivered to room. TOC CM will take to her home if [facility] are unable to transport. Offered choice for [facility], pt is agreeable to [facility]. Contacted rep with new referral. Waiting for confirmation.   Received call from [place], Dorian [facility], they accepted referral.   Expected Discharge Plan: [place] Barriers to Discharge: Continued Medical Work up   Patient Goals and CMS Choice Patient states their goals for this hospitalization and ongoing recovery are:: lives alone CMS Medicare.gov Compare Post Acute Care list provided to:: Patient Choice offered to / list presented to : Patient  Expected Discharge Plan and Services Expected Discharge Plan: [place] In-house Referral: Clinical Social Work Discharge Planning Services: CM Consult Post Acute Care Choice: [place] arrangements for the past 2 months: Apartment                 DME Arranged: 3-N-1, Walker rolling DME Agency: [employer] Date DME Agency Contacted: 04/19/19 Time DME Agency Contacted: [phone] Representative spoke with at DME Agency: Dawayne Patricia HH Arranged: PT, OT, Nurse's Aide, Social Work   Date [place]: 04/19/19 Time [place]: 1741 Representative spoke with at [place]: Glyn Ade  Prior Living Arrangements/Services Living arrangements for the past 2 months: Apartment Lives with:: Self Patient language and need for interpreter reviewed:: Yes Do you feel safe going back to the place where you live?: Yes      Need for  Family Participation in Patient Care: Yes (Comment) Care giver support system in place?: No (comment)   Criminal Activity/Legal Involvement Pertinent to Current Situation/Hospitalization: No - Comment as needed  Activities of Daily Living      Permission Sought/Granted Permission sought to share information with : Case Manager, PCP Permission granted to share information with : Yes, Verbal Permission Granted              Emotional Assessment Appearance:: Appears stated age Attitude/Demeanor/Rapport: Gracious Affect (typically observed): Tearful/Crying Orientation: : Oriented to Self, Oriented to Place, Oriented to  Time, Oriented to Situation   Psych Involvement: No (comment)  Admission diagnosis:  fall knee pain Patient Active Problem List   Diagnosis Date Noted  . Malnutrition of moderate degree 08/14/2018  . 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 Alcohol dependence with withdrawal ([place]) 08/13/2018  . Elevated LFTs 08/13/2018  . Hypokalemia 08/13/2018  . Macrocytic anemia 08/13/2018  . Hypotension due to hypovolemia 08/13/2018  . Closed fracture of left humerus 08/13/2018  . Tachycardia   . Closed left humeral fracture 08/12/2018  . Solitary pulmonary nodule on lung CT 06/24/2018  . COPD pfts pending/ active smoker 06/24/2018  . Cigarette smoker 06/24/2018  . Tongue ulceration 06/14/2018  . Personal history of colonic polyps 01/29/2013  . Internal prolapsed hemorrhoids 01/29/2013   PCP:  Wenda Low, MD Pharmacy:   [facility] [place], [place] AT [place]  [address] [place] [address] Phone: [phone] Fax: [phone]     Social Determinants of Health (SDOH) Interventions    Readmission Risk Interventions No flowsheet data found.

## 2019-04-19 NOTE — ED Triage Notes (Signed)
EMS reports from home, fall in bathroom Monday while cleaning, Pt c/o increasing right knee pain since and states she has been unable to ambulate since Monday.  BP 136/76 HR 108 RR 20 Sp02 98 RA CBG 101 Temp 97.5

## 2019-04-19 NOTE — Progress Notes (Signed)
CSW received a call from pt's RN stating transportation needs for the pt will need to be facilitated as pt's plan is to transport by [facility], however, PT[facility] will not transport pt's just-acquired (from RN CM) portable commode and walker.  CSW asked pt if she had friends or family to meet her there to assist her in returning home and pt stated her friends/neighbors moved two weeks ago and her family was dead.  Pt denied having a daughter despite a daughter being listed as an emergency contact on her facesheet.  CSW stated it's possible [facility] may not allow pt to remain there alne after dropping her off if they feel her to be unsafe and pt assured the CSW that she would be safe as she has been without equipment for that long and with equipment she feels she would be successful in her home.  Pt was adamant she wanted to return and after being asked X 2 if she wanted to consider SNF rehab pt X 2 was adamant she wasn't, "gonna do anything like that, I'm going home" and requested that the CSW, "make it quick".  Pt then state  Pt denied X 2 the ability to pay for a taxi or [place] but was agreeable to a taxi taking her equipment home ahead of her and meeting her at the door.  CSW called [facility] who arrived on-scene and was agreeable to this.  CSW called the taxi after calling [facility] dispatch and being told that PT[facility] should arrive within 20-25 minutes.  CSW then provided instructions to the taxi driver, placed the equipment in the taxi along with the EMT who assisted and taxi driver voiced understanding that [facility] should be arriving almost directly behind them.  EMT updated and will advise RN.  CSW will continue to follow for D/C needs.  Alphonse Guild. Jassica Zazueta, LCSW, LCAS, CSI Transitions of Care Clinical Social Worker Care Coordination Department Ph: [phone]

## 2019-04-19 NOTE — Progress Notes (Addendum)
Consult request has been received.  CSW spoke with RN CM who had met pt's needs as pt is requesting to return home at this time, per EPD's note.  Please reconsult if future social work needs arise.  CSW signing off, as social work intervention is no longer needed.  Alphonse Guild. Shuntavia Yerby, LCSW, LCAS, CSI Transitions of Care Clinical Social Worker Care Coordination Department Ph: [phone]

## 2019-04-19 NOTE — ED Provider Notes (Signed)
  Provider Note MRN:  [id number]  Arrival date & time: 04/19/19    ED Course and Medical Decision Making  Assumed care from Dr. Billy Fischer at shift change.  Continued knee pain, difficulty walking, negative x-ray, will follow up with CT scan.  7:30 PM update: CT scan is without acute fracture.  Social work and case management were consulted, patient has been provided with home assistance and she is requesting discharge home.  Final Clinical Impressions(s) / ED Diagnoses     ICD-10-CM   1. Acute pain of right knee  M25.561   2. Fall, initial encounter  W19.[facility]     ED Discharge Orders    None        Discharge Instructions     Follow-up with your doctor.  Return here as needed.  Increase your fluid intake.  Tylenol and Motrin for any pain.    Barth Kirks. Sedonia Small, [place] [email or web address]    Maudie Flakes, MD 04/19/19 [phone]

## 2019-04-19 NOTE — Discharge Instructions (Addendum)
Follow-up with your doctor.  Return here as needed.  Increase your fluid intake.  Tylenol and Motrin for any pain.

## 2019-04-19 NOTE — ED Provider Notes (Signed)
[facility] DEPT Provider Note   CSN: [id number] Arrival date & time: 04/19/19  0909     History   Chief Complaint Chief Complaint  Patient presents with  . Fall  . Knee Pain    HPI [name] is a 68 y.o. female.     HPI Patient presents to the emergency department with a right knee injury from a fall that occurred while she was cleaning her bathroom.  The patient states she slipped on the bathroom floor landing on her knee.  Patient states that she has had trouble ambulating since Monday.  She states that she is not having any other pain or injuries.  The patient states she laid in bed because she did not feel like she could walk very well.  The patient denies chest pain, shortness of breath, headache,blurred vision, neck pain, fever, cough, weakness, numbness, dizziness, anorexia, edema, abdominal pain, nausea, vomiting, diarrhea, rash, back pain, dysuria, hematemesis, bloody stool, near syncope, or syncope. Past Medical History:  Diagnosis Date  . Anemia   . Cancer [facility]) 2007   lung cancer, right, chemo done  . Complication of anesthesia 2007   slow to awaken  . History of blood transfusion 2010  . Hypertension   . Occasional tremors     Patient Active Problem List   Diagnosis Date Noted  . Malnutrition of moderate degree 08/14/2018  . 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 Alcohol dependence with withdrawal ([facility]) 08/13/2018  . Elevated LFTs 08/13/2018  . Hypokalemia 08/13/2018  . Macrocytic anemia 08/13/2018  . Hypotension due to hypovolemia 08/13/2018  . Closed fracture of left humerus 08/13/2018  . Tachycardia   . Closed left humeral fracture 08/12/2018  . Solitary pulmonary nodule on lung CT 06/24/2018  . COPD pfts pending/ active smoker 06/24/2018  . Cigarette smoker 06/24/2018  . Tongue ulceration 06/14/2018  . Personal history of colonic polyps 01/29/2013  . Internal prolapsed hemorrhoids 01/29/2013    Past  Surgical History:  Procedure Laterality Date  . COLONOSCOPY WITH PROPOFOL N/A 11/28/2012   Procedure: COLONOSCOPY WITH PROPOFOL;  Surgeon: Garlan Fair, MD;  Location: [facility];  Service: Endoscopy;  Laterality: N/A;  . REVERSE SHOULDER ARTHROPLASTY Left 08/14/2018   Procedure: REVERSE SHOULDER ARTHROPLASTY;  Surgeon: Netta Cedars, MD;  Location: [place];  Service: Orthopedics;  Laterality: Left;  . right lung lobectomy  2007     OB History   No obstetric history on file.      Home Medications    Prior to Admission medications   Medication Sig Start Date End Date Taking? Authorizing Provider  atorvastatin (LIPITOR) 10 MG tablet Take 10 mg by mouth daily.    [provider]  busPIRone (BUSPAR) 10 MG tablet Take 1 tablet (10 mg total) by mouth 3 (three) times daily. 08/25/18   Shelly Coss, MD  escitalopram (LEXAPRO) 10 MG tablet Take 1 tablet (10 mg total) by mouth daily. 08/25/18   Shelly Coss, MD  folic acid (FOLVITE) 1 MG tablet Take 1 tablet (1 mg total) by mouth daily. 08/26/18   Shelly Coss, MD  hydrocortisone-pramoxine [facility]) 2.5-1 % rectal cream PLACE RECTALLY 4 (FOUR) TIMES DAILY. FOR IRRITATED AND PAINFUL HEMORRHOIDS Patient taking differently: Place 1 application rectally 4 (four) times daily as needed (for irritated and painful hemorrhoids).     Michael Boston, MD  magnesium oxide (MAG-OX) 400 (241.3 Mg) MG tablet Take 1 tablet (400 mg total) by mouth 2 (two) times daily. 08/25/18  Shelly Coss, MD  Multiple Vitamin (MULTIVITAMIN) tablet Take 1 tablet by mouth daily.    [provider]  potassium chloride 20 MEQ/15ML (10%) SOLN Take 15 mLs (20 mEq total) by mouth daily for 14 doses. 08/26/18 09/09/18  Shelly Coss, MD  PROAIR HFA 108 (90 Base) MCG/ACT inhaler Inhale 2 puffs into the lungs every 4 (four) hours as needed. 04/20/18   [provider]  thiamine 100 MG tablet Take 1 tablet (100 mg total) by mouth daily. 08/26/18    Shelly Coss, MD  traMADol (ULTRAM) 50 MG tablet Take 1 tablet (50 mg total) by mouth every 6 (six) hours as needed for moderate pain. 08/25/18   Shelly Coss, MD    Family History Family History  Problem Relation Age of Onset  . Cancer Mother        bladder  . Heart disease Father   . Diabetes Father     Social History Social History   Tobacco Use  . Smoking status: Current Some Day Smoker    Packs/day: 3.00    Years: 25.00    Pack years: 75.00    Types: Cigarettes  . Smokeless tobacco: Never Used  Substance Use Topics  . Alcohol use: Yes    Alcohol/week: 7.0 standard drinks    Types: 7 Glasses of wine per week    Comment: 1 glass winde daily  . Drug use: No     Allergies   Tape   Review of Systems Review of Systems  All other systems negative except as documented in the HPI. All pertinent positives and negatives as reviewed in the HPI. Physical Exam Updated Vital Signs BP 127/80   Pulse 96   Temp 98.3 F (36.8 C) (Oral)   Resp 16   SpO2 94%   Physical Exam Vitals signs and nursing note reviewed.  Constitutional:      General: She is not in acute distress.    Appearance: She is well-developed.  HENT:     Head: Normocephalic and atraumatic.  Eyes:     Pupils: Pupils are equal, round, and reactive to light.  Neck:     Musculoskeletal: Normal range of motion and neck supple.  Cardiovascular:     Rate and Rhythm: Normal rate and regular rhythm.     Heart sounds: Normal heart sounds. No murmur. No friction rub. No gallop.   Pulmonary:     Effort: Pulmonary effort is normal. No respiratory distress.     Breath sounds: Normal breath sounds. No wheezing.  Abdominal:     General: Bowel sounds are normal. There is no distension.     Palpations: Abdomen is soft.     Tenderness: There is no abdominal tenderness.  Musculoskeletal:     Right knee: She exhibits no swelling, no effusion, no ecchymosis and no deformity. Tenderness found.  Skin:     General: Skin is warm and dry.     Capillary Refill: Capillary refill takes less than 2 seconds.     Findings: No erythema or rash.  Neurological:     Mental Status: She is alert and oriented to person, place, and time.     Motor: No abnormal muscle tone.     Coordination: Coordination normal.  Psychiatric:        Behavior: Behavior normal.      ED Treatments / Results  Labs (all labs ordered are listed, but only abnormal results are displayed) Labs Reviewed  BASIC METABOLIC PANEL - Abnormal; Notable for the following  components:      Result Value   Potassium 3.0 (*)    Chloride 96 (*)    Anion gap 19 (*)    All other components within normal limits  CBC WITH DIFFERENTIAL/PLATELET - Abnormal; Notable for the following components:   MCV 102.6 (*)    Neutro Abs 8.0 (*)    Monocytes Absolute 1.1 (*)    All other components within normal limits  URINALYSIS, ROUTINE W REFLEX MICROSCOPIC - Abnormal; Notable for the following components:   Ketones, ur 20 (*)    All other components within normal limits  CK - Abnormal; Notable for the following components:   Total CK 517 (*)    All other components within normal limits  LACTIC ACID, PLASMA  LACTIC ACID, PLASMA    EKG None  Radiology Dg Knee Complete 4 Views Right  Result Date: 04/19/2019 CLINICAL DATA:  Right knee pain after fall. EXAM: RIGHT KNEE - COMPLETE 4+ VIEW COMPARISON:  None. FINDINGS: No evidence of fracture, dislocation, or joint effusion. No evidence of arthropathy or other focal bone abnormality. Soft tissues are unremarkable. IMPRESSION: Negative. Electronically Signed   By: Marijo Conception M.D.   On: 04/19/2019 12:02    Procedures Procedures (including critical care time)  Medications Ordered in ED Medications  sodium chloride 0.9 % bolus 1,000 mL (0 mLs Intravenous Stopped 04/19/19 1403)     Initial Impression / Assessment and Plan / ED Course  I have reviewed the triage vital signs and the nursing  notes.  Pertinent labs & imaging results that were available during my care of the patient were reviewed by me and considered in my medical decision making (see chart for details).       Patient has been stable here in the emergency department.  She is received IV fluids.  She probably is dehydrated based on the fact she is not having thing to eat or drink over the last 2 days.  Patient most likely will need recheck by her doctor.  The patient's vital signs remained stable here in the emergency department.  She does appear disheveled and most likely need help from her daughter potentially.  Final Clinical Impressions(s) / ED Diagnoses   Final diagnoses:  None    ED Discharge Orders    None       Dalia Heading, PA-C 04/19/19 1519    Gareth Morgan, MD 04/19/19 [phone]

## 2019-04-20 DIAGNOSIS — M25561 Pain in right knee: Secondary | ICD-10-CM | POA: Diagnosis not present

## 2019-04-22 DIAGNOSIS — D649 Anemia, unspecified: Secondary | ICD-10-CM | POA: Diagnosis not present

## 2019-04-22 DIAGNOSIS — Z9181 History of falling: Secondary | ICD-10-CM | POA: Diagnosis not present

## 2019-04-22 DIAGNOSIS — Z85118 Personal history of other malignant neoplasm of bronchus and lung: Secondary | ICD-10-CM | POA: Diagnosis not present

## 2019-04-22 DIAGNOSIS — F17210 Nicotine dependence, cigarettes, uncomplicated: Secondary | ICD-10-CM | POA: Diagnosis not present

## 2019-04-22 DIAGNOSIS — G8911 Acute pain due to trauma: Secondary | ICD-10-CM | POA: Diagnosis not present

## 2019-04-22 DIAGNOSIS — Z96612 Presence of left artificial shoulder joint: Secondary | ICD-10-CM | POA: Diagnosis not present

## 2019-04-22 DIAGNOSIS — Z86010 Personal history of colonic polyps: Secondary | ICD-10-CM | POA: Diagnosis not present

## 2019-04-22 DIAGNOSIS — E440 Moderate protein-calorie malnutrition: Secondary | ICD-10-CM | POA: Diagnosis not present

## 2019-04-22 DIAGNOSIS — M25561 Pain in right knee: Secondary | ICD-10-CM | POA: Diagnosis not present

## 2019-04-22 DIAGNOSIS — I10 Essential (primary) hypertension: Secondary | ICD-10-CM | POA: Diagnosis not present

## 2019-04-22 DIAGNOSIS — J449 Chronic obstructive pulmonary disease, unspecified: Secondary | ICD-10-CM | POA: Diagnosis not present

## 2019-04-24 ENCOUNTER — Other Ambulatory Visit: Payer: Self-pay

## 2019-04-24 NOTE — Patient Outreach (Signed)
[facility]) Care Management  04/24/2019  RONNIKA COLLETT [birth year] [id number]   High priority referral forwarded from Welch, Raynaldo Opitz.  "referral from Parsons, Orlin Hilding - recent ER visit for a fall in her home, right knee is bandaged. Elevating knee, icing knee, and taking aspirin (that's all she has done at home). Was not prescribed any pain meds by ER physician. She has not called her PCP because he will want to see her in clinic - has no transportation. She only takes buspirone but has not had in the last week because she cannot get to the pharmacy. Member did not want me to call her PCP or her pharmacy. Per ER, referral for [facility] and PT send, lives alone, no family or friends to assist" Contacted patient today.  Introduced self and reason for referral.  Asked patient if she was seeking transportation to MD appointment for knee injury.  Patient stated that she did not need a ride to the MD.  She stated, "I need a ride to the grocery store.  No, not the grocery store to [facility] for medication and supplies."  Inquired about which [facility] patient utilizes in order to further assist with request.  Patient stated that she was unsure of what street it is on.  Attempted to gather more information to determine location at which time patient stated "it doesn't matter, I will call someone else" and abruptly ended call.   Ronn Melena, BSW Social Worker [phone]

## 2019-04-30 DIAGNOSIS — J449 Chronic obstructive pulmonary disease, unspecified: Secondary | ICD-10-CM | POA: Diagnosis not present

## 2019-04-30 DIAGNOSIS — G8911 Acute pain due to trauma: Secondary | ICD-10-CM | POA: Diagnosis not present

## 2019-04-30 DIAGNOSIS — Z9181 History of falling: Secondary | ICD-10-CM | POA: Diagnosis not present

## 2019-04-30 DIAGNOSIS — D649 Anemia, unspecified: Secondary | ICD-10-CM | POA: Diagnosis not present

## 2019-04-30 DIAGNOSIS — M25561 Pain in right knee: Secondary | ICD-10-CM | POA: Diagnosis not present

## 2019-04-30 DIAGNOSIS — Z96612 Presence of left artificial shoulder joint: Secondary | ICD-10-CM | POA: Diagnosis not present

## 2019-04-30 DIAGNOSIS — Z85118 Personal history of other malignant neoplasm of bronchus and lung: Secondary | ICD-10-CM | POA: Diagnosis not present

## 2019-04-30 DIAGNOSIS — Z86010 Personal history of colonic polyps: Secondary | ICD-10-CM | POA: Diagnosis not present

## 2019-04-30 DIAGNOSIS — I10 Essential (primary) hypertension: Secondary | ICD-10-CM | POA: Diagnosis not present

## 2019-04-30 DIAGNOSIS — E440 Moderate protein-calorie malnutrition: Secondary | ICD-10-CM | POA: Diagnosis not present

## 2019-04-30 DIAGNOSIS — F17210 Nicotine dependence, cigarettes, uncomplicated: Secondary | ICD-10-CM | POA: Diagnosis not present

## 2019-06-13 ENCOUNTER — Other Ambulatory Visit: Payer: Self-pay | Admitting: Internal Medicine

## 2019-06-13 DIAGNOSIS — Z1231 Encounter for screening mammogram for malignant neoplasm of breast: Secondary | ICD-10-CM

## 2019-06-23 ENCOUNTER — Ambulatory Visit: Payer: PPO

## 2020-01-04 ENCOUNTER — Inpatient Hospital Stay (HOSPITAL_COMMUNITY)
Admission: AD | Admit: 2020-01-04 | Discharge: 2020-01-14 | DRG: 432 | Disposition: A | Discharge status: Skilled Nursing Facility | Payer: Medicare Other | Attending: Internal Medicine | Admitting: Internal Medicine

## 2020-01-04 ENCOUNTER — Emergency Department (HOSPITAL_COMMUNITY): Payer: Medicare Other

## 2020-01-04 ENCOUNTER — Other Ambulatory Visit: Payer: Self-pay

## 2020-01-04 ENCOUNTER — Encounter (HOSPITAL_COMMUNITY): Payer: Self-pay | Admitting: Emergency Medicine

## 2020-01-04 DIAGNOSIS — D539 Nutritional anemia, unspecified: Secondary | ICD-10-CM | POA: Diagnosis present

## 2020-01-04 DIAGNOSIS — J90 Pleural effusion, not elsewhere classified: Secondary | ICD-10-CM | POA: Diagnosis not present

## 2020-01-04 DIAGNOSIS — K7011 Alcoholic hepatitis with ascites: Secondary | ICD-10-CM | POA: Diagnosis present

## 2020-01-04 DIAGNOSIS — F39 Unspecified mood [affective] disorder: Secondary | ICD-10-CM | POA: Diagnosis present

## 2020-01-04 DIAGNOSIS — L8990 Pressure ulcer of unspecified site, unspecified stage: Secondary | ICD-10-CM | POA: Insufficient documentation

## 2020-01-04 DIAGNOSIS — E876 Hypokalemia: Secondary | ICD-10-CM | POA: Diagnosis present

## 2020-01-04 DIAGNOSIS — R1084 Generalized abdominal pain: Secondary | ICD-10-CM | POA: Diagnosis not present

## 2020-01-04 DIAGNOSIS — G250 Essential tremor: Secondary | ICD-10-CM | POA: Diagnosis present

## 2020-01-04 DIAGNOSIS — Z9221 Personal history of antineoplastic chemotherapy: Secondary | ICD-10-CM | POA: Diagnosis not present

## 2020-01-04 DIAGNOSIS — R791 Abnormal coagulation profile: Secondary | ICD-10-CM | POA: Diagnosis present

## 2020-01-04 DIAGNOSIS — Z85118 Personal history of other malignant neoplasm of bronchus and lung: Secondary | ICD-10-CM

## 2020-01-04 DIAGNOSIS — Z9109 Other allergy status, other than to drugs and biological substances: Secondary | ICD-10-CM

## 2020-01-04 DIAGNOSIS — G9341 Metabolic encephalopathy: Secondary | ICD-10-CM

## 2020-01-04 DIAGNOSIS — F1020 Alcohol dependence, uncomplicated: Secondary | ICD-10-CM

## 2020-01-04 DIAGNOSIS — Z20822 Contact with and (suspected) exposure to covid-19: Secondary | ICD-10-CM | POA: Diagnosis present

## 2020-01-04 DIAGNOSIS — K7031 Alcoholic cirrhosis of liver with ascites: Secondary | ICD-10-CM | POA: Diagnosis present

## 2020-01-04 DIAGNOSIS — R140 Abdominal distension (gaseous): Secondary | ICD-10-CM | POA: Diagnosis not present

## 2020-01-04 DIAGNOSIS — F10239 Alcohol dependence with withdrawal, unspecified: Secondary | ICD-10-CM | POA: Diagnosis present

## 2020-01-04 DIAGNOSIS — R911 Solitary pulmonary nodule: Secondary | ICD-10-CM | POA: Diagnosis present

## 2020-01-04 DIAGNOSIS — F17200 Nicotine dependence, unspecified, uncomplicated: Secondary | ICD-10-CM | POA: Diagnosis not present

## 2020-01-04 DIAGNOSIS — Z66 Do not resuscitate: Secondary | ICD-10-CM | POA: Diagnosis present

## 2020-01-04 DIAGNOSIS — F17210 Nicotine dependence, cigarettes, uncomplicated: Secondary | ICD-10-CM | POA: Diagnosis present

## 2020-01-04 DIAGNOSIS — E861 Hypovolemia: Secondary | ICD-10-CM | POA: Diagnosis present

## 2020-01-04 DIAGNOSIS — I10 Essential (primary) hypertension: Secondary | ICD-10-CM | POA: Diagnosis present

## 2020-01-04 DIAGNOSIS — Z8249 Family history of ischemic heart disease and other diseases of the circulatory system: Secondary | ICD-10-CM | POA: Diagnosis not present

## 2020-01-04 DIAGNOSIS — J441 Chronic obstructive pulmonary disease with (acute) exacerbation: Secondary | ICD-10-CM | POA: Diagnosis present

## 2020-01-04 DIAGNOSIS — I959 Hypotension, unspecified: Secondary | ICD-10-CM | POA: Diagnosis present

## 2020-01-04 DIAGNOSIS — I9589 Other hypotension: Secondary | ICD-10-CM | POA: Diagnosis present

## 2020-01-04 DIAGNOSIS — K766 Portal hypertension: Secondary | ICD-10-CM | POA: Diagnosis present

## 2020-01-04 DIAGNOSIS — F10231 Alcohol dependence with withdrawal delirium: Secondary | ICD-10-CM | POA: Diagnosis not present

## 2020-01-04 DIAGNOSIS — E8809 Other disorders of plasma-protein metabolism, not elsewhere classified: Secondary | ICD-10-CM | POA: Diagnosis present

## 2020-01-04 DIAGNOSIS — L89152 Pressure ulcer of sacral region, stage 2: Secondary | ICD-10-CM | POA: Diagnosis present

## 2020-01-04 DIAGNOSIS — R5383 Other fatigue: Secondary | ICD-10-CM

## 2020-01-04 DIAGNOSIS — Z96612 Presence of left artificial shoulder joint: Secondary | ICD-10-CM | POA: Diagnosis present

## 2020-01-04 DIAGNOSIS — R109 Unspecified abdominal pain: Secondary | ICD-10-CM

## 2020-01-04 DIAGNOSIS — R112 Nausea with vomiting, unspecified: Secondary | ICD-10-CM | POA: Diagnosis present

## 2020-01-04 DIAGNOSIS — Z9114 Patient's other noncompliance with medication regimen: Secondary | ICD-10-CM

## 2020-01-04 DIAGNOSIS — Z79899 Other long term (current) drug therapy: Secondary | ICD-10-CM

## 2020-01-04 DIAGNOSIS — R7989 Other specified abnormal findings of blood chemistry: Secondary | ICD-10-CM | POA: Diagnosis present

## 2020-01-04 DIAGNOSIS — D72829 Elevated white blood cell count, unspecified: Secondary | ICD-10-CM | POA: Diagnosis present

## 2020-01-04 DIAGNOSIS — I739 Peripheral vascular disease, unspecified: Secondary | ICD-10-CM | POA: Diagnosis present

## 2020-01-04 DIAGNOSIS — Z9119 Patient's noncompliance with other medical treatment and regimen: Secondary | ICD-10-CM

## 2020-01-04 LAB — LACTIC ACID, PLASMA
Lactic Acid, Venous: 2.3 mmol/L (ref 0.5–1.9)
Lactic Acid, Venous: 1.1 mmol/L (ref 0.5–1.9)

## 2020-01-04 LAB — HIV ANTIBODY (ROUTINE TESTING W REFLEX): HIV Screen 4th Generation wRfx: NONREACTIVE

## 2020-01-04 LAB — BASIC METABOLIC PANEL
Anion gap: 12 (ref 5–15)
BUN: 26 mg/dL — ABNORMAL HIGH (ref 8–23)
CO2: 28 mmol/L (ref 22–32)
Calcium: 8.4 mg/dL — ABNORMAL LOW (ref 8.9–10.3)
Chloride: 96 mmol/L — ABNORMAL LOW (ref 98–111)
Creatinine, Ser: 1 mg/dL (ref 0.44–1.00)
GFR calc Af Amer: >60 mL/min (ref >60)
GFR calc non Af Amer: 57 mL/min — ABNORMAL LOW (ref >60)
Glucose, Bld: 143 mg/dL — ABNORMAL HIGH (ref 70–99)
Potassium: 3.3 mmol/L — ABNORMAL LOW (ref 3.5–5.1)
Sodium: 136 mmol/L (ref 135–145)

## 2020-01-04 LAB — HEPATIC FUNCTION PANEL
ALT: 50 U/L — ABNORMAL HIGH (ref 0–44)
AST: 135 U/L — ABNORMAL HIGH (ref 15–41)
Albumin: 2.3 g/dL — ABNORMAL LOW (ref 3.5–5.0)
Alkaline Phosphatase: 152 U/L — ABNORMAL HIGH (ref 38–126)
Bilirubin, Direct: 1.8 mg/dL — ABNORMAL HIGH (ref 0.0–0.2)
Indirect Bilirubin: 2.1 mg/dL — ABNORMAL HIGH (ref 0.3–0.9)
Total Bilirubin: 3.9 mg/dL — ABNORMAL HIGH (ref 0.3–1.2)
Total Protein: 5.7 g/dL — ABNORMAL LOW (ref 6.5–8.1)

## 2020-01-04 LAB — ACETAMINOPHEN LEVEL: Acetaminophen (Tylenol), Serum: <10 ug/mL — ABNORMAL LOW (ref 10–30)

## 2020-01-04 LAB — MAGNESIUM: Magnesium: 1.8 mg/dL (ref 1.7–2.4)

## 2020-01-04 LAB — CBC
HCT: 31 % — ABNORMAL LOW (ref 36.0–46.0)
Hemoglobin: 10.1 g/dL — ABNORMAL LOW (ref 12.0–15.0)
MCH: 36.1 pg — ABNORMAL HIGH (ref 26.0–34.0)
MCHC: 32.6 g/dL (ref 30.0–36.0)
MCV: 110.7 fL — ABNORMAL HIGH (ref 80.0–100.0)
Platelets: 200 10*3/uL (ref 150–400)
RBC: 2.8 MIL/uL — ABNORMAL LOW (ref 3.87–5.11)
RDW: 12.8 % (ref 11.5–15.5)
WBC: 16.6 10*3/uL — ABNORMAL HIGH (ref 4.0–10.5)
nRBC: 0 % (ref 0.0–0.2)

## 2020-01-04 LAB — PHOSPHORUS: Phosphorus: 3.8 mg/dL (ref 2.5–4.6)

## 2020-01-04 LAB — CBG MONITORING, ED: Glucose-Capillary: 126 mg/dL — ABNORMAL HIGH (ref 70–99)

## 2020-01-04 LAB — TROPONIN I (HIGH SENSITIVITY)
Troponin I (High Sensitivity): 8 ng/L (ref <18)
Troponin I (High Sensitivity): 7 ng/L (ref <18)

## 2020-01-04 LAB — ETHANOL: Alcohol, Ethyl (B): <10 mg/dL (ref <10)

## 2020-01-04 LAB — PROTIME-INR
INR: 1.3 — ABNORMAL HIGH (ref 0.8–1.2)
Prothrombin Time: 15.6 seconds — ABNORMAL HIGH (ref 11.4–15.2)

## 2020-01-04 LAB — BRAIN NATRIURETIC PEPTIDE: B Natriuretic Peptide: 116.8 pg/mL — ABNORMAL HIGH (ref 0.0–100.0)

## 2020-01-04 LAB — LIPASE, BLOOD: Lipase: 52 U/L — ABNORMAL HIGH (ref 11–51)

## 2020-01-04 LAB — SARS CORONAVIRUS 2 BY RT PCR (HOSPITAL ORDER, PERFORMED IN ~~LOC~~ HOSPITAL LAB): SARS Coronavirus 2: NEGATIVE

## 2020-01-04 LAB — AMMONIA: Ammonia: 47 umol/L — ABNORMAL HIGH (ref 9–35)

## 2020-01-04 MED ORDER — SODIUM CHLORIDE 0.9 % IV BOLUS
1000.0000 mL | Freq: Once | INTRAVENOUS | Status: AC
  Administered 2020-01-04: 1000 mL via INTRAVENOUS

## 2020-01-04 MED ORDER — IPRATROPIUM-ALBUTEROL 0.5-2.5 (3) MG/3ML IN SOLN
3.0000 mL | Freq: Four times a day (QID) | RESPIRATORY_TRACT | Status: DC
  Administered 2020-01-05 – 2020-01-06 (×8): 3 mL via RESPIRATORY_TRACT
  Filled 2020-01-04 (×8): qty 3

## 2020-01-04 MED ORDER — ACETAMINOPHEN 325 MG PO TABS: 650.0000 mg | ORAL_TABLET | Freq: Four times a day (QID) | ORAL | Status: DC | PRN

## 2020-01-04 MED ORDER — ADULT MULTIVITAMIN W/MINERALS CH
1.0000 | ORAL_TABLET | Freq: Every day | ORAL | Status: DC
  Administered 2020-01-05 – 2020-01-14 (×4): 1 via ORAL
  Filled 2020-01-04 (×8): qty 1

## 2020-01-04 MED ORDER — METHYLPREDNISOLONE SODIUM SUCC 125 MG IJ SOLR
60.0000 mg | Freq: Three times a day (TID) | INTRAMUSCULAR | Status: DC
  Administered 2020-01-04 – 2020-01-06 (×6): 60 mg via INTRAVENOUS
  Filled 2020-01-04 (×6): qty 2

## 2020-01-04 MED ORDER — ONDANSETRON HCL 4 MG/2ML IJ SOLN
4.0000 mg | Freq: Four times a day (QID) | INTRAMUSCULAR | Status: DC | PRN
  Administered 2020-01-04 – 2020-01-14 (×4): 4 mg via INTRAVENOUS
  Filled 2020-01-04 (×4): qty 2

## 2020-01-04 MED ORDER — SODIUM CHLORIDE 0.9 % IV BOLUS
250.0000 mL | Freq: Once | INTRAVENOUS | Status: AC
  Administered 2020-01-04: 250 mL via INTRAVENOUS

## 2020-01-04 MED ORDER — TRAMADOL HCL 50 MG PO TABS: 50.0000 mg | ORAL_TABLET | Freq: Four times a day (QID) | ORAL | Status: DC | PRN

## 2020-01-04 MED ORDER — FOLIC ACID 1 MG PO TABS
1.0000 mg | ORAL_TABLET | Freq: Every day | ORAL | Status: DC
  Administered 2020-01-05 – 2020-01-14 (×5): 1 mg via ORAL
  Filled 2020-01-04 (×7): qty 1

## 2020-01-04 MED ORDER — THIAMINE HCL 100 MG/ML IJ SOLN: 100.0000 mg | Freq: Every day | INTRAMUSCULAR | Status: DC

## 2020-01-04 MED ORDER — ALBUTEROL SULFATE (2.5 MG/3ML) 0.083% IN NEBU
2.5000 mg | INHALATION_SOLUTION | RESPIRATORY_TRACT | Status: DC | PRN
  Administered 2020-01-07: 2.5 mg via RESPIRATORY_TRACT

## 2020-01-04 MED ORDER — LORAZEPAM 1 MG PO TABS
1.0000 mg | ORAL_TABLET | ORAL | Status: AC | PRN
  Administered 2020-01-05 (×2): 1 mg via ORAL
  Administered 2020-01-05: 2 mg via ORAL
  Administered 2020-01-06 (×2): 1 mg via ORAL
  Filled 2020-01-04: qty 1
  Filled 2020-01-04: qty 2
  Filled 2020-01-04 (×3): qty 1

## 2020-01-04 MED ORDER — THIAMINE HCL 100 MG PO TABS
100.0000 mg | ORAL_TABLET | Freq: Every day | ORAL | Status: DC
  Administered 2020-01-05 – 2020-01-14 (×5): 100 mg via ORAL
  Filled 2020-01-04 (×8): qty 1

## 2020-01-04 MED ORDER — HEPARIN SODIUM (PORCINE) 5000 UNIT/ML IJ SOLN
5000.0000 [IU] | Freq: Three times a day (TID) | INTRAMUSCULAR | Status: DC
  Administered 2020-01-04 – 2020-01-10 (×17): 5000 [IU] via SUBCUTANEOUS
  Filled 2020-01-04 (×21): qty 1

## 2020-01-04 MED ORDER — POTASSIUM CHLORIDE 20 MEQ PO PACK: 40.0000 meq | PACK | ORAL | Status: AC

## 2020-01-04 MED ORDER — SENNOSIDES-DOCUSATE SODIUM 8.6-50 MG PO TABS: 1.0000 | ORAL_TABLET | Freq: Every evening | ORAL | Status: DC | PRN

## 2020-01-04 MED ORDER — BUSPIRONE HCL 5 MG PO TABS
10.0000 mg | ORAL_TABLET | Freq: Three times a day (TID) | ORAL | Status: DC
  Administered 2020-01-05 – 2020-01-14 (×14): 10 mg via ORAL
  Filled 2020-01-04 (×5): qty 2
  Filled 2020-01-04: qty 1
  Filled 2020-01-04 (×14): qty 2

## 2020-01-04 MED ORDER — ESCITALOPRAM OXALATE 10 MG PO TABS
10.0000 mg | ORAL_TABLET | Freq: Every day | ORAL | Status: DC
  Administered 2020-01-05 – 2020-01-14 (×5): 10 mg via ORAL
  Filled 2020-01-04 (×7): qty 1

## 2020-01-04 MED ORDER — NICOTINE 21 MG/24HR TD PT24
21.0000 mg | MEDICATED_PATCH | Freq: Once | TRANSDERMAL | Status: AC
  Administered 2020-01-04: 21 mg via TRANSDERMAL
  Filled 2020-01-04: qty 1

## 2020-01-04 MED ORDER — MAGNESIUM SULFATE 2 GM/50ML IV SOLN
2.0000 g | Freq: Once | INTRAVENOUS | Status: AC
  Administered 2020-01-04: 2 g via INTRAVENOUS
  Filled 2020-01-04: qty 50

## 2020-01-04 MED ORDER — LORAZEPAM 2 MG/ML IJ SOLN
1.0000 mg | INTRAMUSCULAR | Status: AC | PRN
  Administered 2020-01-04: 1 mg via INTRAVENOUS
  Filled 2020-01-04: qty 1

## 2020-01-04 MED ORDER — GUAIFENESIN ER 600 MG PO TB12
600.0000 mg | ORAL_TABLET | Freq: Two times a day (BID) | ORAL | Status: DC
  Administered 2020-01-05 – 2020-01-14 (×10): 600 mg via ORAL
  Filled 2020-01-04 (×14): qty 1

## 2020-01-04 MED ORDER — ACETAMINOPHEN 650 MG RE SUPP: 650.0000 mg | Freq: Four times a day (QID) | RECTAL | Status: DC | PRN

## 2020-01-04 NOTE — H&P (Signed)
History and Physical  [name] SVX:[id number] DOB: 1951-05-19 DOA: 01/04/2020  Referring physician:  Franchot Heidelberg, PA PCP: Wenda Low, MD  Outpatient Specialists: none Patient coming from: Home At her baseline ambulates independently  Chief Complaint: Nausea and abdominal pain for several days  HPI: [name] is a 69 y.o. female with medical history significant for ongoing alcoholism who presents on 01/04/2020 with 3 days of increased fatigue, nausea with emesis and abdominal pain.  Patient states she also has some degree of abdominal pain but does worsen outside of what is typical for her over the past 3 days with no clear association with eating.  She did have associated nausea and episodes of emesis but unclear if this was bilious, but denies any blood.  She admits to drinking 2-3 bottles of wine daily.  Her stepdaughter is also at bedside and provides further history stating that for several months her stepmother has declined with worsening weakness.  Patient was evaluated by her PCP on 6/24 who noted significant leukocytosis and encourage patient to come to the ED but patient declined until today.  Patient admits to smoking 2 packs cigarettes a day and use her albuterol inhaler as needed.  Is unable to specify she has had to use them more often than not.  States she has baseline shortness of breath with occasional cough.  Denies any fevers or chills.  In the ED patient was found to have no fever, with blood pressure 93/64.  Initial lactic acid of 2.3 improved 1.1. SARS PCR negative.  Troponin 4  Ammonia 47, Tylenol levels less than 10, alcohol level is less than 10. Magnesium 1.8, Phos 3.8, lipase 52, INR 1.3  Potassium 3.3, BUN 26, creatinine 1, WBC 16.6, hemoglobin 10  AST 135, ALT 150, alk phos 152, total bili 3.9, direct bilirubin 1.8, BNP 116.   Chest x-ray showed very small stable right-sided pleural effusion.  Right upper quadrant ultrasound showed suggestion  of diffuse hepatocellular disease or possible cirrhosis as well as suggestive of portal hypertension with moderate ascites.  ED provider consulted GI. Patient received IV Solu-Medrol, albuterol and DuoNebs nebulizers as well as small 250 cc bolus of normal saline.  [facility] service was called for further management and evaluation.    Review of Systems:As mentioned in the history of present illness.Review of systems are otherwise negative Patient seen in the ED.   Past Medical History:  Diagnosis Date  . Anemia   . Cancer [facility]) 2007   lung cancer, right, chemo done  . Complication of anesthesia 2007   slow to awaken  . History of blood transfusion 2010  . Hypertension   . Occasional tremors    Past Surgical History:  Procedure Laterality Date  . COLONOSCOPY WITH PROPOFOL N/A 11/28/2012   Procedure: COLONOSCOPY WITH PROPOFOL;  Surgeon: Garlan Fair, MD;  Location: [facility];  Service: Endoscopy;  Laterality: N/A;  . REVERSE SHOULDER ARTHROPLASTY Left 08/14/2018   Procedure: REVERSE SHOULDER ARTHROPLASTY;  Surgeon: Netta Cedars, MD;  Location: [place];  Service: Orthopedics;  Laterality: Left;  . right lung lobectomy  2007   Allergies  Allergen Reactions  . Tape Other (See Comments)    Patient PREFERS paper tape   Social History:  reports that she has been smoking cigarettes. She has a 75.00 pack-year smoking history. She has never used smokeless tobacco. She reports current alcohol use of about 7.0 standard drinks of alcohol per week. She reports that she does not use drugs.  Family History  Problem Relation Age of Onset  . Cancer Mother        bladder  . Heart disease Father   . Diabetes Father       Prior to Admission medications   Medication Sig Start Date End Date Taking? Authorizing Provider  atorvastatin (LIPITOR) 10 MG tablet Take 10 mg by mouth daily.    [provider]  busPIRone (BUSPAR) 10 MG tablet Take 1 tablet (10 mg total) by mouth 3  (three) times daily. 08/25/18   Shelly Coss, MD  escitalopram (LEXAPRO) 10 MG tablet Take 1 tablet (10 mg total) by mouth daily. 08/25/18   Shelly Coss, MD  folic acid (FOLVITE) 1 MG tablet Take 1 tablet (1 mg total) by mouth daily. 08/26/18   Shelly Coss, MD  hydrocortisone-pramoxine [facility]) 2.5-1 % rectal cream PLACE RECTALLY 4 (FOUR) TIMES DAILY. FOR IRRITATED AND PAINFUL HEMORRHOIDS Patient taking differently: Place 1 application rectally 4 (four) times daily as needed (for irritated and painful hemorrhoids).     Michael Boston, MD  magnesium oxide (MAG-OX) 400 (241.3 Mg) MG tablet Take 1 tablet (400 mg total) by mouth 2 (two) times daily. 08/25/18   Shelly Coss, MD  Multiple Vitamin (MULTIVITAMIN) tablet Take 1 tablet by mouth daily.    [provider]  potassium chloride 20 MEQ/15ML (10%) SOLN Take 15 mLs (20 mEq total) by mouth daily for 14 doses. 08/26/18 09/09/18  Shelly Coss, MD  PROAIR HFA 108 (90 Base) MCG/ACT inhaler Inhale 2 puffs into the lungs every 4 (four) hours as needed. 04/20/18   [provider]  thiamine 100 MG tablet Take 1 tablet (100 mg total) by mouth daily. 08/26/18   Shelly Coss, MD  traMADol (ULTRAM) 50 MG tablet Take 1 tablet (50 mg total) by mouth every 6 (six) hours as needed for moderate pain. 08/25/18   Shelly Coss, MD    Physical Exam: BP 103/64 (BP Location: Right Arm)   Pulse 97   Temp 98.4 F (36.9 C) (Oral)   Resp 19   Ht 5' 3.5" (1.613 m)   Wt 49.9 kg   SpO2 98%   BMI 19.18 kg/m   Constitutional disheveled elderly female, in no acute distress Eyes: EOMI, anicteric, normal conjunctivae ENMT: Oropharynx with moist mucous membranes, poor dentition Cardiovascular: RRR no MRGs, with no peripheral edema Respiratory: Audible wheezing, diffuse wheezing in all lung fields, no accessory muscle usage, conversational dyspnea present Abdomen: Soft,non-tender, 2+ pitting edema of abdomen, fluid wave present, no  rebound tenderness or guarding Skin: Scattered bruises on bilateral legs Neurologic: Easily distractible, able to follow simple commands, moving all extremities on her own Psychiatric: Alert and oriented x4, irritable          Labs on Admission:  Basic Metabolic Panel: Recent Labs  Lab 01/04/20 1220  NA 136  K 3.3*  CL 96*  CO2 28  GLUCOSE 143*  BUN 26*  CREATININE 1.00  CALCIUM 8.4*   Liver Function Tests: Recent Labs  Lab 01/04/20 1220  AST 135*  ALT 50*  ALKPHOS 152*  BILITOT 3.9*  PROT 5.7*  ALBUMIN 2.3*   Recent Labs  Lab 01/04/20 1343  LIPASE 52*   Recent Labs  Lab 01/04/20 1605  AMMONIA 47*   CBC: Recent Labs  Lab 01/04/20 1220  WBC 16.6*  HGB 10.1*  HCT 31.0*  MCV 110.7*  PLT 200   Cardiac Enzymes: No results for input(s): CKTOTAL, CKMB, CKMBINDEX, TROPONINI in the last 168 hours.  BNP (last 3 results) Recent Labs    01/04/20 1220  BNP 116.8*    ProBNP (last 3 results) No results for input(s): PROBNP in the last 8760 hours.  CBG: Recent Labs  Lab 01/04/20 1249  GLUCAP 126*    Radiological Exams on Admission: DG Chest 2 View  Result Date: 01/04/2020 CLINICAL DATA:  Weakness. EXAM: CHEST - 2 VIEW COMPARISON:  August 26, 2018 FINDINGS: There is elevation of the right hemidiaphragm with subsequent stable right-sided volume loss. Mild, stable linear scarring is seen within the right upper lobe. There is no evidence of acute infiltrate. A very small, stable right pleural effusion is seen. No pneumothorax is identified. The heart size and mediastinal contours are within normal limits. Marked severity scoliosis of the thoracic spine is noted. IMPRESSION: 1. Stable right-sided volume loss with stable right upper lobe linear scarring. 2. Stable very small right pleural effusion. Electronically Signed   By: Virgina Norfolk M.D.   On: 01/04/2020 15:05   US Abdomen Limited RUQ  Result Date: 01/04/2020 CLINICAL DATA:  Elevated liver  function tests. EXAM: ULTRASOUND ABDOMEN LIMITED RIGHT UPPER QUADRANT COMPARISON:  August 13, 2018. FINDINGS: Gallbladder: No cholelithiasis is noted. No sonographic Murphy's sign is noted. Mild gallbladder wall thickening is noted most likely due to surrounding ascites. Common bile duct: Diameter: 3 mm which is within normal limits. Liver: 9 mm left hepatic cyst is noted which was present on prior CT scan. Increased echogenicity of hepatic parenchyma is noted suggesting diffuse hepatocellular disease. Bidirectional flow is noted in the portal vein suggesting portal hypertension. Other: Moderate ascites is noted. IMPRESSION: Increased echogenicity of hepatic parenchyma is noted suggesting diffuse hepatocellular disease or possibly cirrhosis. Bidirectional flow is noted in the portal vein suggesting portal hypertension. Moderate ascites is noted. No definite cholelithiasis is noted. Electronically Signed   By: Marijo Conception M.D.   On: 01/04/2020 16:27      Assessment/Plan Present on Admission: . COPD exacerbation ([place]) . Cigarette smoker . Elevated LFTs . Hypokalemia . Hypotension due to hypovolemia . Alcoholic hepatitis with ascites  Active Problems:   Cigarette smoker   Elevated LFTs   Hypokalemia   Hypotension due to hypovolemia   COPD exacerbation (HCC)   Alcoholic hepatitis with ascites   Alcoholic hepatitis with moderate ascites Suspect abdominal pain, nausea and intermittent vomiting could be related to alcoholic hepatitis (given elevated AST to a ALT, elevated bilirubin, alk phos) in the setting of moderate ascites is evident by right upper quadrant ultrasound.  Very concerning for cirrhosis given liver imaging findings.  Evidence of ascites on right upper quadrant ultrasound and seems evident on physical exam as well, will need to rule out SBP -Follow INR -Follow CMP -Continue supportive care with antiemetics and careful pain control -Avoid sedatives -GI consulted -IR  consulted for potential paracentesis for both therapeutic and diagnostic evaluation  Hypotension, suspect third spacing related to hypoalbuminemia in the setting of possible liver dysfunction If patient does have cirrhosis or some hepatic dysfunction this could be etiology.  Looks volume up particularly in abdomen with 2+ pitting edema -We will hold off on IV fluids and closely monitor since patient able to keep SBP in the 90s  Hypokalemia In the setting of admitted emesis episodes.  Mag a bit low 1.8 -Give IV magnesium, repeat in a.m. -Repeat BMP in a.m. as well  COPD exacerbation Could be exacerbated in setting of alcoholic hepatitis and ascites.  Has significant wheezing throughout all lung fields with some  scant conversational dyspnea -IV Solu-Medrol, scheduled duo nebs, flutter valve, incentive spirometry, Mucinex scheduled -Closely monitor -Encouraged tobacco cessation, stop smoking 2 packs/day  Alcoholism Admits to drinking 2-3 bottles of wine daily.  Alcohol level on admission less than 10 -Closely monitor on CIWA protocol -Folic acid, thiamine, multivitamin  Mood disorder -Continue home BuSpar, Lexapro   DVT prophylaxis: heparin subcut  Code Status: DNR   Family Communication: Updated stepdaughter Angela bedside  Disposition Plan: Expect patient to stay greater than 2 midnights given need for IV Solu-Medrol and scheduled inhalers for significant COPD exacerbation, as well as close monitoring of abdominal pain nausea and vomiting related to alcohol hepatitis and likely had ascites as well as potential need for IR paracentesis, patient also needs close monitoring for alcohol withdrawal given his significant alcohol abuse.  Consults called: GI ( by ED provider)   Admission status: Admitted as inpatient to telemetry unit      Desiree Hane MD [facility]  Pager [phone]  If 7PM-7AM, please contact night-coverage [email or web address] Password  [facility]  01/04/2020, 5:21 PM

## 2020-01-04 NOTE — ED Triage Notes (Signed)
Per pt, states she has been extremely fatigued going on 3 days now, denies pain-shakey

## 2020-01-04 NOTE — Progress Notes (Signed)
[facility]  We were asked to consult on this patient but she has an [place] primary physician so she will be seen by [facility].  I have called their office and was told that Dr. Penelope Coop would be seeing the patient later today.  Ellouise Newer, Pa-C

## 2020-01-04 NOTE — ED Provider Notes (Signed)
[place] DEPT Provider Note   CSN: [id number] Arrival date & time: 01/04/20  1149     History Chief Complaint  Patient presents with  . Fatigue    [name] is a 69 y.o. female presenting for evaluation of generalized weakness.  Patient states she does not feel well.  She reports this has been going on for 2 to 3 days.  She states she is here because her step daughter made her come.  Patient denies fevers, chills, worsening cough, nausea, vomiting, abdominal pain, change in urination or bowel movements.  Additional history obtained from patient's stepdaughter.  Per stepdaughter, patient has had gradually worsening fatigue and weakness over the past several months.  In the past few weeks, this has been significantly worse.  Patient now has difficulty getting from her bed to the bathroom due to weakness.  Stepdaughter lives in [place], has been staying with her mom for the past 2 weeks.  Noticed a functional decline.  Stepdaughter reports mom drinks 2-3 bottles of wine a day, smokes 3 packs of cigarettes a day. Step daughter states pt has had some vomiting. Pt saw the pcp yesterday, labs work showed leukocytosis, and pt was encouraged to come to the ED to r/o sepsis.   Additional history obtained from chart review.  Patient with a history of anemia, lung cancer status post lobectomy and chemo, hypertension, alcohol use, COPD  HPI     Past Medical History:  Diagnosis Date  . Anemia   . Cancer [facility]) 2007   lung cancer, right, chemo done  . Complication of anesthesia 2007   slow to awaken  . History of blood transfusion 2010  . Hypertension   . Occasional tremors     Patient Active Problem List   Diagnosis Date Noted  . COPD exacerbation ([place]) 01/04/2020  . Alcoholic hepatitis with ascites 01/04/2020  . Malnutrition of moderate degree 08/14/2018  . 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 Alcohol dependence with withdrawal  ([place]) 08/13/2018  . Elevated LFTs 08/13/2018  . Hypokalemia 08/13/2018  . Macrocytic anemia 08/13/2018  . Hypotension due to hypovolemia 08/13/2018  . Closed fracture of left humerus 08/13/2018  . Tachycardia   . Closed left humeral fracture 08/12/2018  . Solitary pulmonary nodule on lung CT 06/24/2018  . COPD pfts pending/ active smoker 06/24/2018  . Cigarette smoker 06/24/2018  . Tongue ulceration 06/14/2018  . Personal history of colonic polyps 01/29/2013  . Internal prolapsed hemorrhoids 01/29/2013    Past Surgical History:  Procedure Laterality Date  . COLONOSCOPY WITH PROPOFOL N/A 11/28/2012   Procedure: COLONOSCOPY WITH PROPOFOL;  Surgeon: Garlan Fair, MD;  Location: [facility];  Service: Endoscopy;  Laterality: N/A;  . REVERSE SHOULDER ARTHROPLASTY Left 08/14/2018   Procedure: REVERSE SHOULDER ARTHROPLASTY;  Surgeon: Netta Cedars, MD;  Location: [place];  Service: Orthopedics;  Laterality: Left;  . right lung lobectomy  2007     OB History   No obstetric history on file.     Family History  Problem Relation Age of Onset  . Cancer Mother        bladder  . Heart disease Father   . Diabetes Father     Social History   Tobacco Use  . Smoking status: Current Some Day Smoker    Packs/day: 3.00    Years: 25.00    Pack years: 75.00    Types: Cigarettes  . Smokeless tobacco: Never Used  Substance Use Topics  .  Alcohol use: Yes    Alcohol/week: 7.0 standard drinks    Types: 7 Glasses of wine per week    Comment: 1 glass winde daily  . Drug use: No    Home Medications Prior to Admission medications   Medication Sig Start Date End Date Taking? Authorizing Provider  atorvastatin (LIPITOR) 10 MG tablet Take 10 mg by mouth daily.    [provider]  busPIRone (BUSPAR) 10 MG tablet Take 1 tablet (10 mg total) by mouth 3 (three) times daily. 08/25/18   Shelly Coss, MD  escitalopram (LEXAPRO) 10 MG tablet Take 1 tablet (10 mg total) by mouth daily.  08/25/18   Shelly Coss, MD  folic acid (FOLVITE) 1 MG tablet Take 1 tablet (1 mg total) by mouth daily. 08/26/18   Shelly Coss, MD  hydrocortisone-pramoxine [facility]) 2.5-1 % rectal cream PLACE RECTALLY 4 (FOUR) TIMES DAILY. FOR IRRITATED AND PAINFUL HEMORRHOIDS Patient taking differently: Place 1 application rectally 4 (four) times daily as needed (for irritated and painful hemorrhoids).     Michael Boston, MD  magnesium oxide (MAG-OX) 400 (241.3 Mg) MG tablet Take 1 tablet (400 mg total) by mouth 2 (two) times daily. 08/25/18   Shelly Coss, MD  Multiple Vitamin (MULTIVITAMIN) tablet Take 1 tablet by mouth daily.    [provider]  potassium chloride 20 MEQ/15ML (10%) SOLN Take 15 mLs (20 mEq total) by mouth daily for 14 doses. 08/26/18 09/09/18  Shelly Coss, MD  PROAIR HFA 108 (90 Base) MCG/ACT inhaler Inhale 2 puffs into the lungs every 4 (four) hours as needed. 04/20/18   [provider]  thiamine 100 MG tablet Take 1 tablet (100 mg total) by mouth daily. 08/26/18   Shelly Coss, MD  traMADol (ULTRAM) 50 MG tablet Take 1 tablet (50 mg total) by mouth every 6 (six) hours as needed for moderate pain. 08/25/18   Shelly Coss, MD    Allergies    Codeine and Tape  Review of Systems   Review of Systems  Gastrointestinal: Positive for nausea and vomiting.  Neurological: Positive for weakness.  All other systems reviewed and are negative.   Physical Exam Updated Vital Signs BP 102/77   Pulse (!) 101   Temp 98.4 F (36.9 C) (Oral)   Resp 19   Ht 5' 3.5" (1.613 m)   Wt 49.9 kg   SpO2 98%   BMI 19.18 kg/m   Physical Exam Vitals and nursing note reviewed.  Constitutional:      General: She is not in acute distress.    Appearance: She is well-developed.     Comments: Appears ill, grey-ish skin tone  HENT:     Head: Normocephalic and atraumatic.  Eyes:     Conjunctiva/sclera: Conjunctivae normal.     Pupils: Pupils are equal, round, and reactive  to light.  Cardiovascular:     Rate and Rhythm: Normal rate and regular rhythm.     Pulses: Normal pulses.  Pulmonary:     Effort: Pulmonary effort is normal. No respiratory distress.     Breath sounds: Normal breath sounds. No wheezing.  Abdominal:     General: There is distension.     Palpations: Abdomen is soft. There is no mass.     Tenderness: There is abdominal tenderness. There is no guarding or rebound.     Comments: Mild abdominal distention.  Tenderness palpation of the upper abdomen.  No rigidity, guarding.  No peritonitis.  Negative rebound.  Musculoskeletal:  General: Normal range of motion.     Cervical back: Normal range of motion and neck supple.     Right lower leg: Edema present.     Left lower leg: Edema present.     Comments: 1+ pitting edema bilaterally.  Color change of bilateral distal feet, baseline per patient and stepdaughter.  Skin:    General: Skin is warm and dry.     Capillary Refill: Capillary refill takes less than 2 seconds.  Neurological:     Mental Status: She is alert and oriented to person, place, and time.     ED Results / Procedures / Treatments   Labs (all labs ordered are listed, but only abnormal results are displayed) Labs Reviewed  BASIC METABOLIC PANEL - Abnormal; Notable for the following components:      Result Value   Potassium 3.3 (*)    Chloride 96 (*)    Glucose, Bld 143 (*)    BUN 26 (*)    Calcium 8.4 (*)    GFR calc non Af Amer 57 (*)    All other components within normal limits  CBC - Abnormal; Notable for the following components:   WBC 16.6 (*)    RBC 2.80 (*)    Hemoglobin 10.1 (*)    HCT 31.0 (*)    MCV 110.7 (*)    MCH 36.1 (*)    All other components within normal limits  HEPATIC FUNCTION PANEL - Abnormal; Notable for the following components:   Total Protein 5.7 (*)    Albumin 2.3 (*)    AST 135 (*)    ALT 50 (*)    Alkaline Phosphatase 152 (*)    Total Bilirubin 3.9 (*)    Bilirubin, Direct 1.8  (*)    Indirect Bilirubin 2.1 (*)    All other components within normal limits  BRAIN NATRIURETIC PEPTIDE - Abnormal; Notable for the following components:   B Natriuretic Peptide 116.8 (*)    All other components within normal limits  LIPASE, BLOOD - Abnormal; Notable for the following components:   Lipase 52 (*)    All other components within normal limits  PROTIME-INR - Abnormal; Notable for the following components:   Prothrombin Time 15.6 (*)    INR 1.3 (*)    All other components within normal limits  LACTIC ACID, PLASMA - Abnormal; Notable for the following components:   Lactic Acid, Venous 2.3 (*)    All other components within normal limits  AMMONIA - Abnormal; Notable for the following components:   Ammonia 47 (*)    All other components within normal limits  ACETAMINOPHEN LEVEL - Abnormal; Notable for the following components:   Acetaminophen (Tylenol), Serum <10 (*)    All other components within normal limits  CBG MONITORING, ED - Abnormal; Notable for the following components:   Glucose-Capillary 126 (*)    All other components within normal limits  SARS CORONAVIRUS 2 BY RT PCR (HOSPITAL ORDER, [place] [facility])  URINE CULTURE  LACTIC ACID, PLASMA  ETHANOL  MAGNESIUM  PHOSPHORUS  URINALYSIS, ROUTINE W REFLEX MICROSCOPIC  PROTIME-INR  COMPREHENSIVE METABOLIC PANEL  CBC  HIV ANTIBODY (ROUTINE TESTING W REFLEX)  TROPONIN I (HIGH SENSITIVITY)  TROPONIN I (HIGH SENSITIVITY)    EKG None  Radiology DG Chest 2 View  Result Date: 01/04/2020 CLINICAL DATA:  Weakness. EXAM: CHEST - 2 VIEW COMPARISON:  August 26, 2018 FINDINGS: There is elevation of the right hemidiaphragm with subsequent stable right-sided  volume loss. Mild, stable linear scarring is seen within the right upper lobe. There is no evidence of acute infiltrate. A very small, stable right pleural effusion is seen. No pneumothorax is identified. The heart size and mediastinal  contours are within normal limits. Marked severity scoliosis of the thoracic spine is noted. IMPRESSION: 1. Stable right-sided volume loss with stable right upper lobe linear scarring. 2. Stable very small right pleural effusion. Electronically Signed   By: Virgina Norfolk M.D.   On: 01/04/2020 15:05   US Abdomen Limited RUQ  Result Date: 01/04/2020 CLINICAL DATA:  Elevated liver function tests. EXAM: ULTRASOUND ABDOMEN LIMITED RIGHT UPPER QUADRANT COMPARISON:  August 13, 2018. FINDINGS: Gallbladder: No cholelithiasis is noted. No sonographic Murphy's sign is noted. Mild gallbladder wall thickening is noted most likely due to surrounding ascites. Common bile duct: Diameter: 3 mm which is within normal limits. Liver: 9 mm left hepatic cyst is noted which was present on prior CT scan. Increased echogenicity of hepatic parenchyma is noted suggesting diffuse hepatocellular disease. Bidirectional flow is noted in the portal vein suggesting portal hypertension. Other: Moderate ascites is noted. IMPRESSION: Increased echogenicity of hepatic parenchyma is noted suggesting diffuse hepatocellular disease or possibly cirrhosis. Bidirectional flow is noted in the portal vein suggesting portal hypertension. Moderate ascites is noted. No definite cholelithiasis is noted. Electronically Signed   By: Marijo Conception M.D.   On: 01/04/2020 16:27    Procedures Procedures (including critical care time)  Medications Ordered in ED Medications  nicotine (NICODERM CQ - dosed in mg/24 hours) patch 21 mg (21 mg Transdermal Patch Applied 01/04/20 1405)  albuterol (PROVENTIL) (2.5 MG/3ML) 0.083% nebulizer solution 2.5 mg (has no administration in time range)  ipratropium-albuterol (DUONEB) 0.5-2.5 (3) MG/3ML nebulizer solution 3 mL (3 mLs Nebulization Not Given 01/04/20 1709)  LORazepam (ATIVAN) tablet 1-4 mg ( Oral See Alternative 01/04/20 1800)    Or  LORazepam (ATIVAN) injection 1-4 mg (1 mg Intravenous Given 01/04/20 1800)   thiamine tablet 100 mg (100 mg Oral Not Given 01/04/20 1821)    Or  thiamine (B-1) injection 100 mg ( Intravenous See Alternative 10/23/22 4010)  folic acid (FOLVITE) tablet 1 mg (has no administration in time range)  multivitamin with minerals tablet 1 tablet (has no administration in time range)  ondansetron (ZOFRAN) injection 4 mg (4 mg Intravenous Given 01/04/20 1738)  methylPREDNISolone sodium succinate (SOLU-MEDROL) 125 mg/2 mL injection 60 mg (has no administration in time range)  traMADol (ULTRAM) tablet 50 mg (has no administration in time range)  busPIRone (BUSPAR) tablet 10 mg (10 mg Oral Not Given 01/04/20 1822)  escitalopram (LEXAPRO) tablet 10 mg (10 mg Oral Not Given 01/04/20 1822)  potassium chloride (KLOR-CON) packet 40 mEq (has no administration in time range)  heparin injection 5,000 Units (has no administration in time range)  acetaminophen (TYLENOL) tablet 650 mg (has no administration in time range)    Or  acetaminophen (TYLENOL) suppository 650 mg (has no administration in time range)  senna-docusate (Senokot-S) tablet 1 tablet (has no administration in time range)  guaiFENesin (MUCINEX) 12 hr tablet 600 mg (has no administration in time range)  sodium chloride 0.9 % bolus 250 mL (0 mLs Intravenous Stopped 01/04/20 1407)  sodium chloride 0.9 % bolus 1,000 mL (0 mLs Intravenous Stopped 01/04/20 1801)    ED Course  I have reviewed the triage vital signs and the nursing notes.  Pertinent labs & imaging results that were available during my care of the patient were reviewed  by me and considered in my medical decision making (see chart for details).  Clinical Course as of Jan 03 1833  Fri Jan 03, 5830  7169 69 year old female from home for evaluation of fatigue going on for days.  Significant daily alcohol use and tobacco.  Patient here looks fatigued blood pressure soft.  Some scleral icterus.  Labs showing elevation in LFTs.  Has a white count.  Getting some imaging.   Likely will need admission to the hospital for further management.   [MB]    Clinical Course User Index [MB] Hayden Rasmussen, MD   MDM Rules/Calculators/A&P                          Patient presented for evaluation of worsening fatigue.  On exam, patient appears ill, chronically so.  Blood pressure soft, heart rate high normal.  On my exam, she is abdominal distention and upper abdominal tenderness as well as pitting edema.  Concern for cirrhosis/liver failure.  Also consider cholecystitis/choledocholithiasis.  Consider infection such as UTI or pneumonia.  Consider anemia.  Consider electrolyte abnormality.  Will obtain labs, x-ray, ultrasound, urine.  Soft fluids for blood pressure.  Labs interpreted by me, shows mild leukocytosis of 16.6.  Hemoglobin at 10, was 14 8 months ago.  However patient with a history of anemia and it has been 11 in the past year.  Electrolytes stable.  LFTs elevated, as well as elevated bili at 3.9.  Ultrasound pending.  We will add on PT/INR, BNP, ammonia.  Initial lactic mildly elevated at 2.3.  Chest x-ray viewed interpreted by me, no pneumonia, pneumothorax.  Shows previous right lobectomy.  Per radiology, small right pleural effusion.  PT/INR just above normal.  Ammonia mildly elevated at 47.  Ultrasound without obvious gallstone.  Mild gallbladder wall thickening, thought to be due to cirrhosis/ascites.  Significant ascites noted on ultrasound.  Will consult with GI and admit to medicine. Discussed with attending, Dr. Melina Copa evaluated the pt.   Discussed with Dr. Fabio Asa from [place].  As patient is an Animal nutritionist PCP patient, also discussed with Dr. Penelope Coop from [place], who will consult patient is hospitalized.  Discussed with Dr. Lonny Prude from [facility] hospital[facility] service, patient to be admitted.  Final Clinical Impression(s) / ED Diagnoses Final diagnoses:  Elevated LFTs  Alcoholic cirrhosis of liver with ascites (HCC)  Fatigue, unspecified type  Pleural  effusion on right    Rx / DC Orders ED Discharge Orders    None       Franchot Heidelberg, PA-C 01/04/20 1834    Hayden Rasmussen, MD 01/05/20 [phone]

## 2020-01-05 ENCOUNTER — Inpatient Hospital Stay (HOSPITAL_COMMUNITY): Payer: Medicare Other

## 2020-01-05 DIAGNOSIS — D539 Nutritional anemia, unspecified: Secondary | ICD-10-CM

## 2020-01-05 DIAGNOSIS — L8990 Pressure ulcer of unspecified site, unspecified stage: Secondary | ICD-10-CM | POA: Insufficient documentation

## 2020-01-05 DIAGNOSIS — F1020 Alcohol dependence, uncomplicated: Secondary | ICD-10-CM

## 2020-01-05 DIAGNOSIS — F17200 Nicotine dependence, unspecified, uncomplicated: Secondary | ICD-10-CM

## 2020-01-05 LAB — CBC
HCT: 28.4 % — ABNORMAL LOW (ref 36.0–46.0)
Hemoglobin: 9.1 g/dL — ABNORMAL LOW (ref 12.0–15.0)
MCH: 36.3 pg — ABNORMAL HIGH (ref 26.0–34.0)
MCHC: 32 g/dL (ref 30.0–36.0)
MCV: 113.1 fL — ABNORMAL HIGH (ref 80.0–100.0)
Platelets: 184 10*3/uL (ref 150–400)
RBC: 2.51 MIL/uL — ABNORMAL LOW (ref 3.87–5.11)
RDW: 12.9 % (ref 11.5–15.5)
WBC: 10.4 10*3/uL (ref 4.0–10.5)
nRBC: 0 % (ref 0.0–0.2)

## 2020-01-05 LAB — BODY FLUID CELL COUNT WITH DIFFERENTIAL
Eos, Fluid: 0 %
Lymphs, Fluid: 5 %
Monocyte-Macrophage-Serous Fluid: 83 % (ref 50–90)
Neutrophil Count, Fluid: 12 % (ref 0–25)
Total Nucleated Cell Count, Fluid: 42 cu mm (ref 0–1000)

## 2020-01-05 LAB — COMPREHENSIVE METABOLIC PANEL
ALT: 44 U/L (ref 0–44)
AST: 110 U/L — ABNORMAL HIGH (ref 15–41)
Albumin: 2.2 g/dL — ABNORMAL LOW (ref 3.5–5.0)
Alkaline Phosphatase: 133 U/L — ABNORMAL HIGH (ref 38–126)
Anion gap: 11 (ref 5–15)
BUN: 26 mg/dL — ABNORMAL HIGH (ref 8–23)
CO2: 26 mmol/L (ref 22–32)
Calcium: 8 mg/dL — ABNORMAL LOW (ref 8.9–10.3)
Chloride: 99 mmol/L (ref 98–111)
Creatinine, Ser: 0.78 mg/dL (ref 0.44–1.00)
GFR calc Af Amer: >60 mL/min (ref >60)
GFR calc non Af Amer: >60 mL/min (ref >60)
Glucose, Bld: 141 mg/dL — ABNORMAL HIGH (ref 70–99)
Potassium: 4.2 mmol/L (ref 3.5–5.1)
Sodium: 136 mmol/L (ref 135–145)
Total Bilirubin: 3.4 mg/dL — ABNORMAL HIGH (ref 0.3–1.2)
Total Protein: 5.3 g/dL — ABNORMAL LOW (ref 6.5–8.1)

## 2020-01-05 LAB — GLUCOSE, PLEURAL OR PERITONEAL FLUID: Glucose, Fluid: 138 mg/dL

## 2020-01-05 LAB — PROTEIN, PLEURAL OR PERITONEAL FLUID: Total protein, fluid: <3 g/dL

## 2020-01-05 LAB — GRAM STAIN

## 2020-01-05 LAB — URINALYSIS, ROUTINE W REFLEX MICROSCOPIC
Bilirubin Urine: NEGATIVE
Glucose, UA: NEGATIVE mg/dL
Hgb urine dipstick: NEGATIVE
Ketones, ur: NEGATIVE mg/dL
Leukocytes,Ua: NEGATIVE
Nitrite: NEGATIVE
Protein, ur: NEGATIVE mg/dL
Specific Gravity, Urine: 1.016 (ref 1.005–1.030)
pH: 5 (ref 5.0–8.0)

## 2020-01-05 LAB — PROTIME-INR
INR: 1.4 — ABNORMAL HIGH (ref 0.8–1.2)
Prothrombin Time: 16.5 seconds — ABNORMAL HIGH (ref 11.4–15.2)

## 2020-01-05 LAB — MAGNESIUM: Magnesium: 2.3 mg/dL (ref 1.7–2.4)

## 2020-01-05 MED ORDER — LIDOCAINE HCL 1 % IJ SOLN
INTRAMUSCULAR | Status: AC
  Filled 2020-01-05: qty 20

## 2020-01-05 MED ORDER — FUROSEMIDE 20 MG PO TABS
20.0000 mg | ORAL_TABLET | Freq: Every day | ORAL | Status: DC
  Administered 2020-01-05 – 2020-01-14 (×5): 20 mg via ORAL
  Filled 2020-01-05 (×7): qty 1

## 2020-01-05 MED ORDER — SPIRONOLACTONE 25 MG PO TABS
25.0000 mg | ORAL_TABLET | Freq: Every day | ORAL | Status: DC
  Administered 2020-01-05: 25 mg via ORAL
  Filled 2020-01-05: qty 1

## 2020-01-05 MED ORDER — ORAL CARE MOUTH RINSE
15.0000 mL | Freq: Two times a day (BID) | OROMUCOSAL | Status: DC
  Administered 2020-01-05 – 2020-01-10 (×6): 15 mL via OROMUCOSAL

## 2020-01-05 NOTE — Procedures (Signed)
PROCEDURE SUMMARY:  Successful US guided paracentesis from RLQ.  Yielded 2.4 L of clear yellow fluid.  No immediate complications.  Pt tolerated well.   Specimen was sent for labs.  EBL < 15mL  Ascencion Dike PA-C 01/05/2020 12:51 PM

## 2020-01-05 NOTE — Progress Notes (Signed)
PROGRESS NOTE  [name] QZR:[id number] DOB: December 13, 1950 DOA: 01/04/2020 PCP: Wenda Low, MD  Brief History   69 year old woman PMH alcoholism with daily use, presented with increasing fatigue, nausea, vomiting and abdominal pain.  Drinks 2-3 bottles of wine daily.  Admitted for alcoholic hepatitis with moderate ascites  A & P  Alcoholic hepatitis with moderate ascites, elevated LFTs, elevated INR --Right upper quadrant ultrasound suggests diffuse hepatocellular disease or possible cirrhosis, portal hypertension.  Suspect cirrhosis. -- Status post ultrasound guided paracentesis 2.4 L clear yellow fluid, rule out SBP.  Based on exam today there is no abdominal pain.  Hold off on antibiotics. --Maddrey's Discriminant Function 9.4 --Follow-up GI recommendations  Alcohol use disorder, alcoholism with daily drinking 2-3 bottles per day --Monitor closely for withdrawal. CIWA.  Macrocytic anemia, stable --Suspect secondary to B12 or folate deficiency.  Empiric replacement of folate. --Check anemia panel  Borderline hypotension likely secondary to hypoalbuminemia --Stable.  COPD exacerbation complicated by ongoing smoking.  PMH right lung cancer. --Still wheezing, short of breath.  Continue steroids, bronchodilators.  Tobacco use disorder, smokes 2 packs cigarettes per day --Nicotine patch  Disposition Plan:  Discussion: Chronically ill, continue treatment for COPD exacerbation, follow-up peritoneal fluid studies, monitor for alcohol withdrawal.  Status is: Inpatient  Remains inpatient appropriate because:IV treatments appropriate due to intensity of illness or inability to take PO   Dispo: The patient is from: Home              Anticipated d/c is to: Home              Anticipated d/c date is: 2 days              Patient currently is not medically stable to d/c.  DVT prophylaxis:  heparin injection 5,000 Units Start: 01/04/20 1730 Code Status: DNR Family Communication:  none present  Murray Hodgkins, MD  [facility] Direct contact: see [email or web address] (further directions at bottom of note if needed) 7PM-7AM contact night coverage as at bottom of note 01/05/2020, 1:46 PM  LOS: 1 day   Significant Hospital Events   . 6/25 admitted for alcoholic hepatitis with ascites   Consults:  . Gastroenterology   Procedures:  . 6/26 ultrasound-guided paracentesis 2.4 L clear yellow fluid  Significant Diagnostic Tests:  Marland Kitchen    Micro Data:  . SARS-CoV-2 negative   Antimicrobials:  .   Interval History/Subjective  Still short of breath.  Hungry.  Objective   Vitals:  Vitals:   01/05/20 1233 01/05/20 1240  BP: 100/65 101/61  Pulse:    Resp:    Temp:    SpO2:      Exam:  Constitutional.  Appears calm, comfortable, chronically ill. Respiratory.  Bilateral expiratory wheeze prominent when auscultated.  No rales or rhonchi.  Normal respiratory effort. Cardiovascular.  Regular rate and rhythm.  No murmur, rub or gallop.  No lower extremity edema.  Telemetry sinus rhythm. Abdomen.  Soft, nontender, nondistended. Skin.  Scattered ecchymoses arm and leg Musculoskeletal.  Moves extremities. Psychiatric.  Grossly normal mood and affect.  Speech fluent and appropriate.  I have personally reviewed the following:   Today's Data  . BMP unremarkable.  Potassium, magnesium within normal limits.  Phosphorus was within normal limits yesterday. . Alkaline phosphatase trending down, AST trending down.  ALT is normalized.  Total bilirubin slightly better at 3.4. . Hemoglobin slightly lower at 9.1.  MCV 113.  Scheduled Meds: . busPIRone  10 mg Oral TID  . escitalopram  10 mg Oral Daily  . folic acid  1 mg Oral Daily  . guaiFENesin  600 mg Oral BID  . heparin  5,000 Units Subcutaneous Q8H  . ipratropium-albuterol  3 mL Nebulization Q6H  . lidocaine      . mouth rinse  15 mL Mouth Rinse BID  . methylPREDNISolone (SOLU-MEDROL) injection  60 mg Intravenous Q8H   . multivitamin with minerals  1 tablet Oral Daily  . nicotine  21 mg Transdermal Once  . thiamine  100 mg Oral Daily   Or  . thiamine  100 mg Intravenous Daily   Continuous Infusions:  Principal Problem:   Alcoholic hepatitis with ascites Active Problems:   Cigarette smoker   Elevated LFTs   Hypokalemia   Macrocytic anemia   Hypotension   COPD exacerbation (HCC)   Alcoholism ([place])   Tobacco use disorder   LOS: 1 day   How to contact the [facility] Attending or Consulting provider [place] or covering provider during after hours [place], for this patient?  1. Check the care team in [facility] and look for a) attending/consulting TRH provider listed and b) the [facility] team listed 2. Log into [email or web address] and use [place] universal password to access. If you do not have the password, please contact the hospital operator. 3. Locate the [facility] provider you are looking for under [facility] and page to a number that you can be directly reached. 4. If you still have difficulty reaching the provider, please page the [facility] (Director on Call) for the Hospitalists listed on amion for assistance.

## 2020-01-05 NOTE — Consult Note (Signed)
Referring Provider:  EDP Primary Care Physician:  Wenda Low, MD Primary Gastroenterologist: Sadie Haber primary  Reason for Consultation:  possible cirrhosis  HPI: [name] is a 69 y.o. female presented to the emergency room yesterday with fatigue and weakness of 3 days duration.  Patient was also complaining of nausea vomiting and abdominal pain.  Initial work-up in the emergency room yesterday revealed mild leukocytosis WBC count of 16.6, hemoglobin of 10.1, mild elevated LFTs, mild elevated lipase is 52 and INR of 1.3.  Negative acetaminophen level.  Negative alcohol level.  Ultrasound abdomen yesterday showed portal hypertension, moderate ascites and findings concerning for possible cirrhosis.  Underwent IR guided paracentesis today with removal of 2.4 L of fluids.  Blood work today showed normalization of white count.  Hemoglobin of 9.1, MCV of 113, stable LFTs and stable INR.  GI is consulted for further evaluation and management of cirrhosis.  Last colonoscopy in May 2014 showed small tubular adenoma and finding concerning for ischemic colitis and hemorrhoids.  Patient seen and examined at bedside.  She recently received Ativan and she is not able to provide any meaningful history.  According to her, she was brought to the hospital by her niece because she was not feeling well.  Discussed with RN at bedside.  Past Medical History:  Diagnosis Date  . Anemia   . Cancer [facility] 2007   lung cancer, right, chemo done  . Complication of anesthesia 2007   slow to awaken  . History of blood transfusion 2010  . Hypertension   . Occasional tremors     Past Surgical History:  Procedure Laterality Date  . COLONOSCOPY WITH PROPOFOL N/A 11/28/2012   Procedure: COLONOSCOPY WITH PROPOFOL;  Surgeon: Garlan Fair, MD;  Location: [facility];  Service: Endoscopy;  Laterality: N/A;  . REVERSE SHOULDER ARTHROPLASTY Left 08/14/2018   Procedure: REVERSE SHOULDER ARTHROPLASTY;  Surgeon: Netta Cedars, MD;  Location: [place];  Service: Orthopedics;  Laterality: Left;  . right lung lobectomy  2007    Prior to Admission medications   Medication Sig Start Date End Date Taking? Authorizing Provider  busPIRone (BUSPAR) 10 MG tablet Take 1 tablet (10 mg total) by mouth 3 (three) times daily. Patient taking differently: Take 10 mg by mouth 3 (three) times daily as needed (for anxiety).  08/25/18  Yes Shelly Coss, MD  clonazePAM (KLONOPIN) 0.5 MG tablet Take 0.5 mg by mouth at bedtime as needed for anxiety.   Yes [provider]  primidone (MYSOLINE) 50 MG tablet Take 50 mg by mouth at bedtime.   Yes [provider]  PROAIR HFA 108 (90 Base) MCG/ACT inhaler Inhale 2 puffs into the lungs every 4 (four) hours as needed for wheezing or shortness of breath.  04/20/18  Yes [provider]  atorvastatin (LIPITOR) 10 MG tablet Take 10 mg by mouth daily.    [provider]  escitalopram (LEXAPRO) 10 MG tablet Take 1 tablet (10 mg total) by mouth daily. 08/25/18   Shelly Coss, MD  folic acid (FOLVITE) 1 MG tablet Take 1 tablet (1 mg total) by mouth daily. Patient not taking: Reported on 01/04/2020 08/26/18   Shelly Coss, MD  hydrocortisone-pramoxine [facility]) 2.5-1 % rectal cream PLACE RECTALLY 4 (FOUR) TIMES DAILY. FOR IRRITATED AND PAINFUL HEMORRHOIDS Patient not taking: Reported on 01/04/2020    Michael Boston, MD  magnesium oxide (MAG-OX) 400 (241.3 Mg) MG tablet Take 1 tablet (400 mg total) by mouth 2 (two) times daily. Patient not taking:  Reported on 01/04/2020 08/25/18   Shelly Coss, MD  potassium chloride 20 MEQ/15ML (10%) SOLN Take 15 mLs (20 mEq total) by mouth daily for 14 doses. Patient not taking: Reported on 01/04/2020 08/26/18 09/09/18  Shelly Coss, MD  thiamine 100 MG tablet Take 1 tablet (100 mg total) by mouth daily. 08/26/18   Shelly Coss, MD  traMADol (ULTRAM) 50 MG tablet Take 1 tablet (50 mg total) by mouth every 6 (six) hours as  needed for moderate pain. Patient not taking: Reported on 01/04/2020 08/25/18   Shelly Coss, MD    Scheduled Meds: . busPIRone  10 mg Oral TID  . escitalopram  10 mg Oral Daily  . folic acid  1 mg Oral Daily  . guaiFENesin  600 mg Oral BID  . heparin  5,000 Units Subcutaneous Q8H  . ipratropium-albuterol  3 mL Nebulization Q6H  . lidocaine      . mouth rinse  15 mL Mouth Rinse BID  . methylPREDNISolone (SOLU-MEDROL) injection  60 mg Intravenous Q8H  . multivitamin with minerals  1 tablet Oral Daily  . thiamine  100 mg Oral Daily   Or  . thiamine  100 mg Intravenous Daily   Continuous Infusions: PRN Meds:.acetaminophen **OR** acetaminophen, albuterol, LORazepam **OR** LORazepam, ondansetron (ZOFRAN) IV, senna-docusate, traMADol  Allergies as of 01/04/2020 - Review Complete 01/04/2020  Allergen Reaction Noted  . Codeine Other (See Comments) 01/04/2020  . Tape Other (See Comments) 08/12/2018    Family History  Problem Relation Age of Onset  . Cancer Mother        bladder  . Heart disease Father   . Diabetes Father     Social History   Socioeconomic History  . Marital status: Widowed    Spouse name: Not on file  . Number of children: Not on file  . Years of education: Not on file  . Highest education level: Not on file  Occupational History  . Not on file  Tobacco Use  . Smoking status: Current Some Day Smoker    Packs/day: 3.00    Years: 25.00    Pack years: 75.00    Types: Cigarettes  . Smokeless tobacco: Never Used  Substance and Sexual Activity  . Alcohol use: Yes    Alcohol/week: 7.0 standard drinks    Types: 7 Glasses of wine per week    Comment: 1 glass winde daily  . Drug use: No  . Sexual activity: Not on file  Other Topics Concern  . Not on file  Social History Narrative  . Not on file   Social Determinants of Health   Financial Resource Strain:   . Difficulty of Paying Living Expenses:   Food Insecurity:   . Worried About Sales executive in the Last Year:   . Arboriculturist in the Last Year:   Transportation Needs:   . Film/video editor (Medical):   Marland Kitchen Lack of Transportation (Non-Medical):   Physical Activity:   . Days of Exercise per Week:   . Minutes of Exercise per Session:   Stress:   . Feeling of Stress :   Social Connections:   . Frequency of Communication with Friends and Family:   . Frequency of Social Gatherings with Friends and Family:   . Attends Religious Services:   . Active Member of Clubs or Organizations:   . Attends Archivist Meetings:   Marland Kitchen Marital Status:   Intimate Partner Violence:   . Fear of Current  or Ex-Partner:   . Emotionally Abused:   Marland Kitchen Physically Abused:   . Sexually Abused:     Review of Systems: Not able to obtain  Physical Exam: Vital signs: Vitals:   01/05/20 1240 01/05/20 1439  BP: 101/61   Pulse:    Resp:    Temp:    SpO2:  100%   Last BM Date: 01/03/20 (per patient) General:   Alert,  Well-developed, well-nourished, pleasant and cooperative in NAD HEENT : Normocephalic, atraumatic, extraocular movement intact. Lungs:  Clear throughout to auscultation.  No respiratory distress.  Anterior exam only Heart:  Regular rate and rhythm; no murmurs, clicks, rubs,  or gallops. Abdomen: Soft, mild distended, bowel sounds present.  No peritoneal signs LE: No edema.  Erythematous changes and  rash noted. Neuro.  Not able to assess as she recently received Ativan. Psych.  Not able to assess  GI:  Lab Results: Recent Labs    01/04/20 1220 01/05/20 0524  WBC 16.6* 10.4  HGB 10.1* 9.1*  HCT 31.0* 28.4*  PLT 200 184   BMET Recent Labs    01/04/20 1220 01/05/20 0524  NA 136 136  K 3.3* 4.2  CL 96* 99  CO2 28 26  GLUCOSE 143* 141*  BUN 26* 26*  CREATININE 1.00 0.78  CALCIUM 8.4* 8.0*   LFT Recent Labs    01/04/20 1220 01/04/20 1220 01/05/20 0524  PROT 5.7*   < > 5.3*  ALBUMIN 2.3*   < > 2.2*  AST 135*   < > 110*  ALT 50*   < > 44   ALKPHOS 152*   < > 133*  BILITOT 3.9*   < > 3.4*  BILIDIR 1.8*  --   --   IBILI 2.1*  --   --    < > = values in this interval not displayed.   PT/INR Recent Labs    01/04/20 1343 01/05/20 0524  LABPROT 15.6* 16.5*  INR 1.3* 1.4*     Studies/Results: DG Chest 2 View  Result Date: 01/04/2020 CLINICAL DATA:  Weakness. EXAM: CHEST - 2 VIEW COMPARISON:  August 26, 2018 FINDINGS: There is elevation of the right hemidiaphragm with subsequent stable right-sided volume loss. Mild, stable linear scarring is seen within the right upper lobe. There is no evidence of acute infiltrate. A very small, stable right pleural effusion is seen. No pneumothorax is identified. The heart size and mediastinal contours are within normal limits. Marked severity scoliosis of the thoracic spine is noted. IMPRESSION: 1. Stable right-sided volume loss with stable right upper lobe linear scarring. 2. Stable very small right pleural effusion. Electronically Signed   By: Virgina Norfolk M.D.   On: 01/04/2020 15:05   US Paracentesis  Result Date: 01/05/2020 INDICATION: History of alcoholic cirrhosis. Ascites. Request for diagnostic and therapeutic paracentesis. EXAM: ULTRASOUND GUIDED RIGHT LOWER QUADRANT PARACENTESIS MEDICATIONS: None. COMPLICATIONS: None immediate. PROCEDURE: Informed written consent was obtained from the patient after a discussion of the risks, benefits and alternatives to treatment. A timeout was performed prior to the initiation of the procedure. Initial ultrasound scanning demonstrates a moderate amount of ascites within the right lower abdominal quadrant. The right lower abdomen was prepped and draped in the usual sterile fashion. 1% lidocaine was used for local anesthesia. Following this, a 19 gauge, 7-cm, Yueh catheter was introduced. An ultrasound image was saved for documentation purposes. The paracentesis was performed. The catheter was removed and a dressing was applied. The patient  tolerated the procedure well without  immediate post procedural complication. FINDINGS: A total of approximately 2.4 L of clear yellow fluid was removed. Samples were sent to the laboratory as requested by the clinical team. IMPRESSION: Successful ultrasound-guided paracentesis yielding 2.4 liters of peritoneal fluid. Read by: Ascencion Dike PA-C Electronically Signed   By: Sandi Mariscal M.D.   On: 01/05/2020 12:52   US Abdomen Limited RUQ  Result Date: 01/04/2020 CLINICAL DATA:  Elevated liver function tests. EXAM: ULTRASOUND ABDOMEN LIMITED RIGHT UPPER QUADRANT COMPARISON:  August 13, 2018. FINDINGS: Gallbladder: No cholelithiasis is noted. No sonographic Murphy's sign is noted. Mild gallbladder wall thickening is noted most likely due to surrounding ascites. Common bile duct: Diameter: 3 mm which is within normal limits. Liver: 9 mm left hepatic cyst is noted which was present on prior CT scan. Increased echogenicity of hepatic parenchyma is noted suggesting diffuse hepatocellular disease. Bidirectional flow is noted in the portal vein suggesting portal hypertension. Other: Moderate ascites is noted. IMPRESSION: Increased echogenicity of hepatic parenchyma is noted suggesting diffuse hepatocellular disease or possibly cirrhosis. Bidirectional flow is noted in the portal vein suggesting portal hypertension. Moderate ascites is noted. No definite cholelithiasis is noted. Electronically Signed   By: Marijo Conception M.D.   On: 01/04/2020 16:27    Impression/Plan: -Probable cirrhosis of the liver with ascites.  Most likely from alcohol use. MELD 15.  Discriminant function score of 9.4. -Abnormal LFTs.  Patient with AST elevated more than ALT.  Most likely from alcohol use/cirrhosis. -Possible alcohol withdrawal.  -COPD exacerbation.  On Solu-Medrol.  Recommendations ----------------------- -Check hepatitis panel -Start low-dose diuretics. -Monitor LFTs and kidney functions. -Follow fluid  analysis. -Change diet to 2 g sodium. -GI will follow   LOS: 1 day   Otis Brace  MD, FACP 01/05/2020, 3:07 PM  Contact #  [phone]

## 2020-01-06 DIAGNOSIS — K7031 Alcoholic cirrhosis of liver with ascites: Secondary | ICD-10-CM

## 2020-01-06 DIAGNOSIS — F10231 Alcohol dependence with withdrawal delirium: Secondary | ICD-10-CM

## 2020-01-06 LAB — COMPREHENSIVE METABOLIC PANEL
ALT: 48 U/L — ABNORMAL HIGH (ref 0–44)
AST: 99 U/L — ABNORMAL HIGH (ref 15–41)
Albumin: 2.3 g/dL — ABNORMAL LOW (ref 3.5–5.0)
Alkaline Phosphatase: 131 U/L — ABNORMAL HIGH (ref 38–126)
Anion gap: 7 (ref 5–15)
BUN: 30 mg/dL — ABNORMAL HIGH (ref 8–23)
CO2: 29 mmol/L (ref 22–32)
Calcium: 8.3 mg/dL — ABNORMAL LOW (ref 8.9–10.3)
Chloride: 98 mmol/L (ref 98–111)
Creatinine, Ser: 0.98 mg/dL (ref 0.44–1.00)
GFR calc Af Amer: >60 mL/min (ref >60)
GFR calc non Af Amer: 58 mL/min — ABNORMAL LOW (ref >60)
Glucose, Bld: 136 mg/dL — ABNORMAL HIGH (ref 70–99)
Potassium: 4.5 mmol/L (ref 3.5–5.1)
Sodium: 134 mmol/L — ABNORMAL LOW (ref 135–145)
Total Bilirubin: 2.8 mg/dL — ABNORMAL HIGH (ref 0.3–1.2)
Total Protein: 5.4 g/dL — ABNORMAL LOW (ref 6.5–8.1)

## 2020-01-06 LAB — HEPATITIS C ANTIBODY: HCV Ab: NONREACTIVE

## 2020-01-06 LAB — PROTIME-INR
INR: 1.4 — ABNORMAL HIGH (ref 0.8–1.2)
Prothrombin Time: 16.2 seconds — ABNORMAL HIGH (ref 11.4–15.2)

## 2020-01-06 LAB — RETICULOCYTES
Immature Retic Fract: 5.7 % (ref 2.3–15.9)
RBC.: 2.53 MIL/uL — ABNORMAL LOW (ref 3.87–5.11)
Retic Count, Absolute: 76.9 10*3/uL (ref 19.0–186.0)
Retic Ct Pct: 3 % (ref 0.4–3.1)

## 2020-01-06 LAB — IRON AND TIBC
Iron: 55 ug/dL (ref 28–170)
TIBC: <70 ug/dL — ABNORMAL LOW (ref 250–450)

## 2020-01-06 LAB — FERRITIN: Ferritin: 817 ng/mL — ABNORMAL HIGH (ref 11–307)

## 2020-01-06 LAB — URINE CULTURE: Culture: 30000 — AB

## 2020-01-06 LAB — VITAMIN B12: Vitamin B-12: 1398 pg/mL — ABNORMAL HIGH (ref 180–914)

## 2020-01-06 LAB — HEPATITIS B SURFACE ANTIGEN: Hepatitis B Surface Ag: NONREACTIVE

## 2020-01-06 LAB — FOLATE: Folate: 4.6 ng/mL — ABNORMAL LOW (ref >5.9)

## 2020-01-06 MED ORDER — SPIRONOLACTONE 12.5 MG HALF TABLET
12.5000 mg | ORAL_TABLET | Freq: Every day | ORAL | Status: DC
  Administered 2020-01-08 – 2020-01-10 (×3): 12.5 mg via ORAL
  Filled 2020-01-06 (×8): qty 1

## 2020-01-06 MED ORDER — METHYLPREDNISOLONE SODIUM SUCC 125 MG IJ SOLR
60.0000 mg | INTRAMUSCULAR | Status: DC
  Administered 2020-01-07 – 2020-01-08 (×2): 60 mg via INTRAVENOUS
  Filled 2020-01-06 (×2): qty 2

## 2020-01-06 NOTE — Progress Notes (Signed)
[facility] Gastroenterology Progress Note  [name] 69 y.o. 1951-01-29  CC: Cirrhosis of the liver   Subjective: Patient seen and examined at bedside.  Denies any acute GI issues.  Able to answer some questions but appears somewhat confused  ROS : Afebrile.  Denies any chest pain.   Objective: Vital signs in last 24 hours: Vitals:   01/06/20 1331 01/06/20 1502  BP: (!) 91/55 (!) 109/92  Pulse: 96 87  Resp: 20 20  Temp: 98 F (36.7 C) 97.6 F (36.4 C)  SpO2: 98% 97%    Physical Exam:  General.  Elderly patient.  Not in acute distress somewhat confused Abdomen.  Soft, nontender, nondistended, bowel sounds present. Skin.  Warm. Lower extremity.  No edema   Lab Results: Recent Labs    01/04/20 1220 01/04/20 1605 01/05/20 0524 01/06/20 0614  NA   < >  --  136 134*  K   < >  --  4.2 4.5  CL   < >  --  99 98  CO2   < >  --  26 29  GLUCOSE   < >  --  141* 136*  BUN   < >  --  26* 30*  CREATININE   < >  --  0.78 0.98  CALCIUM   < >  --  8.0* 8.3*  MG  --  1.8 2.3  --   PHOS  --  3.8  --   --    < > = values in this interval not displayed.   Recent Labs    01/05/20 0524 01/06/20 0614  AST 110* 99*  ALT 44 48*  ALKPHOS 133* 131*  BILITOT 3.4* 2.8*  PROT 5.3* 5.4*  ALBUMIN 2.2* 2.3*   Recent Labs    01/04/20 1220 01/05/20 0524  WBC 16.6* 10.4  HGB 10.1* 9.1*  HCT 31.0* 28.4*  MCV 110.7* 113.1*  PLT 200 184   Recent Labs    01/05/20 0524 01/06/20 0614  LABPROT 16.5* 16.2*  INR 1.4* 1.4*      Assessment/Plan: -Probable cirrhosis of the liver with ascites.  Most likely from alcohol use. MELD 15.  Discriminant function score of 9.4 as of January 05, 2020.  Paracentesis June 26 with removal of 2.4 L of fluid.  Negative for SBP -Abnormal LFTs.  Patient with AST elevated more than ALT.  Most likely from alcohol use/cirrhosis. -Possible alcohol withdrawal.  -COPD exacerbation.  On Solu-Medrol.  Recommendations ----------------------- - hepatitis  panel negative -Continue low-dose diuretics. -Monitor LFTs and kidney functions. -  2 g sodium. -Recommend further work-up of cirrhosis as an outpatient. -GI will sign off.  Call us back if needed.  Follow-up in GI clinic in 4 weeks after discharge   Otis Brace MD, [place] 01/06/2020, 3:18 PM  Contact #  [phone]

## 2020-01-06 NOTE — Progress Notes (Signed)
PROGRESS NOTE  [name] QMG:[id number] DOB: [birth year] DOA: 01/04/2020 PCP: Wenda Low, MD  Brief History   69 year old woman PMH alcoholism with daily use, presented with increasing fatigue, nausea, vomiting and abdominal pain.  Drinks 2-3 bottles of wine daily.  Admitted for alcoholic hepatitis with moderate ascites  A & P  Alcoholic hepatitis with moderate ascites, elevated LFTs, elevated INR, probable cirrhosis. Maddrey's Discriminant Function 9.4 --Right upper quadrant ultrasound suggested diffuse hepatocellular disease or possible cirrhosis, portal hypertension.  --Status post ultrasound guided paracentesis 2.4 L clear yellow fluid 6/26, no evidence of SBP.  Abdominal exam is benign.  No fever, signs or symptoms to suggest infection. --LFTs without significant change other than total bilirubin which has decreased.  Continue low-dose diuretics per gastroenterology as blood pressure can tolerate.  Further recommendations per gastroenterology.  Alcohol use disorder, alcoholism with daily drinking 2-3 bottles of wine per day, now with acute withdrawal --More confused today.  Appears to be withdrawing from alcohol.  Tremors noted. Continue CIWA.  Macrocytic anemia, stable --Folate slightly low.  Continue replacement.  B12 adequate --Continue multivitamin --CBC in a.m.  Borderline hypotension likely secondary to hypoalbuminemia --Remains stable.  COPD exacerbation complicated by ongoing smoking.  PMH right lung cancer. --Fair air movement, less wheezing.  Decrease steroids, continue bronchodilators.  Essential tremor  Tobacco use disorder, smokes 2 packs cigarettes per day --Continue nicotine patch  Possible left lateral tongue cancer 2019 --will examine when patient able to participate  12 mm nodule in the left lower lung is similar to previous PET-CT scan. (from CT 08/2018). See PET-CT report, suggesting possible low-grade malignancy.  --CXR left-hemithorax  unremarkable, follow-up as an outpatient.  Disposition Plan:   Discussion: discussed with step-daughter. Pt lives alone, drinks 3-4 bottles of wine per day. Step-daughter hired someone to clean house and get groceries. Pt non-compliant with meds, doctor's appointments. Was told maybe had tongue cancer and lung nodule, but did not follow-up.  Prognosis guarded.  Chronically ill, continue to follow alcoholic hepatitis, now with alcohol withdrawal.  COPD improving.  Anticipate discharge once alcohol withdrawal resolved.  Status is: Inpatient  Remains inpatient appropriate because:IV treatments appropriate due to intensity of illness or inability to take PO   Dispo: The patient is from: Home              Anticipated d/c is to: Home              Anticipated d/c date is: 2 days              Patient currently is not medically stable to d/c.  DVT prophylaxis:  heparin injection 5,000 Units Start: 01/04/20 1730 Code Status: DNR Family Communication: discussed with step-daughter Angela by telephone, reports no other family involved; pt went through withdrawal last hospitalization as well.  Murray Hodgkins, MD  [facility] Direct contact: see [email or web address] (further directions at bottom of note if needed) 7PM-7AM contact night coverage as at bottom of note 01/06/2020, 2:05 PM  LOS: 2 days   Significant Hospital Events   . 6/25 admitted for alcoholic hepatitis with ascites   Consults:  . Gastroenterology   Procedures:  . 6/26 ultrasound-guided paracentesis 2.4 L clear yellow fluid  Significant Diagnostic Tests:  Marland Kitchen    Micro Data:  . SARS-CoV-2 negative   Antimicrobials:  .   Interval History/Subjective  Continues today.  Answers not appropriate.  Objective   Vitals:  Vitals:   01/06/20 0936 01/06/20 1331  BP:  (!) 91/55  Pulse:  96  Resp:  20  Temp:  98 F (36.7 C)  SpO2: 96% 98%    Exam:  Constitutional.  Sleeping comfortably initially.  Awakens easily.  Tremors  of the hands noted. Psychiatric.  Odd mood and affect.  Confused.  Speech fluent, difficult to understand, not appropriate. Respiratory.  Fair air movement.  Expiratory wheeze noted.  Normal respiratory effort. Cardiovascular.  Regular rate and rhythm.  No murmur, rub or gallop.  No lower extremity edema.  Telemetry sinus rhythm. Abdomen.  Soft, nontender, nondistended. Skin.  Multiple areas of ecchymosis noted over arm.  I have personally reviewed the following:   Today's Data  . Hepatitis B and C negative  BMP unremarkable. Probable cirrhosis alkaline phosphatase, AST, ALT without significant change.  Total bilirubin slightly low at 2.8.  Scheduled Meds: . busPIRone  10 mg Oral TID  . escitalopram  10 mg Oral Daily  . folic acid  1 mg Oral Daily  . furosemide  20 mg Oral Daily  . guaiFENesin  600 mg Oral BID  . heparin  5,000 Units Subcutaneous Q8H  . ipratropium-albuterol  3 mL Nebulization Q6H  . mouth rinse  15 mL Mouth Rinse BID  . methylPREDNISolone (SOLU-MEDROL) injection  60 mg Intravenous Q8H  . multivitamin with minerals  1 tablet Oral Daily  . spironolactone  25 mg Oral Daily  . thiamine  100 mg Oral Daily   Or  . thiamine  100 mg Intravenous Daily   Continuous Infusions:  Principal Problem:   Alcoholic hepatitis with ascites Active Problems:   Cigarette smoker   Elevated LFTs   Hypokalemia   Macrocytic anemia   Hypotension   COPD exacerbation (HCC)   Alcoholism (HCC)   Tobacco use disorder   Pressure injury of skin   LOS: 2 days   How to contact the [facility] Attending or Consulting provider [place] or covering provider during after hours [place], for this patient?  1. Check the care team in [facility] and look for a) attending/consulting [facility] provider listed and b) the [facility] team listed 2. Log into [email or web address] and use [place]'s universal password to access. If you do not have the password, please contact the hospital operator. 3. Locate the [facility] provider you are  looking for under [facility] and page to a number that you can be directly reached. 4. If you still have difficulty reaching the provider, please page the [facility] (Director on Call) for the Hospitalists listed on amion for assistance.

## 2020-01-07 LAB — COMPREHENSIVE METABOLIC PANEL
ALT: 70 U/L — ABNORMAL HIGH (ref 0–44)
AST: 196 U/L — ABNORMAL HIGH (ref 15–41)
Albumin: 2.2 g/dL — ABNORMAL LOW (ref 3.5–5.0)
Alkaline Phosphatase: 125 U/L (ref 38–126)
Anion gap: 4 — ABNORMAL LOW (ref 5–15)
BUN: 37 mg/dL — ABNORMAL HIGH (ref 8–23)
CO2: 30 mmol/L (ref 22–32)
Calcium: 8.3 mg/dL — ABNORMAL LOW (ref 8.9–10.3)
Chloride: 100 mmol/L (ref 98–111)
Creatinine, Ser: 1.05 mg/dL — ABNORMAL HIGH (ref 0.44–1.00)
GFR calc Af Amer: >60 mL/min (ref >60)
GFR calc non Af Amer: 54 mL/min — ABNORMAL LOW (ref >60)
Glucose, Bld: 95 mg/dL (ref 70–99)
Potassium: 4.4 mmol/L (ref 3.5–5.1)
Sodium: 134 mmol/L — ABNORMAL LOW (ref 135–145)
Total Bilirubin: 2.6 mg/dL — ABNORMAL HIGH (ref 0.3–1.2)
Total Protein: 5.2 g/dL — ABNORMAL LOW (ref 6.5–8.1)

## 2020-01-07 LAB — PROTIME-INR
INR: 1.4 — ABNORMAL HIGH (ref 0.8–1.2)
Prothrombin Time: 16.5 seconds — ABNORMAL HIGH (ref 11.4–15.2)

## 2020-01-07 LAB — CBC
HCT: 28.8 % — ABNORMAL LOW (ref 36.0–46.0)
Hemoglobin: 9.4 g/dL — ABNORMAL LOW (ref 12.0–15.0)
MCH: 35.7 pg — ABNORMAL HIGH (ref 26.0–34.0)
MCHC: 32.6 g/dL (ref 30.0–36.0)
MCV: 109.5 fL — ABNORMAL HIGH (ref 80.0–100.0)
Platelets: 192 10*3/uL (ref 150–400)
RBC: 2.63 MIL/uL — ABNORMAL LOW (ref 3.87–5.11)
RDW: 12.9 % (ref 11.5–15.5)
WBC: 16.5 10*3/uL — ABNORMAL HIGH (ref 4.0–10.5)
nRBC: 0 % (ref 0.0–0.2)

## 2020-01-07 MED ORDER — PANTOPRAZOLE SODIUM 40 MG PO TBEC
40.0000 mg | DELAYED_RELEASE_TABLET | Freq: Every day | ORAL | Status: DC
  Administered 2020-01-07 – 2020-01-10 (×4): 40 mg via ORAL
  Filled 2020-01-07 (×5): qty 1

## 2020-01-07 MED ORDER — IPRATROPIUM-ALBUTEROL 0.5-2.5 (3) MG/3ML IN SOLN
3.0000 mL | Freq: Three times a day (TID) | RESPIRATORY_TRACT | Status: DC
  Administered 2020-01-07 – 2020-01-08 (×5): 3 mL via RESPIRATORY_TRACT
  Filled 2020-01-07 (×4): qty 3

## 2020-01-07 NOTE — Evaluation (Signed)
Physical Therapy Evaluation Patient Details Name: [name] MRN: [id number] DOB: 22-Feb-1951 Today's Date: 01/07/2020   History of Present Illness  69 year old woman PMH alcoholism with daily use, COPD, lung CA. presented with increasing fatigue, nausea, vomiting and abdominal pain.  Drinks 2-3 bottles of wine daily.  Admitted for alcoholic hepatitis with moderate ascites, possible tongue CA  Clinical Impression  Pt admitted with above diagnosis.   Pt is very confused, easily distracted and without insight into her own deficits. She asks when she can go home even though she cannot stand  from EOB and refuses assist, states she is "just too tired".   Pt  Had her O2 off on PT arrival and her Sats were in the 15s. O2 replaced and incr to 88-89% at rest.   Pt will most likely need SNF unless she has 24hour assist; if pt has capacity to make her own decisions, will likely refuse SNF and need HHPT.  Pt currently with functional limitations due to the deficits listed below (see PT Problem List). Pt will benefit from skilled PT to increase their independence and safety with mobility to allow discharge to the venue listed below.       Follow Up Recommendations SNF    Equipment Recommendations  None recommended by PT    Recommendations for Other Services       Precautions / Restrictions Precautions Precautions: Fall Restrictions Weight Bearing Restrictions: No      Mobility  Bed Mobility Overal bed mobility: Needs Assistance Bed Mobility: Supine to Sit;Sit to Supine     Supine to sit: Min guard Sit to supine: Min guard   General bed mobility comments: incr time and effort  Transfers Overall transfer level: Needs assistance Equipment used: Rolling walker (2 wheeled) Transfers: Sit to/from Stand           General transfer comment: multiple attempts to stand, pt unable and refused assist bc she does not want to be touched  Ambulation/Gait                Stairs             Wheelchair Mobility    Modified Rankin (Stroke Patients Only)       Balance Overall balance assessment: Needs assistance Sitting-balance support: No upper extremity supported;Feet supported Sitting balance-Leahy Scale: Fair       Standing balance-Leahy Scale: Zero                               Pertinent Vitals/Pain Pain Assessment: No/denies pain    Home Living Family/patient expects to be discharged to:: Unsure     Type of Home: Apartment Home Access: Level entry     Home Layout: One level Home Equipment: None Additional Comments: pt is unable to give above info d/t decr cognition; (info taken from previous admission, pt states she has local family)    Prior Function           Comments: reports independence     Hand Dominance        Extremity/Trunk Assessment   Upper Extremity Assessment Upper Extremity Assessment: Generalized weakness    Lower Extremity Assessment Lower Extremity Assessment: Generalized weakness       Communication   Communication: No difficulties  Cognition Arousal/Alertness: Awake/alert Behavior During Therapy: Restless Overall Cognitive Status: Impaired/Different from baseline Area of Impairment: Orientation;Attention;Following commands;Safety/judgement;Problem solving  Orientation Level: Disoriented to;Place;Time;Situation Current Attention Level: Focused   Following Commands: Follows one step commands inconsistently Safety/Judgement: Decreased awareness of safety;Decreased awareness of deficits   Problem Solving: Decreased initiation;Slow processing;Difficulty sequencing;Requires verbal cues;Requires tactile cues General Comments: pt looking for her glasses which she was wearing. pt with diminished insight into her current deficits, asking to go home even though she was unable to stand      General Comments      Exercises     Assessment/Plan    PT Assessment  Patient needs continued PT services  PT Problem List Decreased mobility;Decreased strength;Decreased range of motion;Decreased activity tolerance;Decreased balance;Pain;Decreased knowledge of use of DME;Decreased cognition;Decreased coordination       PT Treatment Interventions DME instruction;Therapeutic exercise;Gait training;Functional mobility training;Therapeutic activities;Patient/family education;Balance training    PT Goals (Current goals can be found in the Care Plan section)  Acute Rehab PT Goals Patient Stated Goal: to go home PT Goal Formulation: With patient Time For Goal Achievement: 01/21/20 Potential to Achieve Goals: Fair    Frequency Min 2X/week   Barriers to discharge        Co-evaluation               AM-PAC PT "6 Clicks" Mobility  Outcome Measure Help needed turning from your back to your side while in a flat bed without using bedrails?: None Help needed moving from lying on your back to sitting on the side of a flat bed without using bedrails?: None Help needed moving to and from a bed to a chair (including a wheelchair)?: A Little Help needed standing up from a chair using your arms (e.g., wheelchair or bedside chair)?: A Lot Help needed to walk in hospital room?: A Lot Help needed climbing 3-5 steps with a railing? : Total 6 Click Score: 16    End of Session   Activity Tolerance: Patient limited by fatigue;Other (comment) (limited by cognition) Patient left: in bed;with call bell/phone within reach;with bed alarm set Nurse Communication: Mobility status PT Visit Diagnosis: Unsteadiness on feet (R26.81);Other abnormalities of gait and mobility (R26.89);Muscle weakness (generalized) (M62.81)    Time: 2863-8177 PT Time Calculation (min) (ACUTE ONLY): 20 min   Charges:   PT Evaluation $PT Eval Low Complexity: [place], PT  Acute Rehab Dept ([place]) [phone] Pager [phone]  01/07/2020   [facility] 01/07/2020, 5:17  PM

## 2020-01-07 NOTE — Care Management Important Message (Signed)
Important Message  Patient Details IM Letter given to Nancy Marus RN Case Manager to present to the Patient Name: [name] MRN: [id number] Date of Birth: 04-May-1951   Medicare Important Message Given:  Yes     Kerin Salen 01/07/2020, 11:50 AM

## 2020-01-07 NOTE — Progress Notes (Addendum)
PROGRESS NOTE  [name] POE:[id number] DOB: 01-Feb-1951 DOA: 01/04/2020 PCP: Wenda Low, MD  Brief History   69 year old woman PMH alcoholism with daily use, presented with increasing fatigue, nausea, vomiting and abdominal pain.  Drinks 2-3 bottles of wine daily.  Admitted for alcoholic hepatitis with moderate ascites  A & P  Alcoholic hepatitis with moderate ascites, elevated LFTs, elevated INR, probable cirrhosis. Maddrey's Discriminant Function 9.4 --Right upper quadrant ultrasound suggested diffuse hepatocellular disease or possible cirrhosis, portal hypertension.  --Status post ultrasound guided paracentesis 2.4 L clear yellow fluid 6/26, no evidence of SBP.  --LFTs noted, transaminases higher but alkaline phosphatase and total bilirubin lower.  --Follow-up with gastroenterology in 4 weeks to further evaluate cirrhosis.  Continue low-dose diuretics per gastroenterology as blood pressure can tolerate.  Alcohol use disorder, alcoholism with daily drinking 2-3 bottles of wine per day, with acute withdrawal --Appears better today, withdrawal seems to be resolving, continue CIWA  Macrocytic anemia, stable --Anemia stable.  No further evaluation suggested. --Folate slightly low.  Continue replacement.  B12 adequate --Continue multivitamin  Borderline hypotension likely secondary to hypoalbuminemia --Stable.  COPD exacerbation complicated by ongoing smoking.  PMH right lung cancer. --Improved air movement and less wheezing but still has increased work of breathing.  Continue steroids, continue bronchodilators.  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  Suggest outpatient follow-up.  12 mm nodule in the left lower lung is similar to previous PET-CT scan. (from CT 08/2018). See PET-CT report, suggesting possible low-grade malignancy.  --CXR  left-hemithorax unremarkable, follow-up as an outpatient.  Peripheral vascular disease --follow-up as an outpatient  Disposition Plan:   Discussion: Clinically improving, hepatitis stabilizing, COPD slowly improving.  Plan therapy evaluation.  Patient lives alone and is currently confused, will reassess for safe discharge in a.m.  It is not clear to me today that she has capacity.  Prognosis is guarded, patient lives alone and is an alcoholic.  Chronically ill, noncompliant, with possible tongue and lung cancer that needs outpatient follow-up.  Status is: Inpatient  Remains inpatient appropriate because:Altered mental status and IV treatments appropriate due to intensity of illness or inability to take PO  Dispo: The patient is from: Home              Anticipated d/c is to: Home              Anticipated d/c date is: 2 days              Patient currently is not medically stable to d/c.  DVT prophylaxis:  heparin injection 5,000 Units Start: 01/04/20 1730 Code Status: DNR Family Communication: discussed with step-daughter by telephone  Murray Hodgkins, MD  [facility] Direct contact: see [email or web address] (further directions at bottom of note if needed) 7PM-7AM contact night coverage as at bottom of note 01/07/2020, 4:15 PM  LOS: 3 days   Significant Hospital Events   . 6/25 admitted for alcoholic hepatitis with ascites   Consults:  . Gastroenterology   Procedures:  . 6/26 ultrasound-guided paracentesis 2.4 L clear yellow fluid  Significant Diagnostic Tests:  Marland Kitchen    Micro Data:  . SARS-CoV-2 negative   Antimicrobials:  .   Interval History/Subjective  Seems to feel okay today.  Confused.  Objective   Vitals:  Vitals:   01/07/20 1354 01/07/20 1446  BP: 104/66   Pulse: (!) 103   Resp: 17  Temp: 98.3 F (36.8 C)   SpO2: 93% 95%    Exam:  Constitutional.  Appears more comfortable today.  Still somewhat confused, speech more appropriate today. Psychiatric.   Confused.  Disoriented to location month and year.  Oriented to self. Cardiovascular.  Regular rate and rhythm.  No murmur, rub or gallop.  No lower extremity edema.  Telemetry sinus rhythm. Respiratory.  Clear to auscultation bilaterally.  No wheezes, rales or rhonchi.  Prolonged expiration.  Mild increased respiratory effort. Abdomen.  Soft, nontender, nondistended. Mouth.  There is a long linear lesion right side of the tongue, abnormal color.  I have personally reviewed the following:   Today's Data   Basic metabolic panel unremarkable.  AST and ALT slightly higher today.  Alkaline phosphatase lower.  Total bilirubin slightly lower at 2.6.  WBC higher at 16.5 presumably secondary to steroids.  Hemoglobin stable at 9.4.  Scheduled Meds: . busPIRone  10 mg Oral TID  . escitalopram  10 mg Oral Daily  . folic acid  1 mg Oral Daily  . furosemide  20 mg Oral Daily  . guaiFENesin  600 mg Oral BID  . heparin  5,000 Units Subcutaneous Q8H  . ipratropium-albuterol  3 mL Nebulization TID  . mouth rinse  15 mL Mouth Rinse BID  . methylPREDNISolone (SOLU-MEDROL) injection  60 mg Intravenous Q24H  . multivitamin with minerals  1 tablet Oral Daily  . spironolactone  12.5 mg Oral Daily  . thiamine  100 mg Oral Daily   Or  . thiamine  100 mg Intravenous Daily   Continuous Infusions:  Principal Problem:   Alcoholic hepatitis with ascites Active Problems:   Cigarette smoker   Elevated LFTs   Hypokalemia   Macrocytic anemia   Hypotension   COPD exacerbation (HCC)   Alcoholism (HCC)   Tobacco use disorder   Pressure injury of skin   LOS: 3 days   How to contact the [facility] Attending or Consulting provider [place] or covering provider during after hours [place], for this patient?  1. Check the care team in [facility] and look for a) attending/consulting TRH provider listed and b) the [facility] team listed 2. Log into [email or web address] and use [place]'s universal password to access. If you do not have  the password, please contact the hospital operator. 3. Locate the [facility] provider you are looking for under [facility] and page to a number that you can be directly reached. 4. If you still have difficulty reaching the provider, please page the [facility] (Director on Call) for the Hospitalists listed on amion for assistance.

## 2020-01-07 NOTE — Evaluation (Signed)
Clinical/Bedside Swallow Evaluation Patient Details  Name: [name] MRN: [id number] Date of Birth: 1950/09/30  Today's Date: 01/07/2020 Time: SLP Start Time (ACUTE ONLY): 1555 SLP Stop Time (ACUTE ONLY): 1625 SLP Time Calculation (min) (ACUTE ONLY): 30 min  Past Medical History:  Past Medical History:  Diagnosis Date  . Anemia   . Cancer [facility]) 2007   lung cancer, right, chemo done  . Complication of anesthesia 2007   slow to awaken  . History of blood transfusion 2010  . Hypertension   . Occasional tremors    Past Surgical History:  Past Surgical History:  Procedure Laterality Date  . COLONOSCOPY WITH PROPOFOL N/A 11/28/2012   Procedure: COLONOSCOPY WITH PROPOFOL;  Surgeon: Garlan Fair, MD;  Location: [facility];  Service: Endoscopy;  Laterality: N/A;  . REVERSE SHOULDER ARTHROPLASTY Left 08/14/2018   Procedure: REVERSE SHOULDER ARTHROPLASTY;  Surgeon: Netta Cedars, MD;  Location: [place];  Service: Orthopedics;  Laterality: Left;  . right lung lobectomy  2007   HPI:    Pt admit with confusion - has COPD exacerbation and found to have alcohol hepatitis and ascites.   Pt PMH for lung cancer s/p chemotherapy and lung lobectomy.  Concern for lingual cancer noted from 2019 c/b lingual ulcerations with recommendation for hemiglossectomy and radiation treatment.  Pt did not pursue follow up and states "I don't want a needle in my tongue."    Assessment / Plan / Recommendation Clinical Impression  Pt without focal CN deficits impacting swallow ability.  She was observed to consume water via straw x3 boluses and 3/4 of container of jello - self feeding. Although pt frequently spilled jello off the spoon, she was able to consume most of it with SLP guarding to capture loss.   Swallow was clinically timely with laryngeal elevation appreciated via observation and no oral retention.   Limited intake observed due to pt participation.  No s/s of aspiration with intake nor indication of  severe dysphagia.  Extended breath-hold with water via straw noted - ? due to COPD - but protective exhalation observed post-swallow.  Recommend pt take medications with puree- whole as pt does report h/o coughing with intake but did not expand on information including duration of dysphagia, frequency, intensity, etc. Recommend clear liquid diet and medicine with puree due to current mentation.  Of note, pt takes Zantac at home prn per direct question cue.  She did not report lingual pain and did not appear with lesions/ulcerations on her tongue.  SLP will follow up to assure po tolerance and further diagnostic treatment. SLP Visit Diagnosis: Dysphagia, unspecified (R13.10)    Aspiration Risk  Mild aspiration risk    Diet Recommendation Thin liquid (clears)   Supervision: Staff to assist with self feeding (pt to help self feed) Compensations: Slow rate;Small sips/bites Postural Changes: Seated upright at 90 degrees;Remain upright for at least 30 minutes after po intake    Other  Recommendations Oral Care Recommendations: Oral care QID   Follow up Recommendations  TBD     Frequency and Duration min 1 x/week  1 week       Prognosis Prognosis for Safe Diet Advancement: Fair Barriers to Reach Goals: Cognitive deficits;Behavior;Motivation      Swallow Study   General   Pt admit with confusion - has COPD exacerbation and found to have alcohol hepatitis and ascites.   Pt PMH for lung cancer s/p chemotherapy and lung lobectomy.  Concern for lingual cancer noted from 2019 - lingual  ulceration with recommendation for hemiglossectomy and radiation treatment.  Pt did not pursue follow up and states "I don't want a needle in my tongue."     Oral/Motor/Sensory Function Overall Oral Motor/Sensory Function: Generalized oral weakness   Ice Chips Ice chips: Not tested   Thin Liquid Thin Liquid: Within functional limits Presentation: Cup;Straw;Self Fed Other Comments: pt with extended breathhold with  swallow - no indication of aspiration, exhalation post-swallow; able to self feed liquids    Nectar Thick Nectar Thick Liquid: Not tested   Honey Thick Honey Thick Liquid: Not tested   Puree Puree: Within functional limits Presentation: Self Fed;Spoon Other Comments: jello   Solid     Solid: Not tested Other Comments: due to pt's mentation and mild dysarthria      Macario Golds 01/07/2020,4:57 PM  Kathleen Lime, MS Painter Office [phone]

## 2020-01-08 DIAGNOSIS — G9341 Metabolic encephalopathy: Secondary | ICD-10-CM

## 2020-01-08 LAB — PROTIME-INR
INR: 1.3 — ABNORMAL HIGH (ref 0.8–1.2)
Prothrombin Time: 16.1 seconds — ABNORMAL HIGH (ref 11.4–15.2)

## 2020-01-08 LAB — AMMONIA: Ammonia: 50 umol/L — ABNORMAL HIGH (ref 9–35)

## 2020-01-08 MED ORDER — PREDNISONE 20 MG PO TABS
40.0000 mg | ORAL_TABLET | Freq: Every day | ORAL | Status: DC
  Administered 2020-01-09 – 2020-01-10 (×2): 40 mg via ORAL
  Filled 2020-01-08 (×3): qty 2

## 2020-01-08 MED ORDER — LACTULOSE 10 GM/15ML PO SOLN
20.0000 g | Freq: Every day | ORAL | Status: DC
  Administered 2020-01-08: 20 g via ORAL
  Filled 2020-01-08: qty 30

## 2020-01-08 MED ORDER — LACTULOSE 10 GM/15ML PO SOLN
20.0000 g | Freq: Two times a day (BID) | ORAL | Status: DC
  Administered 2020-01-08 – 2020-01-09 (×3): 20 g via ORAL
  Filled 2020-01-08 (×7): qty 30

## 2020-01-08 NOTE — TOC Initial Note (Signed)
Transition of Care [facility]) - Initial/Assessment Note    Patient Details  Name: [name] MRN: [id number] Date of Birth: [birth year]  Transition of Care (TOC) CM/SW Contact:    Joaquin Courts, RN Phone Number: 01/08/2020, 12:39 PM  Clinical Narrative:    Patient remains disoriented and unable to participate in discharge planning.  CM spoke with patient's daughter regarding recommendation for SNF.  Daughter reports she would like this for her mother but in the past patient has adamantly declined SNF once she was oriented enough to make that decision.  Daughter reports patient lives alone and she feels it would be the best for the patient to get some rehab so she can get stronger.  Daughter would like CM to fax out patient to see which facilities make bed offers.  FL2 faxed out to area facilities.                  Expected Discharge Plan: Skilled Nursing Facility Barriers to Discharge: Continued Medical Work up   Patient Goals and CMS Choice Patient states their goals for this hospitalization and ongoing recovery are:: per the daughter, patient will benefit from SNF CMS Medicare.gov Compare Post Acute Care list provided to:: Patient Represenative (must comment) Choice offered to / list presented to : Adult Children  Expected Discharge Plan and Services Expected Discharge Plan: [place]   Discharge Planning Services: CM Consult Post Acute Care Choice: [place] Living arrangements for the past 2 months: Single Family Home                 DME Arranged: N/A DME Agency: NA       HH Arranged: NA HH Agency: NA        Prior Living Arrangements/Services Living arrangements for the past 2 months: Single Family Home Lives with:: Self Patient language and need for interpreter reviewed:: Yes Do you feel safe going back to the place where you live?: Yes      Need for Family Participation in Patient Care: Yes (Comment) Care giver support system in  place?: Yes (comment)   Criminal Activity/Legal Involvement Pertinent to Current Situation/Hospitalization: No - Comment as needed  Activities of Daily Living Home Assistive Devices/Equipment: Eyeglasses, Gilford Rile (specify type) ADL Screening (condition at time of admission) Patient's cognitive ability adequate to safely complete daily activities?: Yes Is the patient deaf or have difficulty hearing?: Yes (pt keeps yelling that she can't hear me) Does the patient have difficulty seeing, even when wearing glasses/contacts?: Yes (needs new eye prescription) Does the patient have difficulty concentrating, remembering, or making decisions?: No Patient able to express need for assistance with ADLs?: Yes Does the patient have difficulty dressing or bathing?: Yes Independently performs ADLs?: No Communication: Independent Dressing (OT): Needs assistance Is this a change from baseline?: Pre-admission baseline Grooming: Independent Feeding: Independent Bathing: Needs assistance Is this a change from baseline?: Pre-admission baseline Toileting: Needs assistance Is this a change from baseline?: Pre-admission baseline In/Out Bed: Needs assistance Is this a change from baseline?: Pre-admission baseline Walks in Home: Needs assistance Is this a change from baseline?: Pre-admission baseline Does the patient have difficulty walking or climbing stairs?: Yes Weakness of Legs: None Weakness of Arms/Hands: None  Permission Sought/Granted                  Emotional Assessment       Orientation: : Fluctuating Orientation (Suspected and/or reported [place])   Psych Involvement: No (comment)  Admission diagnosis:  Abdominal  distension [R14.0] Elevated LFTs [R79.89] COPD exacerbation (HCC) [J44.1] Pleural effusion on right [C37] 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 Alcoholism ([place]) 01/05/2020  . Tobacco use disorder 01/05/2020  . Pressure injury of skin 01/05/2020  . COPD exacerbation ([place]) 01/04/2020  . Alcoholic hepatitis with ascites 01/04/2020  . Malnutrition of moderate degree 08/14/2018  . 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 Alcohol dependence with withdrawal ([place]) 08/13/2018  . Elevated LFTs 08/13/2018  . Hypokalemia 08/13/2018  . Macrocytic anemia 08/13/2018  . Hypotension 08/13/2018  . Closed fracture of left humerus 08/13/2018  . Tachycardia   . Closed left humeral fracture 08/12/2018  . Solitary pulmonary nodule on lung CT 06/24/2018  . COPD pfts pending/ active smoker 06/24/2018  . Cigarette smoker 06/24/2018  . Tongue ulceration 06/14/2018  . Personal history of colonic polyps 01/29/2013  . Internal prolapsed hemorrhoids 01/29/2013   PCP:  Wenda Low, MD Pharmacy:   [facility] [place], [place] - [address] AT [facility] [address] [place] [address] Phone: [phone] Fax: [phone]     Social Determinants of Health (SDOH) Interventions    Readmission Risk Interventions Readmission Risk Prevention Plan 01/08/2020  Transportation Screening Complete  HRI or Home Care Consult Complete  Social Work Consult for [place] Planning/Counseling Complete  Palliative Care Screening Not Applicable  Medication Review Press photographer) Complete  Some recent data might be hidden

## 2020-01-08 NOTE — NC FL2 (Signed)
[place] LEVEL OF CARE SCREENING TOOL     IDENTIFICATION  Patient Name: [name] Birthdate: [birth year] Sex: female Admission Date (Current Location): 01/04/2020  [facility] and [place] Number:  Herbalist and Address:  [facility],  [address] [place], [place]      Provider Number: [id number]  Attending Physician Name and Address:  Samuella Cota, MD  Relative Name and Phone Number:       Current Level of Care: Hospital Recommended Level of Care: [place] Prior Approval Number:    Date Approved/Denied:   PASRR Number: [id number]  Discharge Plan: SNF    Current Diagnoses: Patient Active Problem List   Diagnosis Date Noted  . Alcoholism ([place]) 01/05/2020  . Tobacco use disorder 01/05/2020  . Pressure injury of skin 01/05/2020  . COPD exacerbation ([place]) 01/04/2020  . Alcoholic hepatitis with ascites 01/04/2020  . Malnutrition of moderate degree 08/14/2018  . 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 Alcohol dependence with withdrawal ([place]) 08/13/2018  . Elevated LFTs 08/13/2018  . Hypokalemia 08/13/2018  . Macrocytic anemia 08/13/2018  . Hypotension 08/13/2018  . Closed fracture of left humerus 08/13/2018  . Tachycardia   . Closed left humeral fracture 08/12/2018  . Solitary pulmonary nodule on lung CT 06/24/2018  . COPD pfts pending/ active smoker 06/24/2018  . Cigarette smoker 06/24/2018  . Tongue ulceration 06/14/2018  . Personal history of colonic polyps 01/29/2013  . Internal prolapsed hemorrhoids 01/29/2013    Orientation RESPIRATION BLADDER Height & Weight     Self  Normal Incontinent Weight: 54 kg Height:  5' 3.5" (161.3 cm)  BEHAVIORAL SYMPTOMS/MOOD NEUROLOGICAL BOWEL NUTRITION STATUS      Continent Diet  AMBULATORY STATUS COMMUNICATION OF NEEDS Skin   Extensive Assist Verbally Normal, Other (Comment) (moisture associated skin damage R/L buttocks)                        Personal Care Assistance Level of Assistance  Bathing, Dressing, Total care Bathing Assistance: Maximum assistance   Dressing Assistance: Maximum assistance Total Care Assistance: Maximum assistance   Functional Limitations Info             SPECIAL CARE FACTORS FREQUENCY  PT (By licensed PT), OT (By licensed OT)     PT Frequency: 5x weekly OT Frequency: 5x weekly            Contractures Contractures Info: Not present    Additional Factors Info  Code Status, Allergies Code Status Info: DNR Allergies Info: codeine, Tape           Current Medications (01/08/2020):  This is the current hospital active medication list Current Facility-Administered Medications  Medication Dose Route Frequency Provider Last Rate Last Admin  . acetaminophen (TYLENOL) tablet 650 mg  650 mg Oral Q6H PRN Oretha Milch D, MD       Or  . acetaminophen (TYLENOL) suppository 650 mg  650 mg Rectal Q6H PRN Oretha Milch D, MD      . albuterol (PROVENTIL) (2.5 MG/3ML) 0.083% nebulizer solution 2.5 mg  2.5 mg Nebulization Q4H PRN Oretha Milch D, MD   2.5 mg at 01/07/20 1445  . busPIRone (BUSPAR) tablet 10 mg  10 mg Oral TID Oretha Milch D, MD   10 mg at 01/08/20 1012  . escitalopram (LEXAPRO) tablet 10 mg  10 mg Oral Daily Oretha Milch D, MD   10 mg at 01/08/20 1013  .  folic acid (FOLVITE) tablet 1 mg  1 mg Oral Daily Oretha Milch D, MD   1 mg at 01/08/20 1011  . furosemide (LASIX) tablet 20 mg  20 mg Oral Daily Samuella Cota, MD   20 mg at 01/08/20 1012  . guaiFENesin (MUCINEX) 12 hr tablet 600 mg  600 mg Oral BID Oretha Milch D, MD   600 mg at 01/08/20 1012  . heparin injection 5,000 Units  5,000 Units Subcutaneous Q8H Oretha Milch D, MD   5,000 Units at 01/08/20 [phone]  . ipratropium-albuterol (DUONEB) 0.5-2.5 (3) MG/3ML nebulizer solution 3 mL  3 mL Nebulization TID Samuella Cota, MD   3 mL at 01/08/20 0846  . MEDLINE mouth rinse  15 mL Mouth Rinse BID Oretha Milch D, MD    15 mL at 01/06/20 2154  . methylPREDNISolone sodium succinate (SOLU-MEDROL) 125 mg/2 mL injection 60 mg  60 mg Intravenous Q24H Samuella Cota, MD   60 mg at 01/08/20 1013  . multivitamin with minerals tablet 1 tablet  1 tablet Oral Daily Oretha Milch D, MD   1 tablet at 01/08/20 1011  . ondansetron (ZOFRAN) injection 4 mg  4 mg Intravenous Q6H PRN Oretha Milch D, MD   4 mg at 01/04/20 1738  . pantoprazole (PROTONIX) EC tablet 40 mg  40 mg Oral QAC breakfast Samuella Cota, MD   40 mg at 01/08/20 1011  . senna-docusate (Senokot-S) tablet 1 tablet  1 tablet Oral QHS PRN Oretha Milch D, MD      . spironolactone (ALDACTONE) tablet 12.5 mg  12.5 mg Oral Daily Samuella Cota, MD   12.5 mg at 01/08/20 1013  . thiamine tablet 100 mg  100 mg Oral Daily Oretha Milch D, MD   100 mg at 01/08/20 1012   Or  . thiamine (B-1) injection 100 mg  100 mg Intravenous Daily Oretha Milch D, MD      . traMADol Veatrice Bourbon) tablet 50 mg  50 mg Oral Q6H PRN Desiree Hane, MD         Discharge Medications: Please see discharge summary for a list of discharge medications.  Relevant Imaging Results:  Relevant Lab Results:   Additional Information SS#[id number] [address] [place], [place]

## 2020-01-08 NOTE — Progress Notes (Addendum)
PROGRESS NOTE  PETER DAQUILA SFK:[id number] DOB: 20-Mar-1951 DOA: 01/04/2020 PCP: Wenda Low, MD  Brief History   69 year old woman PMH alcoholism with daily use, presented with increasing fatigue, nausea, vomiting and abdominal pain.  Drinks 2-3 bottles of wine daily.  Admitted for alcoholic hepatitis with moderate ascites  A & P  Alcoholic hepatitis with moderate ascites, elevated LFTs, elevated INR, probable cirrhosis. Maddrey's Discriminant Function 9.4 --Right upper quadrant ultrasound suggested diffuse hepatocellular disease or possible cirrhosis, portal hypertension.  --Status post ultrasound guided paracentesis 2.4 L clear yellow fluid 6/26, no evidence of SBP.  --Check CMP in a.m.   --follow-up with gastroenterology in 4 weeks to further evaluate cirrhosis.  Continue low-dose diuretics per gastroenterology as blood pressure can tolerate.  Alcohol use disorder, alcoholism with daily drinking 2-3 bottles of wine per day, with acute withdrawal --Worse today, more confused.  No other features of withdrawal at this time.  May be steroid-induced.  Monitor clinically.  Acute metabolic encephalopathy.  Suspect related to liver disease rather than withdrawal. --Check serum ammonia to rule out hepatic encephalopathy. --Empiric lactulose  Macrocytic anemia, stable --Anemia stable.  No further evaluation suggested. --Folate slightly low.  Continue replacement.  B12 adequate --Continue multivitamin  Borderline hypotension likely secondary to hypoalbuminemia --Resolved.  COPD exacerbation complicated by ongoing smoking.  PMH right lung cancer. --Continues to improve.  Change to oral steroids tomorrow.  Continue bronchodilators.  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  Needs outpatient follow-up with ENT.  12 mm nodule in the left lower lung is  similar to previous PET-CT scan. (from CT 08/2018). See PET-CT report, suggesting possible low-grade malignancy.  --CXR left-hemithorax unremarkable, follow-up as an outpatient.  Peripheral vascular disease --follow-up as an outpatient  Disposition Plan:   Discussion: COPD improving, will repeat CMP in a.m. but liver function appears to be more chronic now than acute.  Main issue is confusion, lack of capacity and ability to participate in decision-making today.  She lives alone and is an alcoholic, at this point no safe discharge.  Would recommend SNF if patient will accept although in the past she has refused.  Status is: Inpatient  Remains inpatient appropriate because:Altered mental status and IV treatments appropriate due to intensity of illness or inability to take PO  Dispo: The patient is from: Home              Anticipated d/c is to: Home vs. SNF depending on whether the patient will accept SNF or not.              Anticipated d/c date is: 2 days              Patient currently is not medically stable to d/c.  DVT prophylaxis:  heparin injection 5,000 Units Start: 01/04/20 1730 Code Status: DNR Family Communication:   Murray Hodgkins, MD  [facility] Direct contact: see [email or web address] (further directions at bottom of note if needed) 7PM-7AM contact night coverage as at bottom of note 01/08/2020, 2:51 PM  LOS: 4 days   Significant Hospital Events   . 6/25 admitted for alcoholic hepatitis with ascites   Consults:  . Gastroenterology   Procedures:  . 6/26 ultrasound-guided paracentesis 2.4 L clear yellow fluid  Significant Diagnostic Tests:  Marland Kitchen    Micro Data:  . SARS-CoV-2 negative   Antimicrobials:  .   Interval History/Subjective  Remains confused.  Does not offer intelligible history.  Objective   Vitals:  Vitals:   01/08/20 1015 01/08/20 1226  BP:  109/66  Pulse:  86  Resp:  (!) 24  Temp:  97.6 F (36.4 C)  SpO2: 95% 96%     Exam:  Constitutional.  Appears confused, chronically ill but not toxic.  Does not participate with examination. Psychiatric.  Cannot assess mood or affect.  Speech is clear, brief. Cardiovascular.  Regular rate and rhythm.  No murmur, rub or gallop.  Telemetry sinus rhythm.  No significant lower extremity edema. Respiratory.  Fair air movement.  No frank wheezes, rales or rhonchi today.  Respiratory effort appears grossly normal.  I have personally reviewed the following:   Today's Data   INR 1.3  Scheduled Meds: . busPIRone  10 mg Oral TID  . escitalopram  10 mg Oral Daily  . folic acid  1 mg Oral Daily  . furosemide  20 mg Oral Daily  . guaiFENesin  600 mg Oral BID  . heparin  5,000 Units Subcutaneous Q8H  . ipratropium-albuterol  3 mL Nebulization TID  . mouth rinse  15 mL Mouth Rinse BID  . methylPREDNISolone (SOLU-MEDROL) injection  60 mg Intravenous Q24H  . multivitamin with minerals  1 tablet Oral Daily  . pantoprazole  40 mg Oral QAC breakfast  . spironolactone  12.5 mg Oral Daily  . thiamine  100 mg Oral Daily   Or  . thiamine  100 mg Intravenous Daily   Continuous Infusions:  Principal Problem:   Alcoholic hepatitis with ascites Active Problems:   Cigarette smoker   Elevated LFTs   Hypokalemia   Macrocytic anemia   Hypotension   COPD exacerbation (HCC)   Alcoholism (HCC)   Tobacco use disorder   Pressure injury of skin   LOS: 4 days   How to contact the [facility] Attending or Consulting provider [place] or covering provider during after hours [place], for this patient?  1. Check the care team in [facility] and look for a) attending/consulting [facility] provider listed and b) the [facility] team listed 2. Log into [email or web address] and use [place] universal password to access. If you do not have the password, please contact the hospital operator. 3. Locate the [facility] provider you are looking for under [facility] and page to a number that you can be directly  reached. 4. If you still have difficulty reaching the provider, please page the [facility] (Director on Call) for the Hospitalists listed on amion for assistance.

## 2020-01-08 NOTE — Progress Notes (Signed)
  Speech Language Pathology Treatment: Dysphagia  Patient Details Name: [name] MRN: [id number] DOB: [birth year] Today's Date: 01/08/2020 Time: 5597-4163 SLP Time Calculation (min) (ACUTE ONLY): 10 min  Assessment / Plan / Recommendation Clinical Impression  SLP observed RN providing pt with liquid medicine, Delayed 2nd reflexive swallow noted but no indications of aspiration.  Pt was sitting upright in bedside chair for medication administration but soon scooted down in the chair.  SLP requested staff assist to put pt back in bed as pt did not follow directions.  OTs arrived to assist transfer to bed- Thank you.     Pt did not answer SLP questions and kept her eyes closed during remainder of session. She declined to consume any more po even after SLP requested.    Recommend to consider advancing to full liquids and SLP follow up for readiness for advancement.   RN denies pt having coughing with intake today.  Pt did not communicate with SLP - suspect more behavioral than mentation however per RN ammonia is elevated.    Will follow up next date for advancement in diet and to assure tolerance of po  Of note, per MDs note from 6/28, pt continues with lateral lingual lesion and follow up advised as OP.     HPI HPI: 69 yo female adm to [facility] with fatigue, nausea/vomiting - found to have alcoholic hepatitis with moderate ascities.  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  Per Dr Everett Graff note, pt continues with lateral tongue lesion from his observation on 01/07/2020.      SLP Plan  Continue with current plan of care       Recommendations  Diet recommendations: Thin liquid (full liquids) Liquids provided via: Straw;Cup Medication Administration: Whole meds with puree Supervision: Staff to assist with self feeding Compensations: Slow rate;Small sips/bites Postural Changes and/or Swallow Maneuvers: Seated  upright 90 degrees;Upright 30-60 min after meal                Oral Care Recommendations: Oral care QID Follow up Recommendations: None SLP Visit Diagnosis: Dysphagia, unspecified (R13.10) Plan: Continue with current plan of care       GO                Macario Golds 01/08/2020, 4:25 PM   Kathleen Lime, MS [place] Office [phone]

## 2020-01-08 NOTE — Progress Notes (Signed)
OT Treatment Note  Pt alerted by ST that pt sliding forward in chair apparently trying to lay down. Pt with increased lethargy and not following commadns, keeping eyes shut. Responding to noxious stimuli. Required Total A +2 to lift pt and return to bed. BP 134/105 once returned to bed. Nsg notified. Will continue to follow acutely.     01/08/20 1651  OT Visit Information  Last OT Received On 01/08/20  Assistance Needed +2  History of Present Illness 69 year old woman PMH alcoholism with daily use, COPD, lung CA, L reverse TSA 2/202,  presented with increasing fatigue, nausea, vomiting and abdominal pain.  Drinks 2-3 bottles of wine daily.  Admitted for alcoholic hepatitis with moderate ascites, possible tongue CA  Precautions  Precautions Fall  Precaution Comments monitor O2 sats  Pain Assessment  Pain Assessment Faces  Faces Pain Scale 4  Pain Location moaning when touched at times  Pain Descriptors / Indicators Moaning  Pain Intervention(s) Limited activity within patient's tolerance  Cognition  Arousal/Alertness Lethargic  Behavior During Therapy Flat affect  Overall Cognitive Status Impaired/Different from baseline  Area of Impairment Orientation;Attention;Memory;Following commands;Safety/judgement;Awareness;Problem solving  Orientation Level Disoriented to;Place;Time;Situation  Current Attention Level Focused  Memory Decreased recall of precautions;Decreased short-term memory  Following Commands  (not following commands)  Safety/Judgement Decreased awareness of safety;Decreased awareness of deficits  Awareness Intellectual  Problem Solving Slow processing;Decreased initiation;Difficulty sequencing;Requires verbal cues;Requires tactile cues  General Comments more lethargic; not following commands  Bed Mobility  Bed Mobility Sit to Supine  Sit to supine Total assist  Balance  Standing balance-Leahy Scale Zero  Transfers  Overall transfer level Needs assistance  Sit to  Stand Total assist;+2 physical assistance  OT - End of Session  Equipment Utilized During Treatment Gait belt  Activity Tolerance Patient limited by lethargy  Patient left in bed;with call bell/phone within reach;with bed alarm set;with restraints reapplied  Nurse Communication Mobility status;Other (comment) (increased lethargy)  OT Assessment/Plan  OT Plan Discharge plan remains appropriate  OT Visit Diagnosis Unsteadiness on feet (R26.81);Other abnormalities of gait and mobility (R26.89);Muscle weakness (generalized) (M62.81);Other symptoms and signs involving cognitive function;Pain  Pain - part of body  (general discomfort)  OT Frequency (ACUTE ONLY) Min 2X/week  Follow Up Recommendations SNF;Supervision/Assistance - 24 hour  OT Equipment 3 in 1 bedside commode  AM-PAC OT "6 Clicks" Daily Activity Outcome Measure (Version 2)  Help from another person eating meals? 1  Help from another person taking care of personal grooming? 1  Help from another person toileting, which includes using toliet, bedpan, or urinal? 1  Help from another person bathing (including washing, rinsing, drying)? 1  Help from another person to put on and taking off regular upper body clothing? 1  Help from another person to put on and taking off regular lower body clothing? 1  6 Click Score 6  OT Goal Progression  Progress towards OT goals Not progressing toward goals - comment (increased lethargy)  Acute Rehab OT Goals  Patient Stated Goal none stated  OT Goal Formulation Patient unable to participate in goal setting  Time For Goal Achievement 01/22/20  Potential to Achieve Goals Fair  ADL Goals  Pt Will Perform Upper Body Bathing with set-up;with supervision;sitting  Pt Will Perform Lower Body Bathing with set-up;with supervision;sit to/from stand  Pt Will Transfer to Toilet with min guard assist;bedside commode;ambulating  Pt Will Perform Toileting - Clothing Manipulation and hygiene with  supervision;sitting/lateral leans;sit to/from stand  Additional ADL Goal #1 Pt will verbalize  3 strategies to reduce risk of falls with min vc  OT Time Calculation  OT Start Time (ACUTE ONLY) 1608  OT Stop Time (ACUTE ONLY) 1622  OT Time Calculation (min) 14 min  OT General Charges  $OT Visit 1 Visit  OT Treatments  $Self Care/Home Management  8-22 mins  Maurie Boettcher, OT/L   Acute OT Clinical Specialist [place] Pager [phone] Office [phone]

## 2020-01-08 NOTE — Progress Notes (Signed)
Occupational Therapy Evaluation Patient Details Name: [name] MRN: [id number] DOB: 01-Aug-1950 Today's Date: 01/08/2020    History of Present Illness 69 year old woman PMH alcoholism with daily use, COPD,  lung CA. presented with increasing fatigue, nausea, vomiting and abdominal pain.  Drinks 2-3 bottles of wine daily.  Admitted for alcoholic hepatitis with moderate ascites, possible tongue CA   Clinical Impression   PTA pt living alone and apparently independent. Able to increase level of arousal to mobilize to EOB to complete ADL (pt saturated in urine) and transfer to recliner with Mod A @ RW level. Pt apparently confused and continued to pull off [place] with SpO2 desat to 85 on RA. Pt left in chair for speech to further assess swallow. Pt with significant deficits as noted below. Will benefit from SNF for rehab. Unsure if pt would be agreeable to alcohol rehab. Will follow acutely to facilitate safe DC home.     Follow Up Recommendations  SNF;Supervision/Assistance - 24 hour ; substance abuse rehab program   Equipment Recommendations  3 in 1 bedside commode    Recommendations for Other Services       Precautions / Restrictions Precautions Precautions: Fall Precaution Comments: monitor O2 sats      Mobility Bed Mobility Overal bed mobility: Needs Assistance       Supine to sit: Min assist     General bed mobility comments: A to transitioin trunk upright  Transfers Overall transfer level: Needs assistance Equipment used: Rolling walker (2 wheeled) Transfers: Sit to/from [facility] Sit to Stand: Mod assist Stand pivot transfers: Mod assist       General transfer comment: increased time    Balance Overall balance assessment: Needs assistance Sitting-balance support: Feet supported Sitting balance-Leahy Scale: Fair       Standing balance-Leahy Scale: Poor                             ADL either performed or assessed with clinical  judgement   ADL Overall ADL's : Needs assistance/impaired Eating/Feeding: Minimal assistance Eating/Feeding Details (indicate cue type and reason): Able to bring cup to mouth and drink; difficulty sustaining attention and maintaining level of arousal Grooming: Moderate assistance Grooming Details (indicate cue type and reason): due to level of arousal; Able to wash face adn comb hair once attending to task Upper Body Bathing: Sitting;Maximal assistance   Lower Body Bathing: Maximal assistance;Sit to/from stand   Upper Body Dressing : Maximal assistance;Sitting   Lower Body Dressing: Maximal assistance;Sit to/from stand   Toilet Transfer: Moderate assistance;Stand-pivot   Toileting- Clothing Manipulation and Hygiene: Total assistance Toileting - Clothing Manipulation Details (indicate cue type and reason): incontinent of urine     Functional mobility during ADLs: Moderate assistance;Rolling walker;Cueing for safety;Cueing for sequencing       Vision Baseline Vision/History: Wears glasses       Perception     Praxis      Pertinent Vitals/Pain Pain Assessment: No/denies pain Faces Pain Scale: Hurts little more Pain Location: moaning when touched at times Pain Descriptors / Indicators: Moaning Pain Intervention(s): Limited activity within patient's tolerance     Hand Dominance Right   Extremity/Trunk Assessment Upper Extremity Assessment Upper Extremity Assessment: Generalized weakness   Lower Extremity Assessment Lower Extremity Assessment: Defer to PT evaluation   Cervical / Trunk Assessment Cervical / Trunk Assessment: Kyphotic   Communication Communication Communication: No difficulties   Cognition Arousal/Alertness: Lethargic Behavior During Therapy: Restless  Overall Cognitive Status: Impaired/Different from baseline Area of Impairment: Orientation;Attention;Memory;Following commands;Safety/judgement;Awareness;Problem solving                  Orientation Level: Disoriented to;Place;Time;Situation Current Attention Level: Focused Memory: Decreased recall of precautions;Decreased short-term memory Following Commands: Follows one step commands with increased time Safety/Judgement: Decreased awareness of safety;Decreased awareness of deficits Awareness: Intellectual Problem Solving: Slow processing;Decreased initiation;Difficulty sequencing;Requires verbal cues;Requires tactile cues General Comments: continued to remove Coats regardless of educaiton; trying to wipe face with cord; more alert and ableto follow commands once OOB   General Comments       Exercises     Shoulder Instructions      Home Living Family/patient expects to be discharged to:: Private residence Living Arrangements: Alone Available Help at Discharge: Family;Available PRN/intermittently Type of Home: Apartment Home Access: Level entry     Home Layout: One level     Bathroom Shower/Tub: Teacher, early years/pre: Standard Bathroom Accessibility: Yes How Accessible: Accessible via walker Home Equipment: Walker - 2 wheels          Prior Functioning/Environment Level of Independence: Independent with assistive device(s)        Comments: unsure how much family assists        OT Problem List: Decreased strength;Decreased activity tolerance;Impaired balance (sitting and/or standing);Decreased cognition;Decreased coordination;Decreased safety awareness;Decreased knowledge of use of DME or AE;Cardiopulmonary status limiting activity;Pain      OT Treatment/Interventions: Self-care/ADL training;Therapeutic exercise;DME and/or AE instruction;Therapeutic activities;Cognitive remediation/compensation;Patient/family education;Balance training    OT Goals(Current goals can be found in the care plan section) Acute Rehab OT Goals Patient Stated Goal: to go home OT Goal Formulation: Patient unable to participate in goal setting Time For Goal  Achievement: 01/22/20 Potential to Achieve Goals: Good  OT Frequency: Min 2X/week   Barriers to D/C:            Co-evaluation              AM-PAC OT "6 Clicks" Daily Activity     Outcome Measure Help from another person eating meals?: A Little Help from another person taking care of personal grooming?: A Lot Help from another person toileting, which includes using toliet, bedpan, or urinal?: Total Help from another person bathing (including washing, rinsing, drying)?: A Lot Help from another person to put on and taking off regular upper body clothing?: A Lot Help from another person to put on and taking off regular lower body clothing?: A Lot 6 Click Score: 12   End of Session Equipment Utilized During Treatment: Gait belt;Rolling walker;Oxygen (2L) Nurse Communication: Mobility status;Other (comment) (close S wehn OOB; O2 desat)  Activity Tolerance: Patient limited by lethargy Patient left: in chair;with call bell/phone within reach;with chair alarm set  OT Visit Diagnosis: Unsteadiness on feet (R26.81);Other abnormalities of gait and mobility (R26.89);Muscle weakness (generalized) (M62.81);Other symptoms and signs involving cognitive function;Pain Pain - part of body:  (generalized discomfort)                Time: 5035-4656 OT Time Calculation (min): 31 min Charges:  OT General Charges $OT Visit: 1 Visit OT Evaluation $OT Eval Moderate Complexity: 1 Mod OT Treatments $Self Care/Home Management : 8-22 mins  Maurie Boettcher, OT/L   Acute OT Clinical Specialist [place] Pager [phone] Office [phone]   [facility] 01/08/2020, 4:38 PM

## 2020-01-09 LAB — COMPREHENSIVE METABOLIC PANEL
ALT: 77 U/L — ABNORMAL HIGH (ref 0–44)
AST: 111 U/L — ABNORMAL HIGH (ref 15–41)
Albumin: 2.3 g/dL — ABNORMAL LOW (ref 3.5–5.0)
Alkaline Phosphatase: 132 U/L — ABNORMAL HIGH (ref 38–126)
Anion gap: 9 (ref 5–15)
BUN: 30 mg/dL — ABNORMAL HIGH (ref 8–23)
CO2: 28 mmol/L (ref 22–32)
Calcium: 8.6 mg/dL — ABNORMAL LOW (ref 8.9–10.3)
Chloride: 102 mmol/L (ref 98–111)
Creatinine, Ser: 0.9 mg/dL (ref 0.44–1.00)
GFR calc Af Amer: >60 mL/min (ref >60)
GFR calc non Af Amer: >60 mL/min (ref >60)
Glucose, Bld: 100 mg/dL — ABNORMAL HIGH (ref 70–99)
Potassium: 3.8 mmol/L (ref 3.5–5.1)
Sodium: 139 mmol/L (ref 135–145)
Total Bilirubin: 2.9 mg/dL — ABNORMAL HIGH (ref 0.3–1.2)
Total Protein: 5.2 g/dL — ABNORMAL LOW (ref 6.5–8.1)

## 2020-01-09 LAB — AMMONIA: Ammonia: 55 umol/L — ABNORMAL HIGH (ref 9–35)

## 2020-01-09 MED ORDER — IPRATROPIUM-ALBUTEROL 0.5-2.5 (3) MG/3ML IN SOLN
3.0000 mL | Freq: Two times a day (BID) | RESPIRATORY_TRACT | Status: DC
  Administered 2020-01-09: 3 mL via RESPIRATORY_TRACT
  Filled 2020-01-09 (×2): qty 3

## 2020-01-09 NOTE — Progress Notes (Signed)
PROGRESS NOTE    [name]  EHM:[id number] DOB: [birth year] DOA: 01/04/2020 PCP: Wenda Low, MD   Brief Narrative:  69 year old woman PMH alcoholism with daily use, presented with increasing fatigue, nausea, vomiting and abdominal pain.  Drinks 2-3 bottles of wine daily.  Admitted for alcoholic hepatitis with moderate ascites -mental status continues to wax and wane, continues altered from apparent baseline likely requiring placement for safety.  Assessment & Plan:   Principal Problem:   Alcoholic hepatitis with ascites Active Problems:   Cigarette smoker   Elevated LFTs   Hypokalemia   Macrocytic anemia   Hypotension   COPD exacerbation (HCC)   Alcoholism (HCC)   Tobacco use disorder   Pressure injury of skin   Acute metabolic encephalopathy   Alcoholic hepatitis with moderate ascites, elevated LFTs, elevated INR, probable cirrhosis. Maddrey's Discriminant Function 9.4 - Right upper quadrant ultrasound suggested diffuse hepatocellular disease or possible cirrhosis, portal hypertension.  - Status post ultrasound guided paracentesis 2.4 L clear yellow fluid 6/26, no evidence of SBP.  - AST, ALT appear to be minimally improving, unclear baseline -previous within normal limits a year ago - Follow-up with gastroenterology in 4 weeks to further evaluate cirrhosis. Continue low-dose diuretics per gastroenterology as blood pressure can tolerate.  Alcohol use/abuse disorder, alcoholism with daily drinking 2-3 bottles of wine per day, with acute withdrawal -being - Patient remains confused, alert to person only. - No other features of withdrawal at this time.   - Family contacted previously, likely discharge to skilled nursing facility for ongoing care.  Acute metabolic encephalopathy  - Suspect related to liver disease rather than withdrawal. - Check serum ammonia to rule out hepatic encephalopathy. - Empiric lactulose  Macrocytic anemia, stable likely 2/2 above - Anemia  stable. No further evaluation suggested. - Continue multivitamin, B12, folate  Borderline hypotension likely secondary to hypoalbuminemia - Resolved  COPD exacerbation complicated by ongoing smoking.   - PMH right lung cancer. - Continues to improve. Change to oral steroids tomorrow. Continue bronchodilators.  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  Needs outpatient follow-up with ENT.  12 mm nodule in the left lower lung is similar to previous PET-CT scan. (from CT 08/2018).  - Per PET-CT report, suggesting possible low-grade malignancy.  - CXR left-hemithorax unremarkable, follow-up as an outpatient.  Skin pressure injury, POA - Stage 2 per documentation  Peripheral vascular disease - Follow-up as an outpatient  DVT prophylaxis: Heparin Code Status: DNR Family Communication: None available  Status is: INPT  Dispo: The patient is from: Home              Anticipated d/c is to: TBD              Anticipated d/c date is: 5-96h              Patient currently NOT medically stable for discharge due to ongoing mental status aberrancy - unable to make decisions currently due to acute medical condition as above with limited capacity to make decisions.  Consultants:   None  Procedures:   None indicated  Antimicrobials:  None indicated  Subjective: No acute issues or events overnight per nursing staff, patient markedly noncompliant with evaluation and exam, just yelled "get out of the room" would not answer my questions.  Objective: Vitals:   01/08/20 1951 01/08/20 2142 01/09/20 3662 01/09/20 9476  BP:  102/71 112/68   Pulse:  92 90   Resp:  18 18   Temp:  98 F (36.7 C) 98.3 F (36.8 C)   TempSrc:      SpO2: 94% 91% 93%   Weight:    53.2 kg  Height:        Intake/Output Summary (Last 24 hours) at 01/09/2020 0727 Last  data filed at 01/08/2020 1700 Gross per 24 hour  Intake 110 ml  Output 300 ml  Net -190 ml   Filed Weights   01/07/20 0643 01/08/20 0500 01/09/20 0618  Weight: 55.3 kg 54 kg 53.2 kg    Examination:  General: Agitated, unable to assess orientation given noncompliance HEENT: Normocephalic atraumatic.  Neck: Without mass or deformity.  Trachea is midline. Lungs: Without accessory muscle use. Extremities: Without cyanosis, clubbing, edema, or obvious deformity. Skin: Warm and dry, no erythema, Stage 2 sacral skin wound.  Data Reviewed: I have personally reviewed following labs and imaging studies  CBC: Recent Labs  Lab 01/04/20 1220 01/05/20 0524 01/07/20 0555  WBC 16.6* 10.4 16.5*  HGB 10.1* 9.1* 9.4*  HCT 31.0* 28.4* 28.8*  MCV 110.7* 113.1* 109.5*  PLT 200 184 616   Basic Metabolic Panel: Recent Labs  Lab 01/04/20 1220 01/04/20 1605 01/05/20 0524 01/06/20 0614 01/07/20 0555 01/09/20 0536  NA 136  --  136 134* 134* 139  K 3.3*  --  4.2 4.5 4.4 3.8  CL 96*  --  99 98 100 102  CO2 28  --  26 29 30 28   GLUCOSE 143*  --  141* 136* 95 100*  BUN 26*  --  26* 30* 37* 30*  CREATININE 1.00  --  0.78 0.98 1.05* 0.90  CALCIUM 8.4*  --  8.0* 8.3* 8.3* 8.6*  MG  --  1.8 2.3  --   --   --   PHOS  --  3.8  --   --   --   --    GFR: Estimated Creatinine Clearance: 48.8 mL/min (by C-G formula based on SCr of 0.9 mg/dL). Liver Function Tests: Recent Labs  Lab 01/04/20 1220 01/05/20 0524 01/06/20 0614 01/07/20 0555 01/09/20 0536  AST 135* 110* 99* 196* 111*  ALT 50* 44 48* 70* 77*  ALKPHOS 152* 133* 131* 125 132*  BILITOT 3.9* 3.4* 2.8* 2.6* 2.9*  PROT 5.7* 5.3* 5.4* 5.2* 5.2*  ALBUMIN 2.3* 2.2* 2.3* 2.2* 2.3*   Recent Labs  Lab 01/04/20 1343  LIPASE 52*   Recent Labs  Lab 01/04/20 1605 01/08/20 1513 01/09/20 0536  AMMONIA 47* 50* 55*   Coagulation Profile: Recent Labs  Lab 01/04/20 1343 01/05/20 0524 01/06/20 0614 01/07/20 0555 01/08/20 0530  INR  1.3* 1.4* 1.4* 1.4* 1.3*   Cardiac Enzymes: No results for input(s): CKTOTAL, CKMB, CKMBINDEX, TROPONINI in the last 168 hours. BNP (last 3 results) No results for input(s): PROBNP in the last 8760 hours. HbA1C: No results for input(s): HGBA1C in the last 72 hours. CBG: Recent Labs  Lab 01/04/20 1249  GLUCAP 126*   Lipid Profile: No results for input(s): CHOL, HDL, LDLCALC, TRIG, CHOLHDL, LDLDIRECT in the last 72 hours. Thyroid Function Tests: No results for input(s): TSH, T4TOTAL, FREET4, T3FREE, THYROIDAB in the last 72 hours. Anemia Panel: No results for input(s): VITAMINB12, FOLATE, FERRITIN, TIBC, IRON, RETICCTPCT in the last 72 hours. Sepsis Labs: Recent Labs  Lab 01/04/20 1341 01/04/20 1605  LATICACIDVEN 2.3* 1.1    Recent Results (from the past 240  hour(s))  SARS Coronavirus 2 by RT PCR (hospital order, performed in [facility] hospital lab) Nasopharyngeal Nasopharyngeal Swab     Status: None   Collection Time: 01/04/20  4:05 PM   Specimen: Nasopharyngeal Swab  Result Value Ref Range Status   SARS Coronavirus 2 NEGATIVE NEGATIVE Final    Comment: (NOTE) SARS-CoV-2 target nucleic acids are NOT DETECTED.  The SARS-CoV-2 RNA is generally detectable in upper and lower respiratory specimens during the acute phase of infection. The lowest concentration of SARS-CoV-2 viral copies this assay can detect is 250 copies / mL. A negative result does not preclude SARS-CoV-2 infection and should not be used as the sole basis for treatment or other patient management decisions.  A negative result may occur with improper specimen collection / handling, submission of specimen other than nasopharyngeal swab, presence of viral mutation(s) within the areas targeted by this assay, and inadequate number of viral copies (<250 copies / mL). A negative result must be combined with clinical observations, patient history, and epidemiological information.  Fact Sheet for Patients:    StrictlyIdeas.no  Fact Sheet for Healthcare Providers: BankingDealers.co.za  This test is not yet approved or  cleared by the [place] FDA and has been authorized for detection and/or diagnosis of SARS-CoV-2 by FDA under an Emergency Use Authorization (EUA).  This EUA will remain in effect (meaning this test can be used) for the duration of the COVID-19 declaration under Section 564(b)(1) of the Act, 21 U.S.C. section 360bbb-3(b)(1), unless the authorization is terminated or revoked sooner.  Performed at [facility], [place] [address] [place], [place] [address]   Urine culture     Status: Abnormal   Collection Time: 01/05/20  6:00 AM   Specimen: Urine, Random  Result Value Ref Range Status   Specimen Description   Final    URINE, RANDOM Performed at [place] [address] [place], [place] [address]    Special Requests   Final    NONE Performed at [facility], [place] [address] [place], [place] [address]    Culture (A)  Final    30,000 COLONIES/mL MULTIPLE SPECIES PRESENT, SUGGEST RECOLLECTION   Report Status 01/06/2020 FINAL  Final  Gram stain     Status: None   Collection Time: 01/05/20 12:21 PM   Specimen: Fluid  Result Value Ref Range Status   Specimen Description FLUID  Final   Special Requests NONE  Final   Gram Stain   Final    WBC PRESENT, PREDOMINANTLY PMN NO ORGANISMS SEEN CYTOSPIN SMEAR Performed at [facility], [place] [address] [place], [place] [address]    Report Status 01/05/2020 FINAL  Final  Culture, body fluid-bottle     Status: None (Preliminary result)   Collection Time: 01/05/20 12:21 PM   Specimen: Fluid  Result Value Ref Range Status   Specimen Description FLUID  Final   Special Requests NONE  Final   Culture   Final    NO GROWTH 4 DAYS Performed at [facility] [address], [place], [place] [address]    Report  Status PENDING  Incomplete    Radiology Studies: No results found.  Scheduled Meds: . busPIRone  10 mg Oral TID  . escitalopram  10 mg Oral Daily  . folic acid  1 mg Oral Daily  . furosemide  20 mg Oral Daily  . guaiFENesin  600 mg Oral BID  . heparin  5,000 Units Subcutaneous Q8H  .  ipratropium-albuterol  3 mL Nebulization BID  . lactulose  20 g Oral BID  . mouth rinse  15 mL Mouth Rinse BID  . multivitamin with minerals  1 tablet Oral Daily  . pantoprazole  40 mg Oral QAC breakfast  . predniSONE  40 mg Oral Q breakfast  . spironolactone  12.5 mg Oral Daily  . thiamine  100 mg Oral Daily   Or  . thiamine  100 mg Intravenous Daily   Continuous Infusions:   LOS: 5 days   Time spent: 40 min  Little Ishikawa, DO [facility]  If 7PM-7AM, please contact night-coverage [email or web address]  01/09/2020, 7:27 AM

## 2020-01-09 NOTE — TOC Progression Note (Signed)
Transition of Care [facility]) - Progression Note    Patient Details  Name: [name] MRN: [id number] Date of Birth: 1951/06/24  Transition of Care [facility]) CM/SW Contact  Larin Depaoli, Juliann Pulse, RN Phone Number: 01/09/2020, 10:59 AM  Clinical Narrative:  1. 1.7 mi [place] at [place], [place] [address] [phone] Overall rating Below average 2. 2 mi [facility] [place], [place] [address] [phone] Overall rating Below average 3. 2.1 mi [place] [facility] [place], [place] [address] [phone] Overall rating Much below average 4. 2.2 mi [place] [facility] [employer] [place], [place] [address] [phone] Overall rating Much above average 5. 2.2 mi [place], [place] [address] [phone] Overall rating Average 6. 2.7 [place], [place] [address] [phone] Overall rating Above average 7. 3.2 mi [place] [address] [place], [place] [address] [phone] Overall rating Below average 8. 3.3 [place] [address] [place], [place] [address] [phone] Overall rating Below average 9. 3.5 mi [facility] [place], [place] [address] [phone] Overall rating Average 10. 4.3 [place], [place] [address] [phone] Overall rating Below average 11. 4.7 mi [place] at [place], [place] [address] [phone] Overall rating Much above average 12. 5.2 mi [facility] [address] [place], [place] [address] [phone] Overall rating Much above  average 13. 6.2 mi [place], [place] [address] [phone] Overall rating Average 14. 8.1 [place], [place] [address] [phone] Overall rating Above average 15. 8.1 mi [facility] and [place], [place] [address] [phone] Overall rating Much below average   Overall rating Average 2. 1.6 mi [facility] [place], [place] [address] [phone] Overall rating Average 3. 10.8 [facility] [address] [place], [place] [address] [phone] Overall rating Much above average 4. 74.1 [facility] [address] [place], [place] [address] [phone] Overall rating Below average 5. 11.5 mi [facility] [place], [place] [address] [phone] Overall rating Average 6. 13.2 [facility] and [place] [address] [phone] Overall rating Much below average 7. 14.5 mi [facility] [address] [place] [address] [phone] Overall rating Not available1 8. 18.1 mi [facility] [place] [address] [phone] Overall rating Much above average 9. 18.9 mi [place] [address] [place], [place] [address] [phone] Overall rating Below average 10. 19.9 mi [place] [address] [place] [address] [phone] Overall rating Above average 11[place] [address] [phone] Overall rating Below average 12. 21.2 [place] [address] [place] [address] [phone] Overall rating Average 13. 21.4 mi [place] [address] [phone] Overall rating Average  39. 23.2 mi [facility] [address] [place] [address] [phone] Overall rating Average 15. 23.3 mi [place], [place] [address] [phone] Overall rating Much below average     Expected Discharge Plan: [place] Barriers to Discharge: Insurance Authorization  Expected Discharge Plan and Services Expected Discharge Plan: [place]   Discharge Planning Services: CM Consult Post Acute Care Choice: [place] Living arrangements for the past 2 months: Single Family Home                 DME Arranged: N/A DME Agency: NA       HH Arranged: NA HH Agency: NA         Social Determinants of Health (SDOH) Interventions    Readmission Risk Interventions Readmission Risk Prevention Plan 01/08/2020  Transportation Screening Complete  HRI or Home Care Consult Complete  Social Work Consult for [place] Planning/Counseling Complete  Palliative Care Screening Not Applicable  Medication Review Press photographer) Complete  Some recent data might be hidden

## 2020-01-09 NOTE — TOC Progression Note (Signed)
Transition of Care [facility]) - Progression Note    Patient Details  Name: [name] MRN: [id number] Date of Birth: 1950/12/24  Transition of Care [facility]) CM/SW Contact  Brigetta Beckstrom, Juliann Pulse, RN Phone Number: 01/09/2020, 10:54 AM  Clinical Narrative: Received bed offers-left vm w/dtr [name] [phone]-await call back for bed offers;started auth-auth [id number].Will need covid within 48hrs of d/c.      Expected Discharge Plan: Skilled Nursing Facility Barriers to Discharge: Insurance Authorization  Expected Discharge Plan and Services Expected Discharge Plan: [place]   Discharge Planning Services: CM Consult Post Acute Care Choice: [place] Living arrangements for the past 2 months: Single Family Home                 DME Arranged: N/A DME Agency: NA       HH Arranged: NA HH Agency: NA         Social Determinants of Health (SDOH) Interventions    Readmission Risk Interventions Readmission Risk Prevention Plan 01/08/2020  Transportation Screening Complete  HRI or Home Care Consult Complete  Social Work Consult for [place] Planning/Counseling Complete  Palliative Care Screening Not Applicable  Medication Review Press photographer) Complete  Some recent data might be hidden

## 2020-01-09 NOTE — TOC Progression Note (Signed)
Transition of Care [facility]) - Progression Note    Patient Details  Name: [name] MRN: [id number] Date of Birth: 25-Jan-1951  Transition of Care [facility]) CM/SW Contact  Janna Oak, Juliann Pulse, RN Phone Number: 01/09/2020, 2:47 PM  Clinical Narrative:noted patient is not medically stable-CM has provided dtr [name] with bed offers-await choice.[place] w/recent update of not medically stable-withdrew auth.Also contacted [facility] rep Cindie if needed for home health services.Continue to monitor.       Expected Discharge Plan: [place] Barriers to Discharge: Continued Medical Work up  Expected Discharge Plan and Services Expected Discharge Plan: [place]   Discharge Planning Services: CM Consult Post Acute Care Choice: [place] Living arrangements for the past 2 months: Single Family Home                 DME Arranged: N/A DME Agency: NA       HH Arranged: NA HH Agency: NA         Social Determinants of Health (SDOH) Interventions    Readmission Risk Interventions Readmission Risk Prevention Plan 01/08/2020  Transportation Screening Complete  HRI or Home Care Consult Complete  Social Work Consult for [place] Planning/Counseling Complete  Palliative Care Screening Not Applicable  Medication Review Press photographer) Complete  Some recent data might be hidden

## 2020-01-09 NOTE — Progress Notes (Signed)
°  Speech Language Pathology Treatment: Dysphagia  Patient Details Name: [name] MRN: [id number] DOB: 10-16-1950 Today's Date: 01/09/2020 Time: 1005-1016 SLP Time Calculation (min) (ACUTE ONLY): 11 min  Assessment / Plan / Recommendation Clinical Impression  Pt observed consuming medications with RN today.  She accepted medicine with applesauce and water.  Delayed swallow clinically noted - with pt frequently requiring 2nd swallow before accepting more. She does direct RN to indicate need to wait before taking next bolus fortunately.  No indications of aspiration with SLP In room with pt, however upon return from obtaining sandwich for pt , she was coughing after swallowing medication.  She does require extra time with intake to be allowed to conduct dry swallows.    Her aspiration risk remains elevated due to her mentation, decreased cooperation and premorbid occasional cough with intake *per pt statement.  Recommend encourage pt to self feed as much as able and assure she is fully upright for po.   Advised pt to recommendations including following swallow precautions.  She stated "I'm finished" and closed her eyes declining to participate.    Options include advancing to dys3/thin given her ability to articulate and her report of desire for solids and monitor to assure tolerance or continue current diet.  Reached out to MD to obtain his orders to advance to dys3/thin and follow up.    HPI HPI: 69 yo female adm to [facility] with fatigue, nausea/vomiting - found to have alcoholic hepatitis with moderate ascities.  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  Per Dr Everett Graff note, pt continues with lateral tongue lesion from his observation on 01/07/2020.      SLP Plan  Continue with current plan of care       Recommendations  Diet recommendations: Thin liquid;Nectar-thick liquid (vs dys3) Liquids provided via:  Straw;Cup Medication Administration: Whole meds with puree Supervision: Staff to assist with self feeding Compensations: Slow rate;Small sips/bites Postural Changes and/or Swallow Maneuvers: Seated upright 90 degrees;Upright 30-60 min after meal                Oral Care Recommendations: Oral care QID Follow up Recommendations: None SLP Visit Diagnosis: Dysphagia, unspecified (R13.10) Plan: Continue with current plan of care       GO                Macario Golds 01/09/2020, 11:17 AM  Kathleen Lime, MS [facility] Office [phone]

## 2020-01-09 NOTE — Plan of Care (Signed)
  Problem: Health Behavior/Discharge Planning: Goal: Ability to manage health-related needs will improve Outcome: Progressing   

## 2020-01-10 LAB — CULTURE, BODY FLUID W GRAM STAIN -BOTTLE: Culture: NO GROWTH

## 2020-01-10 NOTE — Progress Notes (Signed)
Physical Therapy Treatment Patient Details Name: [name] MRN: [id number] DOB: [birth year] Today's Date: 01/10/2020    History of Present Illness 69 year old woman PMH alcoholism with daily use, COPD, lung CA, L reverse TSA 2/202,  presented with increasing fatigue, nausea, vomiting and abdominal pain.  Drinks 2-3 bottles of wine daily.  Admitted for alcoholic hepatitis with moderate ascites, possible tongue CA    PT Comments    Pt not thrilled for OOB however allowed assist to [facility] to sit while changing soiled bed linen.  Nurse tech present and assisted.  Pt requested back to bed as soon as possible.  Continue to recommend SNF upon d/c.  Follow Up Recommendations  SNF     Equipment Recommendations  None recommended by PT    Recommendations for Other Services       Precautions / Restrictions Precautions Precautions: Fall Precaution Comments: monitor O2 sats    Mobility  Bed Mobility Overal bed mobility: Needs Assistance Bed Mobility: Supine to Sit;Sit to Supine     Supine to sit: Min assist Sit to supine: Min assist   General bed mobility comments: assist for scooting to EOB, assist for lowering trunk  Transfers Overall transfer level: Needs assistance Equipment used: 2 person hand held assist (maintained mittens) Transfers: Sit to/from [employer] Transfers Sit to Stand: Min assist;+2 safety/equipment Stand pivot transfers: +2 safety/equipment;Min assist       General transfer comment: light min assist for steadying; pt pivoted to/from [facility] to clean bed and change linen; remained on 2L O2 [place]  Ambulation/Gait             General Gait Details: declined, only allowing OOB to change linen   Stairs             Wheelchair Mobility    Modified Rankin (Stroke Patients Only)       Balance Overall balance assessment: Needs assistance         Standing balance support: Bilateral upper extremity supported Standing balance-Leahy Scale: Poor                               Cognition Arousal/Alertness: Awake/alert Behavior During Therapy: Flat affect Overall Cognitive Status: No family/caregiver present to determine baseline cognitive functioning                               Problem Solving: Slow processing;Decreased initiation;Difficulty sequencing;Requires verbal cues;Requires tactile cues General Comments: pt following some commands, pt allowed mobility today due to functional needs (change soiled bed linen) with nurse tech assist      Exercises      General Comments        Pertinent Vitals/Pain Pain Assessment: Faces Faces Pain Scale: No hurt Pain Intervention(s): Repositioned    Home Living                      Prior Function            PT Goals (current goals can now be found in the care plan section) Progress towards PT goals: Not progressing toward goals - comment (pt self limiting, cognition)    Frequency    Min 2X/week      PT Plan Current plan remains appropriate    Co-evaluation              AM-PAC PT "6 Clicks" Mobility   Outcome  Measure  Help needed turning from your back to your side while in a flat bed without using bedrails?: A Little Help needed moving from lying on your back to sitting on the side of a flat bed without using bedrails?: A Little Help needed moving to and from a bed to a chair (including a wheelchair)?: A Little Help needed standing up from a chair using your arms (e.g., wheelchair or bedside chair)?: A Little     6 Click Score: 12    End of Session Equipment Utilized During Treatment: Oxygen Activity Tolerance:  (limited by cognition) Patient left: in bed;with call bell/phone within reach;with nursing/sitter in room Nurse Communication: Mobility status PT Visit Diagnosis: Unsteadiness on feet (R26.81);Other abnormalities of gait and mobility (R26.89);Muscle weakness (generalized) (M62.81)     Time: 1419-1430 PT Time  Calculation (min) (ACUTE ONLY): 11 min  Charges:  $Therapeutic Activity: 8-22 mins                     Jannette Spanner PT, DPT Acute Rehabilitation Services Pager: [phone] Office: [phone]   York Ram E 01/10/2020, 3:29 PM

## 2020-01-10 NOTE — Progress Notes (Signed)
Patient spitting out pills and refusing medications this morning.  Unable to administer multivitamin and lactulose due to patient refusal.  Patient oriented to self only, but is aware she is in a hospital.

## 2020-01-10 NOTE — Care Management Important Message (Signed)
Important Message  Patient Details IM Letter presented to the Patient Name: [name] MRN: [id number] Date of Birth: 12-12-1950   Medicare Important Message Given:  Yes     Kerin Salen 01/10/2020, 10:44 AM

## 2020-01-10 NOTE — Progress Notes (Signed)
PROGRESS NOTE    [name]  DJT:[id number] DOB: [birth year] DOA: 01/04/2020 PCP: Wenda Low, MD   Brief Narrative:  69 year old woman PMH alcoholism with daily use, presented with increasing fatigue, nausea, vomiting and abdominal pain.  Drinks 2-3 bottles of wine daily.  Admitted for alcoholic hepatitis with moderate ascites -mental status continues to wax and wane, continues altered from apparent baseline likely requiring placement for safety and ongoing care.  Assessment & Plan:   Principal Problem:   Alcoholic hepatitis with ascites Active Problems:   Cigarette smoker   Elevated LFTs   Hypokalemia   Macrocytic anemia   Hypotension   COPD exacerbation (HCC)   Alcoholism (HCC)   Tobacco use disorder   Pressure injury of skin   Acute metabolic encephalopathy   Alcoholic hepatitis with moderate ascites, elevated LFTs, elevated INR, probable cirrhosis. Maddrey's Discriminant Function 9.4 - Right upper quadrant ultrasound suggested diffuse hepatocellular disease or possible cirrhosis, portal hypertension.  - Status post ultrasound guided paracentesis 2.4L clear yellow fluid 6/26, no evidence of SBP.  - AST, ALT appear to be minimally improving, unclear baseline -previous within normal limits a year ago - Follow-up with gastroenterology in 4 weeks to further evaluate cirrhosis. Continue low-dose diuretics per gastroenterology as blood pressure can tolerate.  Alcohol use/abuse disorder, alcoholism with daily drinking 2-3 bottles of wine per day, with acute withdrawal (resolved) - Patient remains confused, alert to person only. - No other features of withdrawal at this time.   - Family contacted previously, likely discharge to skilled nursing facility for ongoing care  Acute metabolic encephalopathy vs Wernicke's encephalopathy - Suspect related to alcohol use and abuse, ammonia minimally elevated - Empiric lactulose to cover abnormal ammonia elevation (unclear  baseline)  Macrocytic anemia, stable likely 2/2 above - Anemia stable. No further evaluation suggested. - Continue multivitamin, B12, folate  Borderline hypotension likely secondary to hypoalbuminemia - Resolved  COPD exacerbation complicated by ongoing smoking.   - PMH right lung cancer. - Continues to improve. Change to oral steroids tomorrow. Continue bronchodilators.  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  Needs outpatient follow-up with ENT.  12 mm nodule in the left lower lung is similar to previous PET-CT scan. (from CT 08/2018).  - Per PET-CT report, suggesting possible low-grade malignancy.  - CXR left-hemithorax unremarkable, follow-up as an outpatient.  Skin pressure injury, POA - Stage 2 per documentation  Peripheral vascular disease - Follow-up as an outpatient  DVT prophylaxis: Heparin Code Status: DNR Family Communication: None available  Status is: INPT  Dispo: The patient is from: Home              Anticipated d/c is to: TBD              Anticipated d/c date is: 66-96h              Family communication: Step-daughter POA - understands mental status may be new baseline - currently working with case management on possible placement given patient's ongoing needs. Patient currently NOT medically stable for discharge due to ongoing mental status aberrancy - unable to make decisions currently due to acute medical condition as above with limited capacity to make decisions.  Consultants:   None  Procedures:   None indicated  Antimicrobials:  None indicated  Subjective: No acute issues or events overnight per nursing staff, somewhat more calm today,  alert to person only; able to orient she is not at home but otherwise very limited verbal interaction.  Objective: Vitals:   01/09/20 0802 01/09/20 1212 01/09/20 2044  01/10/20 0604  BP:  119/71 116/72 109/66  Pulse:  82 96 (!) 104  Resp:  16  20  Temp:  97.6 F (36.4 C) 98.1 F (36.7 C) 98.4 F (36.9 C)  TempSrc:  Oral Oral Oral  SpO2: 93% 94% 95% (!) 88%  Weight:    53.1 kg  Height:        Intake/Output Summary (Last 24 hours) at 01/10/2020 0753 Last data filed at 01/10/2020 0600 Gross per 24 hour  Intake 490 ml  Output 0 ml  Net 490 ml   Filed Weights   01/08/20 0500 01/09/20 0618 01/10/20 0604  Weight: 54 kg 53.2 kg 53.1 kg    Examination:  General:  Pleasantly resting in bed, No acute distress.  Alert to person only HEENT:  Normocephalic atraumatic.  Sclerae nonicteric, noninjected.  Extraocular movements intact bilaterally. Neck:  Without mass or deformity.  Trachea is midline. Lungs:  Clear to auscultate bilaterally without rhonchi, wheeze, or rales. Heart:  Regular rate and rhythm.  Without murmurs, rubs, or gallops. Abdomen:  Soft, nontender, nondistended.  Without guarding or rebound. Extremities: Without cyanosis, clubbing, edema, or obvious deformity. Vascular:  Dorsalis pedis and posterior tibial pulses palpable bilaterally. Skin:  Warm and dry, no erythema, no ulcerations.   Data Reviewed: I have personally reviewed following labs and imaging studies  CBC: Recent Labs  Lab 01/04/20 1220 01/05/20 0524 01/07/20 0555  WBC 16.6* 10.4 16.5*  HGB 10.1* 9.1* 9.4*  HCT 31.0* 28.4* 28.8*  MCV 110.7* 113.1* 109.5*  PLT 200 184 287   Basic Metabolic Panel: Recent Labs  Lab 01/04/20 1220 01/04/20 1605 01/05/20 0524 01/06/20 0614 01/07/20 0555 01/09/20 0536  NA 136  --  136 134* 134* 139  K 3.3*  --  4.2 4.5 4.4 3.8  CL 96*  --  99 98 100 102  CO2 28  --  26 29 30 28   GLUCOSE 143*  --  141* 136* 95 100*  BUN 26*  --  26* 30* 37* 30*  CREATININE 1.00  --  0.78 0.98 1.05* 0.90  CALCIUM 8.4*  --  8.0* 8.3* 8.3* 8.6*  MG  --  1.8 2.3  --   --   --   PHOS  --  3.8  --   --   --   --    GFR: Estimated Creatinine  Clearance: 48.8 mL/min (by C-G formula based on SCr of 0.9 mg/dL). Liver Function Tests: Recent Labs  Lab 01/04/20 1220 01/05/20 0524 01/06/20 0614 01/07/20 0555 01/09/20 0536  AST 135* 110* 99* 196* 111*  ALT 50* 44 48* 70* 77*  ALKPHOS 152* 133* 131* 125 132*  BILITOT 3.9* 3.4* 2.8* 2.6* 2.9*  PROT 5.7* 5.3* 5.4* 5.2* 5.2*  ALBUMIN 2.3* 2.2* 2.3* 2.2* 2.3*   Recent Labs  Lab 01/04/20 1343  LIPASE 52*   Recent Labs  Lab 01/04/20 1605 01/08/20 1513 01/09/20 0536  AMMONIA 47* 50* 55*   Coagulation Profile: Recent Labs  Lab 01/04/20 1343 01/05/20 0524 01/06/20 0614 01/07/20 0555 01/08/20 0530  INR 1.3* 1.4* 1.4* 1.4* 1.3*   Cardiac Enzymes: No results for input(s): CKTOTAL, CKMB, CKMBINDEX, TROPONINI in the last 168 hours. BNP (last 3 results) No results for input(s): PROBNP in the last 8760 hours. HbA1C: No results for input(s): HGBA1C in  the last 72 hours. CBG: Recent Labs  Lab 01/04/20 1249  GLUCAP 126*   Lipid Profile: No results for input(s): CHOL, HDL, LDLCALC, TRIG, CHOLHDL, LDLDIRECT in the last 72 hours. Thyroid Function Tests: No results for input(s): TSH, T4TOTAL, FREET4, T3FREE, THYROIDAB in the last 72 hours. Anemia Panel: No results for input(s): VITAMINB12, FOLATE, FERRITIN, TIBC, IRON, RETICCTPCT in the last 72 hours. Sepsis Labs: Recent Labs  Lab 01/04/20 1341 01/04/20 1605  LATICACIDVEN 2.3* 1.1    Recent Results (from the past 240 hour(s))  SARS Coronavirus 2 by RT PCR (hospital order, performed in [facility] hospital lab) Nasopharyngeal Nasopharyngeal Swab     Status: None   Collection Time: 01/04/20  4:05 PM   Specimen: Nasopharyngeal Swab  Result Value Ref Range Status   SARS Coronavirus 2 NEGATIVE NEGATIVE Final    Comment: (NOTE) SARS-CoV-2 target nucleic acids are NOT DETECTED.  The SARS-CoV-2 RNA is generally detectable in upper and lower respiratory specimens during the acute phase of infection. The  lowest concentration of SARS-CoV-2 viral copies this assay can detect is 250 copies / mL. A negative result does not preclude SARS-CoV-2 infection and should not be used as the sole basis for treatment or other patient management decisions.  A negative result may occur with improper specimen collection / handling, submission of specimen other than nasopharyngeal swab, presence of viral mutation(s) within the areas targeted by this assay, and inadequate number of viral copies (<250 copies / mL). A negative result must be combined with clinical observations, patient history, and epidemiological information.  Fact Sheet for Patients:   StrictlyIdeas.no  Fact Sheet for Healthcare Providers: BankingDealers.co.za  This test is not yet approved or  cleared by the [place] FDA and has been authorized for detection and/or diagnosis of SARS-CoV-2 by FDA under an Emergency Use Authorization (EUA).  This EUA will remain in effect (meaning this test can be used) for the duration of the COVID-19 declaration under Section 564(b)(1) of the Act, 21 U.S.C. section 360bbb-3(b)(1), unless the authorization is terminated or revoked sooner.  Performed at [facility], [place] [address] [place], [place] [address]   Urine culture     Status: Abnormal   Collection Time: 01/05/20  6:00 AM   Specimen: Urine, Random  Result Value Ref Range Status   Specimen Description   Final    URINE, RANDOM Performed at [place] [address], [place], [place] [address]    Special Requests   Final    NONE Performed at [facility], [place] [address], [place], [place] [address]    Culture (A)  Final    30,000 COLONIES/mL MULTIPLE SPECIES PRESENT, SUGGEST RECOLLECTION   Report Status 01/06/2020 FINAL  Final  Gram stain     Status: None   Collection Time: 01/05/20 12:21 PM   Specimen: Fluid  Result Value Ref  Range Status   Specimen Description FLUID  Final   Special Requests NONE  Final   Gram Stain   Final    WBC PRESENT, PREDOMINANTLY PMN NO ORGANISMS SEEN CYTOSPIN SMEAR Performed at [facility], [place] [address], [place], [place] [address]    Report Status 01/05/2020 FINAL  Final  Culture, body fluid-bottle     Status: None   Collection Time: 01/05/20 12:21 PM   Specimen: Fluid  Result Value Ref Range Status   Specimen Description FLUID  Final   Special Requests NONE  Final   Culture  Final    NO GROWTH 5 DAYS Performed at [facility], [place] [address] [place], [place] [address]    Report Status 01/10/2020 FINAL  Final    Radiology Studies: No results found.  Scheduled Meds: . busPIRone  10 mg Oral TID  . escitalopram  10 mg Oral Daily  . folic acid  1 mg Oral Daily  . furosemide  20 mg Oral Daily  . guaiFENesin  600 mg Oral BID  . heparin  5,000 Units Subcutaneous Q8H  . lactulose  20 g Oral BID  . mouth rinse  15 mL Mouth Rinse BID  . multivitamin with minerals  1 tablet Oral Daily  . pantoprazole  40 mg Oral QAC breakfast  . predniSONE  40 mg Oral Q breakfast  . spironolactone  12.5 mg Oral Daily  . thiamine  100 mg Oral Daily   Or  . thiamine  100 mg Intravenous Daily   Continuous Infusions:   LOS: 6 days   Time spent: 30 min  Little Ishikawa, DO [facility]  If 7PM-7AM, please contact night-coverage [email or web address]  01/10/2020, 7:53 AM

## 2020-01-10 NOTE — Progress Notes (Signed)
  Speech Language Pathology Treatment: Dysphagia  Patient Details Name: [name] MRN: [id number] DOB: [birth year] Today's Date: 01/10/2020 Time: 1330-1340 SLP Time Calculation (min) (ACUTE ONLY): 10 min  Assessment / Plan / Recommendation Clinical Impression  No real improvements in willingness to eat despite encouragement.  Pt continues with oral holding of POs prior to initiating a swallow, related primarily to impaired mental status, but there are no s/s of aspiration; she appears to be protecting her airway. She consumed a few sips of her drink as described; declined solid foods.  Recommend continuing dysphagia 3 diet as ordered yesterday.  No further acute care SLP needs are identified- primary obstacle is motivation.  Our service will sign off.   HPI HPI: 69 yo female adm to [facility] with fatigue, nausea/vomiting - found to have alcoholic hepatitis with moderate ascities.  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  Per Dr Everett Graff note, pt continues with lateral tongue lesion from his observation on 01/07/2020.      SLP Plan  All goals met       Recommendations  Diet recommendations: Dysphagia 3 (mechanical soft);Thin liquid Liquids provided via: Straw;Cup Medication Administration: Whole meds with puree Supervision: Staff to assist with self feeding Compensations: Slow rate;Small sips/bites Postural Changes and/or Swallow Maneuvers: Seated upright 90 degrees;Upright 30-60 min after meal                Oral Care Recommendations: Oral care BID Follow up Recommendations: None SLP Visit Diagnosis: Dysphagia, unspecified (R13.10) Plan: All goals met       GO               Zaden Sako L. Tivis Ringer, [place] CCC/SLP Acute Rehabilitation Services Office number [phone] Pager [phone]  Juan Quam Laurice 01/10/2020, 2:05 PM

## 2020-01-11 NOTE — TOC Progression Note (Signed)
Transition of Care [facility]) - Progression Note    Patient Details  Name: [name] MRN: [id number] Date of Birth: 1951-04-20  Transition of [facility]) CM/SW Contact  Shade Flood, LCSW Phone Number: 01/11/2020, 1:28 PM  Clinical Narrative:     TOC following. Spoke with pt's daughter, [name], yesterday to follow up on SNF bed offers. [name] selected [facility] and Janie at [employer] was updated.   Per MD, pt may be stable for dc 7/4 or 7/5. TOC started insurance auth today. Per Narda Rutherford, they are taking admissions over the holiday weekend so if pt is stable Sunday and has auth, she can transfer. Pt will need updated COVID test prior to dc.  TOC will follow.  Expected Discharge Plan: [place] Barriers to Discharge: Continued Medical Work up  Expected Discharge Plan and Services Expected Discharge Plan: [place]   Discharge Planning Services: CM Consult Post Acute Care Choice: [place] Living arrangements for the past 2 months: Single Family Home                 DME Arranged: N/A DME Agency: NA       HH Arranged: NA HH Agency: NA         Social Determinants of Health (SDOH) Interventions    Readmission Risk Interventions Readmission Risk Prevention Plan 01/08/2020  Transportation Screening Complete  HRI or Home Care Consult Complete  Social Work Consult for [place] Planning/Counseling Complete  Palliative Care Screening Not Applicable  Medication Review Press photographer) Complete  Some recent data might be hidden

## 2020-01-11 NOTE — Progress Notes (Signed)
Mobility therapy assisted pt to chair.  Once they left, I returned to follow up with pt who immediately had me return her to bed.  I attempted to educate pt on benefits of being out of bed, especially for meals (dinner to be delivered ~11min of this writing).  Pt. States that she "does not care."  Pt frequently removing purewick and nasal canula.  NT attempts to redirect and reposition so that nasal canula are in/on and purewick is in place.

## 2020-01-11 NOTE — Progress Notes (Signed)
Occupational Therapy Treatment Patient Details Name: [name] MRN: [id number] DOB: 1951/01/31 Today's Date: 01/11/2020    History of present illness 69 year old woman PMH alcoholism with daily use, COPD, lung CA, L reverse TSA 2/202,  presented with increasing fatigue, nausea, vomiting and abdominal pain.  Drinks 2-3 bottles of wine daily.  Admitted for alcoholic hepatitis with moderate ascites, possible tongue CA   OT comments  Patient continues to be confused and unmotivated but did demonstrate improved ability to participate in self care tasks, bed mobility and transfers requiring min assist for supine to sit and min assist for standing and stand pivot to recliner. Patient somewhat resistance of therapist assistance but eventually agreeable. Patient's confusion limiting patient's participation. Cont POC    Follow Up Recommendations  SNF;Supervision/Assistance - 24 hour    Equipment Recommendations  3 in 1 bedside commode    Recommendations for Other Services      Precautions / Restrictions Precautions Precautions: Fall Precaution Comments: monitor O2 sats Restrictions Weight Bearing Restrictions: No       Mobility Bed Mobility Overal bed mobility: Needs Assistance Bed Mobility: Supine to Sit     Supine to sit: Min assist Sit to supine: Min assist   General bed mobility comments: assist for trunk negotiation.  Transfers Overall transfer level: Needs assistance   Transfers: Sit to/from Stand;Stand Pivot Transfers Sit to Stand: Min assist Stand pivot transfers: Min assist       General transfer comment: Pt unable to power up from bed or recliner - needing assistance from therapist. Patient unstable and min assist to stand pivot to recliner. Stood again from recliner to place pad underneath patinet.    Balance Overall balance assessment: Needs assistance Sitting-balance support: Feet supported Sitting balance-Leahy Scale: Fair     Standing balance support:  Bilateral upper extremity supported Standing balance-Leahy Scale: Poor                             ADL either performed or assessed with clinical judgement   ADL     Eating/Feeding Details (indicate cue type and reason): Patient able to drink out of cup today without assistance. Grooming: Sitting;Set up;Wash/dry hands Grooming Details (indicate cue type and reason): Patient washed hands sitting in chair with set up. Poor quality howeer.                                     Vision       Perception     Praxis      Cognition Arousal/Alertness: Awake/alert Behavior During Therapy: Restless Overall Cognitive Status: No family/caregiver present to determine baseline cognitive functioning Area of Impairment: Orientation;Attention;Memory;Following commands;Safety/judgement;Awareness;Problem solving                 Orientation Level: Disoriented to;Place;Time;Situation Current Attention Level: Focused Memory: Decreased recall of precautions;Decreased short-term memory   Safety/Judgement: Decreased awareness of safety;Decreased awareness of deficits Awareness: Intellectual Problem Solving: Slow processing;Decreased initiation;Difficulty sequencing;Requires verbal cues;Requires tactile cues General Comments: Continues to be confused but able to follow more commands with increased time.        Exercises     Shoulder Instructions       General Comments      Pertinent Vitals/ Pain       Pain Assessment: No/denies pain  Home Living  Prior Functioning/Environment              Frequency  Min 2X/week        Progress Toward Goals  OT Goals(current goals can now be found in the care plan section)  Progress towards OT goals: Progressing toward goals  Acute Rehab OT Goals Patient Stated Goal: none stated OT Goal Formulation: Patient unable to participate in goal setting Time  For Goal Achievement: 01/22/20  Plan Discharge plan remains appropriate    Co-evaluation                 AM-PAC OT "6 Clicks" Daily Activity     Outcome Measure   Help from another person eating meals?: A Little Help from another person taking care of personal grooming?: A Little Help from another person toileting, which includes using toliet, bedpan, or urinal?: Total Help from another person bathing (including washing, rinsing, drying)?: A Lot Help from another person to put on and taking off regular upper body clothing?: A Lot Help from another person to put on and taking off regular lower body clothing?: Total 6 Click Score: 12    End of Session    OT Visit Diagnosis: Unsteadiness on feet (R26.81);Other abnormalities of gait and mobility (R26.89);Muscle weakness (generalized) (M62.81);Other symptoms and signs involving cognitive function;Pain   Activity Tolerance Treatment limited secondary to agitation   Patient Left in bed;with call bell/phone within reach;with bed alarm set   Nurse Communication Precautions        Time: 7867-6720 OT Time Calculation (min): 17 min  Charges: OT General Charges $OT Visit: 1 Visit OT Treatments $Therapeutic Activity: 8-22 mins  Derl Barrow, OTR/L [place]  Office [phone] Pager: [place] 01/11/2020, 5:09 PM

## 2020-01-11 NOTE — Progress Notes (Signed)
Assumed care of patient from [place], [place]. Agree with previous assessment. Will continue to monitor.

## 2020-01-11 NOTE — Progress Notes (Signed)
PROGRESS NOTE    [name]  KNL:[id number] DOB: Dec 26, 1950 DOA: 01/04/2020 PCP: Wenda Low, MD   Brief Narrative:  69 year old woman PMH alcoholism with daily use, presented with increasing fatigue, nausea, vomiting and abdominal pain.  Drinks 2-3 bottles of wine daily.  Admitted for alcoholic hepatitis with moderate ascites -mental status continues to wax and wane, continues altered from apparent baseline likely requiring placement for safety and ongoing care.  Assessment & Plan:   Principal Problem:   Alcoholic hepatitis with ascites Active Problems:   Cigarette smoker   Elevated LFTs   Hypokalemia   Macrocytic anemia   Hypotension   COPD exacerbation (HCC)   Alcoholism (HCC)   Tobacco use disorder   Pressure injury of skin   Acute metabolic encephalopathy   Alcoholic hepatitis with moderate ascites, elevated LFTs, elevated INR, probable cirrhosis. Maddrey's Discriminant Function 9.4 - Right upper quadrant ultrasound suggested diffuse hepatocellular disease or possible cirrhosis, portal hypertension.  - Status post ultrasound guided paracentesis 2.4L clear yellow fluid 6/26, no evidence of SBP.  - AST, ALT appear to be minimally improving, unclear baseline -previous within normal limits a year ago - Follow-up with gastroenterology in 4 weeks to further evaluate cirrhosis. Continue low-dose diuretics per gastroenterology as blood pressure can tolerate.  Alcohol use/abuse disorder, alcoholism with daily drinking 2-3 bottles of wine per day, with acute withdrawal (resolved) - Patient remains confused, alert to person only. - No other features of withdrawal at this time.   - Family updated, likely discharge to skilled nursing facility for ongoing care given ongoing mental status depression from previous baseline  Acute metabolic encephalopathy vs Wernicke's encephalopathy - Suspect related to alcohol use and abuse, ammonia minimally elevated - Empiric lactulose to  cover abnormal ammonia elevation (unclear baseline)  Macrocytic anemia, stable likely 2/2 above - Anemia stable. No further evaluation suggested. - Continue multivitamin, B12, folate  Borderline hypotension likely secondary to hypoalbuminemia - Resolved  COPD exacerbation complicated by ongoing smoking.   - PMH right lung cancer. - Continues to improve. Change to oral steroids tomorrow. Continue bronchodilators.  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  Needs outpatient follow-up with ENT.  12 mm nodule in the left lower lung is similar to previous PET-CT scan. (from CT 08/2018).  - Per PET-CT report, suggesting possible low-grade malignancy.  - CXR left-hemithorax unremarkable, follow-up as an outpatient.  Skin pressure injury, POA - Stage 2 per previous documentation  Peripheral vascular disease - Follow-up as an outpatient  DVT prophylaxis: Heparin Code Status: DNR Family Communication: None available  Status is: INPT  Dispo: The patient is from: Home              Anticipated d/c is to: TBD              Anticipated d/c date is: 52-72h              Family communication: Step-daughter POA - understands mental status may be new baseline - currently working with case management on possible placement given patient's ongoing needs. Patient currently NOT medically stable for discharge due to ongoing mental status aberrancy - unable to make decisions currently due to acute medical condition as above with limited capacity to make decisions.  Likely disposition to SNF in the next 48 hours would be reasonable given improvement in patient's mental status and complaints.  Consultants:  None  Procedures:   None indicated  Antimicrobials:  None indicated  Subjective: No acute issues or events overnight per nursing staff, somewhat more  calm today, alert to person and place -she is able to orient to hospital today although does not recall which hospital.  Remainder of subjective examination limited due to patient's agitation.  Objective: Vitals:   01/10/20 1537 01/10/20 2112 01/11/20 0500 01/11/20 0627  BP:  97/60  107/64  Pulse:  94  93  Resp:  19  18  Temp:  98 F (36.7 C)  97.9 F (36.6 C)  TempSrc:  Oral  Oral  SpO2: 91% 93%  92%  Weight:   53.5 kg   Height:        Intake/Output Summary (Last 24 hours) at 01/11/2020 0742 Last data filed at 01/11/2020 0735 Gross per 24 hour  Intake 128 ml  Output 200 ml  Net -72 ml   Filed Weights   01/09/20 0618 01/10/20 0604 01/11/20 0500  Weight: 53.2 kg 53.1 kg 53.5 kg    Examination:  General:  Pleasantly resting in bed, No acute distress.  Alert to person only HEENT:  Normocephalic atraumatic.  Sclerae nonicteric, noninjected.  Extraocular movements intact bilaterally. Neck:  Without mass or deformity.  Trachea is midline. Lungs:  Clear to auscultate bilaterally without rhonchi, wheeze, or rales. Heart:  Regular rate and rhythm.  Without murmurs, rubs, or gallops. Abdomen:  Soft, nontender, nondistended.  Without guarding or rebound. Extremities: Without cyanosis, clubbing, edema, or obvious deformity. Vascular:  Dorsalis pedis and posterior tibial pulses palpable bilaterally. Skin:  Warm and dry, no erythema, no ulcerations.   Data Reviewed: I have personally reviewed following labs and imaging studies  CBC: Recent Labs  Lab 01/04/20 1220 01/05/20 0524 01/07/20 0555  WBC 16.6* 10.4 16.5*  HGB 10.1* 9.1* 9.4*  HCT 31.0* 28.4* 28.8*  MCV 110.7* 113.1* 109.5*  PLT 200 184 161   Basic Metabolic Panel: Recent Labs  Lab 01/04/20 1220 01/04/20 1605 01/05/20 0524 01/06/20 0614 01/07/20 0555 01/09/20 0536  NA 136  --  136 134* 134* 139  K 3.3*  --  4.2 4.5 4.4 3.8  CL 96*  --  99 98 100 102  CO2 28  --  26 29 30 28   GLUCOSE 143*  --  141* 136* 95  100*  BUN 26*  --  26* 30* 37* 30*  CREATININE 1.00  --  0.78 0.98 1.05* 0.90  CALCIUM 8.4*  --  8.0* 8.3* 8.3* 8.6*  MG  --  1.8 2.3  --   --   --   PHOS  --  3.8  --   --   --   --    GFR: Estimated Creatinine Clearance: 49.1 mL/min (by C-G formula based on SCr of 0.9 mg/dL). Liver Function Tests: Recent Labs  Lab 01/04/20 1220 01/05/20 0524 01/06/20 0614 01/07/20 0555 01/09/20 0536  AST 135* 110* 99* 196* 111*  ALT 50* 44 48* 70* 77*  ALKPHOS 152* 133* 131* 125 132*  BILITOT 3.9* 3.4* 2.8* 2.6* 2.9*  PROT 5.7* 5.3* 5.4* 5.2* 5.2*  ALBUMIN 2.3* 2.2* 2.3* 2.2* 2.3*   Recent Labs  Lab 01/04/20 1343  LIPASE 52*   Recent Labs  Lab 01/04/20 1605 01/08/20 1513 01/09/20 0536  AMMONIA 47* 50* 55*   Coagulation Profile: Recent Labs  Lab 01/04/20 1343 01/05/20 0524 01/06/20 0614 01/07/20 0555 01/08/20 0530  INR 1.3* 1.4* 1.4* 1.4* 1.3*   Cardiac Enzymes: No  results for input(s): CKTOTAL, CKMB, CKMBINDEX, TROPONINI in the last 168 hours. BNP (last 3 results) No results for input(s): PROBNP in the last 8760 hours. HbA1C: No results for input(s): HGBA1C in the last 72 hours. CBG: Recent Labs  Lab 01/04/20 1249  GLUCAP 126*   Lipid Profile: No results for input(s): CHOL, HDL, LDLCALC, TRIG, CHOLHDL, LDLDIRECT in the last 72 hours. Thyroid Function Tests: No results for input(s): TSH, T4TOTAL, FREET4, T3FREE, THYROIDAB in the last 72 hours. Anemia Panel: No results for input(s): VITAMINB12, FOLATE, FERRITIN, TIBC, IRON, RETICCTPCT in the last 72 hours. Sepsis Labs: Recent Labs  Lab 01/04/20 1341 01/04/20 1605  LATICACIDVEN 2.3* 1.1    Recent Results (from the past 240 hour(s))  SARS Coronavirus 2 by RT PCR (hospital order, performed in [facility] hospital lab) Nasopharyngeal Nasopharyngeal Swab     Status: None   Collection Time: 01/04/20  4:05 PM   Specimen: Nasopharyngeal Swab  Result Value Ref Range Status   SARS Coronavirus 2 NEGATIVE NEGATIVE  Final    Comment: (NOTE) SARS-CoV-2 target nucleic acids are NOT DETECTED.  The SARS-CoV-2 RNA is generally detectable in upper and lower respiratory specimens during the acute phase of infection. The lowest concentration of SARS-CoV-2 viral copies this assay can detect is 250 copies / mL. A negative result does not preclude SARS-CoV-2 infection and should not be used as the sole basis for treatment or other patient management decisions.  A negative result may occur with improper specimen collection / handling, submission of specimen other than nasopharyngeal swab, presence of viral mutation(s) within the areas targeted by this assay, and inadequate number of viral copies (<250 copies / mL). A negative result must be combined with clinical observations, patient history, and epidemiological information.  Fact Sheet for Patients:   StrictlyIdeas.no  Fact Sheet for Healthcare Providers: BankingDealers.co.za  This test is not yet approved or  cleared by the [place] FDA and has been authorized for detection and/or diagnosis of SARS-CoV-2 by FDA under an Emergency Use Authorization (EUA).  This EUA will remain in effect (meaning this test can be used) for the duration of the COVID-19 declaration under Section 564(b)(1) of the Act, 21 U.S.C. section 360bbb-3(b)(1), unless the authorization is terminated or revoked sooner.  Performed at [facility], [place] [address] [place], [place] [address]   Urine culture     Status: Abnormal   Collection Time: 01/05/20  6:00 AM   Specimen: Urine, Random  Result Value Ref Range Status   Specimen Description   Final    URINE, RANDOM Performed at [place] [address] [place], [place] [address]    Special Requests   Final    NONE Performed at [facility], [place] [address], [place], [place] [address]    Culture (A)  Final    30,000  COLONIES/mL MULTIPLE SPECIES PRESENT, SUGGEST RECOLLECTION   Report Status 01/06/2020 FINAL  Final  Gram stain     Status: None   Collection Time: 01/05/20 12:21 PM   Specimen: Fluid  Result Value Ref Range Status   Specimen Description FLUID  Final   Special Requests NONE  Final   Gram Stain   Final    WBC PRESENT, PREDOMINANTLY PMN NO ORGANISMS SEEN CYTOSPIN SMEAR Performed at [facility], [place] [address] [place], [place] [address]    Report Status 01/05/2020 FINAL  Final  Culture, body fluid-bottle     Status: None   Collection  Time: 01/05/20 12:21 PM   Specimen: Fluid  Result Value Ref Range Status   Specimen Description FLUID  Final   Special Requests NONE  Final   Culture   Final    NO GROWTH 5 DAYS Performed at [facility], [address] [place], [place] [address]    Report Status 01/10/2020 FINAL  Final  Fungus Culture With Stain     Status: None (Preliminary result)   Collection Time: 01/05/20  1:00 PM   Specimen: Abdomen; Peritoneal Fluid  Result Value Ref Range Status   Fungus Stain Final report  Final    Comment: (NOTE) Performed At: [facility], [place] [id number] Rush Farmer MD [id number]    Fungus (Mycology) Culture PENDING  Incomplete   Fungal Source PERITONEAL  Final    Comment: Performed at [facility], [place] [address], [place], [place] [address]  Fungus Culture Result     Status: None   Collection Time: 01/05/20  1:00 PM  Result Value Ref Range Status   Result 1 Comment  Final    Comment: (NOTE) KOH/Calcofluor preparation:  no fungus observed. Performed At: [facility] [place], [place] [id number] Rush Farmer MD [id number]     Radiology Studies: No results found.  Scheduled Meds: . busPIRone  10 mg Oral TID  . escitalopram  10 mg Oral Daily  . folic acid  1 mg Oral Daily  . furosemide  20 mg Oral Daily  . guaiFENesin  600 mg Oral BID  .  heparin  5,000 Units Subcutaneous Q8H  . lactulose  20 g Oral BID  . mouth rinse  15 mL Mouth Rinse BID  . multivitamin with minerals  1 tablet Oral Daily  . pantoprazole  40 mg Oral QAC breakfast  . predniSONE  40 mg Oral Q breakfast  . spironolactone  12.5 mg Oral Daily  . thiamine  100 mg Oral Daily   Or  . thiamine  100 mg Intravenous Daily   Continuous Infusions:   LOS: 7 days   Time spent: 30 min  Little Ishikawa, DO [facility]  If 7PM-7AM, please contact night-coverage [email or web address]  01/11/2020, 7:42 AM

## 2020-01-12 NOTE — Plan of Care (Signed)
  Problem: Health Behavior/Discharge Planning: Goal: Ability to manage health-related needs will improve Outcome: Progressing   Problem: Clinical Measurements: Goal: Will remain free from infection Outcome: Progressing Goal: Diagnostic test results will improve Outcome: Progressing Goal: Respiratory complications will improve Outcome: Progressing Goal: Cardiovascular complication will be avoided Outcome: Progressing   Problem: Activity: Goal: Risk for activity intolerance will decrease Outcome: Progressing   Problem: Nutrition: Goal: Adequate nutrition will be maintained Outcome: Progressing   Problem: Coping: Goal: Level of anxiety will decrease Outcome: Progressing   Problem: Elimination: Goal: Will not experience complications related to bowel motility Outcome: Progressing Goal: Will not experience complications related to urinary retention Outcome: Progressing   Problem: Pain Managment: Goal: General experience of comfort will improve Outcome: Progressing   Problem: Safety: Goal: Ability to remain free from injury will improve Outcome: Progressing   Problem: Skin Integrity: Goal: Risk for impaired skin integrity will decrease Outcome: Progressing

## 2020-01-12 NOTE — TOC Progression Note (Signed)
Transition of Care [facility]) - Progression Note    Patient Details  Name: [name] MRN: [id number] Date of Birth: 01-02-1951  Transition of Care [facility]) CM/SW Contact  Lennart Pall, LCSW Phone Number: 01/12/2020, 3:43 PM  Clinical Narrative:   Insurance auth still pending at this time.  TOC will continue to monitor.    Expected Discharge Plan: [place] Barriers to Discharge: Continued Medical Work up  Expected Discharge Plan and Services Expected Discharge Plan: [place]   Discharge Planning Services: CM Consult Post Acute Care Choice: [place] Living arrangements for the past 2 months: Single Family Home                 DME Arranged: N/A DME Agency: NA       HH Arranged: NA HH Agency: NA         Social Determinants of Health (SDOH) Interventions    Readmission Risk Interventions Readmission Risk Prevention Plan 01/08/2020  Transportation Screening Complete  HRI or Home Care Consult Complete  Social Work Consult for [place] Planning/Counseling Complete  Palliative Care Screening Not Applicable  Medication Review [employer] photographer) Complete  Some recent data might be hidden

## 2020-01-12 NOTE — Progress Notes (Signed)
PROGRESS NOTE    MA MUNOZ  FTD:[id number] DOB: May 25, 1951 DOA: 01/04/2020 PCP: Wenda Low, MD   Brief Narrative:  69 year old woman PMH alcoholism with daily use, presented with increasing fatigue, nausea, vomiting and abdominal pain.  Drinks 2-3 bottles of wine daily.  Admitted for alcoholic hepatitis with moderate ascites -mental status continues to wax and wane, continues altered from apparent baseline likely requiring placement for safety and ongoing care.  Assessment & Plan:   Principal Problem:   Alcoholic hepatitis with ascites Active Problems:   Cigarette smoker   Elevated LFTs   Hypokalemia   Macrocytic anemia   Hypotension   COPD exacerbation (HCC)   Alcoholism (HCC)   Tobacco use disorder   Pressure injury of skin   Acute metabolic encephalopathy   Alcoholic hepatitis with moderate ascites, elevated LFTs, elevated INR, probable cirrhosis. Maddrey's Discriminant Function 9.4 - Right upper quadrant ultrasound suggested diffuse hepatocellular disease or possible cirrhosis, portal hypertension.  - Status post ultrasound guided paracentesis 2.4L clear yellow fluid 6/26, no evidence of SBP.  - AST, ALT appear to be minimally improving, unclear baseline -previous within normal limits a year ago - Follow-up with gastroenterology in 4 weeks to further evaluate cirrhosis. Continue low-dose diuretics per gastroenterology as blood pressure can tolerate.  Alcohol use/abuse disorder, alcoholism with daily drinking 2-3 bottles of wine per day, with acute withdrawal (resolved) - Patient remains confused, alert to person only. - No other features of withdrawal at this time.   - Family updated, likely discharge to skilled nursing facility for ongoing care given ongoing mental status depression from previous baseline  Acute metabolic encephalopathy vs Wernicke's encephalopathy - Patient's mental status is likely at her new baseline alert to person and place only.  - Suspect  related to alcohol use and abuse, ammonia minimally elevated - Empiric lactulose to cover abnormal ammonia elevation (unclear baseline)  Macrocytic anemia, stable likely 2/2 above - Anemia stable. No further evaluation suggested. - Continue multivitamin, B12, folate  Borderline hypotension likely secondary to hypoalbuminemia - Resolved  COPD exacerbation complicated by ongoing smoking.   - PMH right lung cancer. - Continues to improve. Change to oral steroids tomorrow. Continue bronchodilators.  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  Needs outpatient follow-up with ENT.  12 mm nodule in the left lower lung is similar to previous PET-CT scan. (from CT 08/2018).  - Per PET-CT report, suggesting possible low-grade malignancy.  - CXR left-hemithorax unremarkable, follow-up as an outpatient.  Skin pressure injury, POA - Stage 2 per previous documentation  Peripheral vascular disease - Follow-up as an outpatient  DVT prophylaxis: Heparin Code Status: DNR Family Communication: None available  Status is: INPT  Dispo: The patient is from: Home              Anticipated d/c is to: TBD              Anticipated d/c date is: 7/5-01/15/20              Family communication: Step-daughter POA - understands mental status may be new baseline - currently working with case management on possible placement given patient's ongoing needs.   Patient currently IS medically stable for discharge at this point given mental status appears to have stabilized over the past 48 hours, will need ongoing care at outside facility,  Consultants:   None  Procedures:  None indicated  Antimicrobials:  None indicated  Subjective: No acute issues or events overnight per nursing staff, somewhat more calm today, alert to person and place; she continues to be alert to  person place only.  Somewhat difficult interview, remainder of subjective examination limited due to patient's mental status  Objective: Vitals:   01/11/20 0627 01/11/20 1409 01/11/20 2033 01/12/20 0502  BP: 107/64 98/60 111/65 112/66  Pulse: 93 100 90 94  Resp: 18 17 20 20   Temp: 97.9 F (36.6 C) 98.3 F (36.8 C) (!) 97.5 F (36.4 C) 97.8 F (36.6 C)  TempSrc: Oral Oral Oral Oral  SpO2: 92% 90% 95% 95%  Weight:      Height:        Intake/Output Summary (Last 24 hours) at 01/12/2020 0820 Last data filed at 01/12/2020 0030 Gross per 24 hour  Intake 220 ml  Output --  Net 220 ml   Filed Weights   01/09/20 0618 01/10/20 0604 01/11/20 0500  Weight: 53.2 kg 53.1 kg 53.5 kg    Examination:  General:  Pleasantly resting in bed, No acute distress.  Alert to person and place only HEENT:  Normocephalic atraumatic.  Sclerae nonicteric, noninjected.  Extraocular movements intact bilaterally. Neck:  Without mass or deformity.  Trachea is midline. Lungs:  Clear to auscultate bilaterally without rhonchi, wheeze, or rales. Heart:  Regular rate and rhythm.  Without murmurs, rubs, or gallops. Abdomen:  Soft, nontender, nondistended.  Without guarding or rebound. Extremities: Without cyanosis, clubbing, edema, or obvious deformity. Vascular:  Dorsalis pedis and posterior tibial pulses palpable bilaterally. Skin:  Warm and dry, no erythema, no ulcerations.   Data Reviewed: I have personally reviewed following labs and imaging studies  CBC: Recent Labs  Lab 01/07/20 0555  WBC 16.5*  HGB 9.4*  HCT 28.8*  MCV 109.5*  PLT 622   Basic Metabolic Panel: Recent Labs  Lab 01/06/20 0614 01/07/20 0555 01/09/20 0536  NA 134* 134* 139  K 4.5 4.4 3.8  CL 98 100 102  CO2 29 30 28   GLUCOSE 136* 95 100*  BUN 30* 37* 30*  CREATININE 0.98 1.05* 0.90  CALCIUM 8.3* 8.3* 8.6*   GFR: Estimated Creatinine Clearance: 49.1 mL/min (by C-G formula based on SCr of 0.9 mg/dL). Liver Function  Tests: Recent Labs  Lab 01/06/20 0614 01/07/20 0555 01/09/20 0536  AST 99* 196* 111*  ALT 48* 70* 77*  ALKPHOS 131* 125 132*  BILITOT 2.8* 2.6* 2.9*  PROT 5.4* 5.2* 5.2*  ALBUMIN 2.3* 2.2* 2.3*   No results for input(s): LIPASE, AMYLASE in the last 168 hours. Recent Labs  Lab 01/08/20 1513 01/09/20 0536  AMMONIA 50* 55*   Coagulation Profile: Recent Labs  Lab 01/06/20 0614 01/07/20 0555 01/08/20 0530  INR 1.4* 1.4* 1.3*   Cardiac Enzymes: No results for input(s): CKTOTAL, CKMB, CKMBINDEX, TROPONINI in the last 168 hours. BNP (last 3 results) No results for input(s): PROBNP in the last 8760 hours. HbA1C: No results for input(s): HGBA1C in the last 72 hours. CBG: No results for input(s): GLUCAP in the last 168 hours. Lipid Profile: No results for input(s): CHOL, HDL, LDLCALC, TRIG, CHOLHDL, LDLDIRECT in the last 72 hours. Thyroid Function Tests: No results for input(s): TSH, T4TOTAL, FREET4, T3FREE, THYROIDAB in the last 72 hours. Anemia Panel: No results for input(s): VITAMINB12, FOLATE, FERRITIN, TIBC, IRON, RETICCTPCT in the last 72 hours. Sepsis Labs: No results for input(s): PROCALCITON, LATICACIDVEN in the last 168 hours.  Recent Results (from the past  240 hour(s))  SARS Coronavirus 2 by RT PCR (hospital order, performed in [facility] hospital lab) Nasopharyngeal Nasopharyngeal Swab     Status: None   Collection Time: 01/04/20  4:05 PM   Specimen: Nasopharyngeal Swab  Result Value Ref Range Status   SARS Coronavirus 2 NEGATIVE NEGATIVE Final    Comment: (NOTE) SARS-CoV-2 target nucleic acids are NOT DETECTED.  The SARS-CoV-2 RNA is generally detectable in upper and lower respiratory specimens during the acute phase of infection. The lowest concentration of SARS-CoV-2 viral copies this assay can detect is 250 copies / mL. A negative result does not preclude SARS-CoV-2 infection and should not be used as the sole basis for treatment or other patient  management decisions.  A negative result may occur with improper specimen collection / handling, submission of specimen other than nasopharyngeal swab, presence of viral mutation(s) within the areas targeted by this assay, and inadequate number of viral copies (<250 copies / mL). A negative result must be combined with clinical observations, patient history, and epidemiological information.  Fact Sheet for Patients:   StrictlyIdeas.no  Fact Sheet for Healthcare Providers: BankingDealers.co.za  This test is not yet approved or  cleared by the [place] FDA and has been authorized for detection and/or diagnosis of SARS-CoV-2 by FDA under an Emergency Use Authorization (EUA).  This EUA will remain in effect (meaning this test can be used) for the duration of the COVID-19 declaration under Section 564(b)(1) of the Act, 21 U.S.C. section 360bbb-3(b)(1), unless the authorization is terminated or revoked sooner.  Performed at [facility], [place] [address] [place], [place] [address]   Urine culture     Status: Abnormal   Collection Time: 01/05/20  6:00 AM   Specimen: Urine, Random  Result Value Ref Range Status   Specimen Description   Final    URINE, RANDOM Performed at [place] [address], [place], [place] [address]    Special Requests   Final    NONE Performed at [facility], [place] [address] [place], [place] [address]    Culture (A)  Final    30,000 COLONIES/mL MULTIPLE SPECIES PRESENT, SUGGEST RECOLLECTION   Report Status 01/06/2020 FINAL  Final  Gram stain     Status: None   Collection Time: 01/05/20 12:21 PM   Specimen: Fluid  Result Value Ref Range Status   Specimen Description FLUID  Final   Special Requests NONE  Final   Gram Stain   Final    WBC PRESENT, PREDOMINANTLY PMN NO ORGANISMS SEEN CYTOSPIN SMEAR Performed at [facility], [place]  [address], [place], [place] [address]    Report Status 01/05/2020 FINAL  Final  Culture, body fluid-bottle     Status: None   Collection Time: 01/05/20 12:21 PM   Specimen: Fluid  Result Value Ref Range Status   Specimen Description FLUID  Final   Special Requests NONE  Final   Culture   Final    NO GROWTH 5 DAYS Performed at [place] [address] [place],  [address]    Report Status 01/10/2020 FINAL  Final  Fungus Culture With Stain     Status: None (Preliminary result)   Collection Time: 01/05/20  1:00 PM   Specimen: Abdomen; Peritoneal Fluid  Result Value Ref Range Status   Fungus Stain Final report  Final    Comment: (NOTE) Performed At: [facility] [address] [place], [place] [id number] Rush Farmer  MD [id number]    Fungus (Mycology) Culture PENDING  Incomplete   Fungal Source PERITONEAL  Final    Comment: Performed at [facility], [place] [address], [place], [place] [address]  Fungus Culture Result     Status: None   Collection Time: 01/05/20  1:00 PM  Result Value Ref Range Status   Result 1 Comment  Final    Comment: (NOTE) KOH/Calcofluor preparation:  no fungus observed. Performed At: [facility] [place], [place] [id number] Rush Farmer MD RZ:[id number]     Radiology Studies: No results found.  Scheduled Meds: . busPIRone  10 mg Oral TID  . escitalopram  10 mg Oral Daily  . folic acid  1 mg Oral Daily  . furosemide  20 mg Oral Daily  . guaiFENesin  600 mg Oral BID  . heparin  5,000 Units Subcutaneous Q8H  . lactulose  20 g Oral BID  . mouth rinse  15 mL Mouth Rinse BID  . multivitamin with minerals  1 tablet Oral Daily  . pantoprazole  40 mg Oral QAC breakfast  . predniSONE  40 mg Oral Q breakfast  . spironolactone  12.5 mg Oral Daily  . thiamine  100 mg Oral Daily   Or  . thiamine  100 mg Intravenous Daily   Continuous Infusions:   LOS: 8 days   Time spent: 30  min  Little Ishikawa, DO [facility]  If 7PM-7AM, please contact night-coverage [email or web address]  01/12/2020, 8:20 AM

## 2020-01-13 NOTE — Progress Notes (Signed)
Patient continues to refuse medications.  This nurse explained medications and purpose/benefits and she still refuses.  She did allow care this am (bath) and linen change from Tech.

## 2020-01-13 NOTE — Progress Notes (Signed)
Patient alert to self. Patient refused all AM medications. Educated, patient states "she understands" but said she would "spit out medications" if medication admin attempted, no meds given at this time. Refused all assistance by this RN. MD aware.

## 2020-01-13 NOTE — Plan of Care (Signed)
  Problem: Health Behavior/Discharge Planning: Goal: Ability to manage health-related needs will improve Outcome: Progressing   Problem: Clinical Measurements: Goal: Will remain free from infection Outcome: Progressing Goal: Diagnostic test results will improve Outcome: Progressing Goal: Respiratory complications will improve Outcome: Progressing Goal: Cardiovascular complication will be avoided Outcome: Progressing   Problem: Activity: Goal: Risk for activity intolerance will decrease Outcome: Progressing   Problem: Nutrition: Goal: Adequate nutrition will be maintained Outcome: Progressing   Problem: Coping: Goal: Level of anxiety will decrease Outcome: Progressing   Problem: Elimination: Goal: Will not experience complications related to bowel motility Outcome: Progressing Goal: Will not experience complications related to urinary retention Outcome: Progressing   Problem: Pain Managment: Goal: General experience of comfort will improve Outcome: Progressing   Problem: Safety: Goal: Ability to remain free from injury will improve Outcome: Progressing   Problem: Skin Integrity: Goal: Risk for impaired skin integrity will decrease Outcome: Progressing

## 2020-01-13 NOTE — Progress Notes (Signed)
PROGRESS NOTE    [name]  XQJ:[id number] DOB: 1950-07-22 DOA: 01/04/2020 PCP: Wenda Low, MD   Brief Narrative:  69 year old woman PMH alcoholism with daily use, presented with increasing fatigue, nausea, vomiting and abdominal pain.  Drinks 2-3 bottles of wine daily.  Admitted for alcoholic hepatitis with moderate ascites -mental status continues to wax and wane, continues altered from apparent baseline likely requiring placement for safety and ongoing care.  Assessment & Plan:   Principal Problem:   Alcoholic hepatitis with ascites Active Problems:   Cigarette smoker   Elevated LFTs   Hypokalemia   Macrocytic anemia   Hypotension   COPD exacerbation (HCC)   Alcoholism (HCC)   Tobacco use disorder   Pressure injury of skin   Acute metabolic encephalopathy   Acute metabolic encephalopathy vs Wernicke's encephalopathy - Patient's mental status is likely at her new baseline alert to person and place only -continues to be somewhat noncompliant with interview - Suspect related to alcohol use and abuse, ammonia minimally elevated - Empiric lactulose to cover abnormal ammonia elevation (unclear baseline)  Alcoholic hepatitis with moderate ascites, elevated LFTs, elevated INR, probable cirrhosis. Maddrey's Discriminant Function 9.4 - Right upper quadrant ultrasound suggested diffuse hepatocellular disease or possible cirrhosis, portal hypertension.  - Status post ultrasound guided paracentesis 2.4L clear yellow fluid 6/26, no evidence of SBP.  - AST, ALT appear to be minimally improving, unclear baseline -previous within normal limits a year ago - Follow-up with gastroenterology in 4 weeks to further evaluate cirrhosis. Continue low-dose diuretics per gastroenterology as blood pressure can tolerate.  Alcohol use/abuse disorder, alcoholism with daily drinking 2-3 bottles of wine per day, with acute withdrawal (resolved) - Patient remains confused, alert to person only. - No  other features of withdrawal at this time.   - Family updated, likely discharge to skilled nursing facility for ongoing care given ongoing mental status depression from previous baseline  Macrocytic anemia, stable likely 2/2 above - Anemia stable. No further evaluation suggested. - Continue multivitamin, B12, folate  Borderline hypotension likely secondary to hypoalbuminemia - Resolved  COPD exacerbation complicated by ongoing smoking.   - PMH right lung cancer. - Continues to improve. Change to oral steroids tomorrow. Continue bronchodilators.  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  Needs outpatient follow-up with ENT.  12 mm nodule in the left lower lung is similar to previous PET-CT scan. (from CT 08/2018).  - Per PET-CT report, suggesting possible low-grade malignancy.  - CXR left-hemithorax unremarkable, follow-up as an outpatient.  Skin pressure injury, POA - Stage 2 per previous documentation  Peripheral vascular disease - Follow-up as an outpatient  DVT prophylaxis: Heparin Code Status: DNR Family Communication: None available  Status is: INPT  Dispo: The patient is from: Home              Anticipated d/c is to: TBD              Anticipated d/c date is: 7/5-01/15/20              Family communication: Step-daughter POA - understands mental status may be new baseline - currently working with case management on possible placement given patient's ongoing needs.   Patient currently IS medically stable for discharge at this point given mental status appears to have stabilized over the past 48 hours, will need ongoing care at outside facility,  Consultants:   None  Procedures:   None indicated  Antimicrobials:  None indicated  Subjective: No acute issues or events overnight per nursing staff, somewhat more calm today,  alert to person and place only, has difficulty orienting to month/time/president -continues to be a difficult interview, denies nausea vomiting diarrhea constipation headache fevers or chills.  Objective: Vitals:   01/12/20 1255 01/12/20 2041 01/13/20 0514 01/13/20 0517  BP: 107/65 114/70 106/72   Pulse: 92 90 88   Resp: 20 18 18    Temp: 97.7 F (36.5 C) 98.2 F (36.8 C) 98.4 F (36.9 C)   TempSrc: Oral Oral Oral   SpO2: 92% 92% 96%   Weight:    52 kg  Height:        Intake/Output Summary (Last 24 hours) at 01/13/2020 0747 Last data filed at 01/12/2020 1748 Gross per 24 hour  Intake 310 ml  Output --  Net 310 ml   Filed Weights   01/10/20 0604 01/11/20 0500 01/13/20 0517  Weight: 53.1 kg 53.5 kg 52 kg    Examination:  General:  Pleasantly resting in bed, No acute distress.  Alert to person and place only HEENT:  Normocephalic atraumatic.  Sclerae nonicteric, noninjected.  Extraocular movements intact bilaterally. Neck:  Without mass or deformity.  Trachea is midline. Lungs:  Clear to auscultate bilaterally without rhonchi, wheeze, or rales. Heart:  Regular rate and rhythm.  Without murmurs, rubs, or gallops. Abdomen:  Soft, nontender, nondistended.  Without guarding or rebound. Extremities: Without cyanosis, clubbing, edema, or obvious deformity. Vascular:  Dorsalis pedis and posterior tibial pulses palpable bilaterally. Skin:  Warm and dry, no erythema, no ulcerations.   Data Reviewed: I have personally reviewed following labs and imaging studies  CBC: Recent Labs  Lab 01/07/20 0555  WBC 16.5*  HGB 9.4*  HCT 28.8*  MCV 109.5*  PLT 989   Basic Metabolic Panel: Recent Labs  Lab 01/07/20 0555 01/09/20 0536  NA 134* 139  K 4.4 3.8  CL 100 102  CO2 30 28  GLUCOSE 95 100*  BUN 37* 30*  CREATININE 1.05* 0.90  CALCIUM 8.3* 8.6*   GFR: Estimated Creatinine Clearance: 47.7 mL/min (by C-G formula based on SCr of 0.9 mg/dL). Liver Function Tests: Recent Labs   Lab 01/07/20 0555 01/09/20 0536  AST 196* 111*  ALT 70* 77*  ALKPHOS 125 132*  BILITOT 2.6* 2.9*  PROT 5.2* 5.2*  ALBUMIN 2.2* 2.3*   No results for input(s): LIPASE, AMYLASE in the last 168 hours. Recent Labs  Lab 01/08/20 1513 01/09/20 0536  AMMONIA 50* 55*   Coagulation Profile: Recent Labs  Lab 01/07/20 0555 01/08/20 0530  INR 1.4* 1.3*   Cardiac Enzymes: No results for input(s): CKTOTAL, CKMB, CKMBINDEX, TROPONINI in the last 168 hours. BNP (last 3 results) No results for input(s): PROBNP in the last 8760 hours. HbA1C: No results for input(s): HGBA1C in the last 72 hours. CBG: No results for input(s): GLUCAP in the last 168 hours. Lipid Profile: No results for input(s): CHOL, HDL, LDLCALC, TRIG, CHOLHDL, LDLDIRECT in the last 72 hours. Thyroid Function Tests: No results for input(s): TSH, T4TOTAL, FREET4, T3FREE, THYROIDAB in the last 72 hours. Anemia Panel: No results for input(s): VITAMINB12, FOLATE, FERRITIN, TIBC, IRON, RETICCTPCT in the last 72 hours. Sepsis Labs: No results for input(s): PROCALCITON, LATICACIDVEN in the last 168 hours.  Recent Results (from the past 240 hour(s))  SARS Coronavirus 2 by RT PCR (hospital order, performed in [facility] hospital lab) Nasopharyngeal  Nasopharyngeal Swab     Status: None   Collection Time: 01/04/20  4:05 PM   Specimen: Nasopharyngeal Swab  Result Value Ref Range Status   SARS Coronavirus 2 NEGATIVE NEGATIVE Final    Comment: (NOTE) SARS-CoV-2 target nucleic acids are NOT DETECTED.  The SARS-CoV-2 RNA is generally detectable in upper and lower respiratory specimens during the acute phase of infection. The lowest concentration of SARS-CoV-2 viral copies this assay can detect is 250 copies / mL. A negative result does not preclude SARS-CoV-2 infection and should not be used as the sole basis for treatment or other patient management decisions.  A negative result may occur with improper specimen collection /  handling, submission of specimen other than nasopharyngeal swab, presence of viral mutation(s) within the areas targeted by this assay, and inadequate number of viral copies (<250 copies / mL). A negative result must be combined with clinical observations, patient history, and epidemiological information.  Fact Sheet for Patients:   StrictlyIdeas.no  Fact Sheet for Healthcare Providers: BankingDealers.co.za  This test is not yet approved or  cleared by the [place] FDA and has been authorized for detection and/or diagnosis of SARS-CoV-2 by FDA under an Emergency Use Authorization (EUA).  This EUA will remain in effect (meaning this test can be used) for the duration of the COVID-19 declaration under Section 564(b)(1) of the Act, 21 U.S.C. section 360bbb-3(b)(1), unless the authorization is terminated or revoked sooner.  Performed at [facility], [place] [address] [place], [place] [address]   Urine culture     Status: Abnormal   Collection Time: 01/05/20  6:00 AM   Specimen: Urine, Random  Result Value Ref Range Status   Specimen Description   Final    URINE, RANDOM Performed at [place] [address], [place], [place] [address]    Special Requests   Final    NONE Performed at [facility], [place] [address], [place], [place] [address]    Culture (A)  Final    30,000 COLONIES/mL MULTIPLE SPECIES PRESENT, SUGGEST RECOLLECTION   Report Status 01/06/2020 FINAL  Final  Gram stain     Status: None   Collection Time: 01/05/20 12:21 PM   Specimen: Fluid  Result Value Ref Range Status   Specimen Description FLUID  Final   Special Requests NONE  Final   Gram Stain   Final    WBC PRESENT, PREDOMINANTLY PMN NO ORGANISMS SEEN CYTOSPIN SMEAR Performed at [facility], [place] [address], [place], [place] [address]    Report Status 01/05/2020 FINAL  Final  Culture, body  fluid-bottle     Status: None   Collection Time: 01/05/20 12:21 PM   Specimen: Fluid  Result Value Ref Range Status   Specimen Description FLUID  Final   Special Requests NONE  Final   Culture   Final    NO GROWTH 5 DAYS Performed at [place] [address] [place], [place] [address]    Report Status 01/10/2020 FINAL  Final  Fungus Culture With Stain     Status: None (Preliminary result)   Collection Time: 01/05/20  1:00 PM   Specimen: Abdomen; Peritoneal Fluid  Result Value Ref Range Status   Fungus Stain Final report  Final    Comment: (NOTE) Performed At: [facility] [place], [place] [id number] Rush Farmer MD QM:[id number]    Fungus (Mycology) Culture PENDING  Incomplete   Fungal Source PERITONEAL  Final  Comment: Performed at [facility], [place] [address] [place], [place] [address]  Fungus Culture Result     Status: None   Collection Time: 01/05/20  1:00 PM  Result Value Ref Range Status   Result 1 Comment  Final    Comment: (NOTE) KOH/Calcofluor preparation:  no fungus observed. Performed At: [facility] [place], [place] [id number] Rush Farmer MD OI:[id number]     Radiology Studies: No results found.  Scheduled Meds: . busPIRone  10 mg Oral TID  . escitalopram  10 mg Oral Daily  . folic acid  1 mg Oral Daily  . furosemide  20 mg Oral Daily  . guaiFENesin  600 mg Oral BID  . heparin  5,000 Units Subcutaneous Q8H  . lactulose  20 g Oral BID  . mouth rinse  15 mL Mouth Rinse BID  . multivitamin with minerals  1 tablet Oral Daily  . pantoprazole  40 mg Oral QAC breakfast  . predniSONE  40 mg Oral Q breakfast  . spironolactone  12.5 mg Oral Daily  . thiamine  100 mg Oral Daily   Or  . thiamine  100 mg Intravenous Daily   Continuous Infusions:   LOS: 9 days   Time spent: 30 min  Little Ishikawa, DO [facility]  If 7PM-7AM, please contact  night-coverage [email or web address]  01/13/2020, 7:47 AM

## 2020-01-13 NOTE — Progress Notes (Signed)
Patient did agree to take medications, this nurse explained purpose of each medication however she then refused.

## 2020-01-14 MED ORDER — LACTULOSE 10 GM/15ML PO SOLN: 20.0000 g | Freq: Two times a day (BID) | ORAL | 0 refills | Status: DC

## 2020-01-14 MED ORDER — FOLIC ACID 1 MG PO TABS: 1.0000 mg | ORAL_TABLET | Freq: Every day | ORAL | 0 refills | Status: DC

## 2020-01-14 MED ORDER — ADULT MULTIVITAMIN W/MINERALS CH: 1.0000 | ORAL_TABLET | Freq: Every day | ORAL | 0 refills | Status: DC

## 2020-01-14 MED ORDER — PREDNISONE 10 MG PO TABS
ORAL_TABLET | ORAL | 0 refills | Status: DC
Start: 2020-01-14 — End: 2020-02-05

## 2020-01-14 MED ORDER — FUROSEMIDE 20 MG PO TABS: 20.0000 mg | ORAL_TABLET | Freq: Every day | ORAL | 0 refills | Status: DC

## 2020-01-14 MED ORDER — THIAMINE HCL 100 MG PO TABS: 100.0000 mg | ORAL_TABLET | Freq: Every day | ORAL | 0 refills | Status: DC

## 2020-01-14 MED ORDER — SPIRONOLACTONE 25 MG PO TABS: 12.5000 mg | ORAL_TABLET | Freq: Every day | ORAL | 0 refills | Status: DC

## 2020-01-14 MED ORDER — SENNOSIDES-DOCUSATE SODIUM 8.6-50 MG PO TABS: 1.0000 | ORAL_TABLET | Freq: Every evening | ORAL | 0 refills | Status: DC | PRN

## 2020-01-14 NOTE — Care Management Important Message (Signed)
Important Message  Patient Details IM Letter presented to the Patient Name: [name] MRN: [id number] Date of Birth: [birth year]   Medicare Important Message Given:  Yes     Kerin Salen 01/14/2020, 11:13 AM

## 2020-01-14 NOTE — Discharge Summary (Signed)
Physician Discharge Summary  [name] NAT:[id number] DOB: 12/15/1950 DOA: 01/04/2020  PCP: Wenda Low, MD  Admit date: 01/04/2020 Discharge date: 01/14/2020  Admitted From: Home Disposition:  SNF  Recommendations for Outpatient Follow-up:  1. Follow up with PCP in 1-2 weeks 2. Please obtain BMP/CBC in one week  Discharge Condition: Guarded  CODE STATUS: DNR  Diet recommendation: As tolerated  Brief/Interim Summary: 69 year old woman PMH alcoholism with daily use, presented with increasing fatigue, nausea, vomiting and abdominal pain. Drinks 2-3 bottles of wine daily. Admitted for alcoholic hepatitis with moderate ascites -mental status continues to wax and wane, continues altered from apparent baseline likely requiring placement for safety and ongoing care  Patient admitted as above with acute intractable abdominal pain nausea vomiting and fatigue.  Patient's mental status continued to wax and wane during hospitalization likely in the setting of alcohol withdrawals.  Discussed case with patient's daughter who prior to admission indicates patient was living on her own at home alone and while likely not eating well and drinking copious amounts of alcohol upwards of 2 bottles of wine per day, was able to take care of her own ADLs.  Unfortunately patient's mental status now requires around-the-clock care, she has ongoing ambulatory dysfunction requiring close monitoring, high risk for falling and is otherwise unstable for discharge home.  After discussion with family Case management physical therapy and nutrition we all agreed that patient would benefit from SNF placement for ongoing nutrition, physical therapy and close monitoring.  Patient's mental status continues to be somewhat depressed from previous baseline, unclear if patient's mental status will improve on current regimen given concern for Warnicke's encephalopathy.  Family aware, outpatient follow-up with PCP as scheduled for further  evaluation and treatment per their expertise.Marland Kitchen  Discharge Diagnoses:  Principal Problem:   Alcoholic hepatitis with ascites Active Problems:   Cigarette smoker   Elevated LFTs   Hypokalemia   Macrocytic anemia   Hypotension   COPD exacerbation (HCC)   Alcoholism (HCC)   Tobacco use disorder   Pressure injury of skin   Acute metabolic encephalopathy    Discharge Instructions  Discharge Instructions    Call MD for:  difficulty breathing, headache or visual disturbances   Complete by: As directed    Call MD for:  extreme fatigue   Complete by: As directed    Call MD for:  hives   Complete by: As directed    Call MD for:  persistant dizziness or light-headedness   Complete by: As directed    Call MD for:  persistant nausea and vomiting   Complete by: As directed    Call MD for:  severe uncontrolled pain   Complete by: As directed    Call MD for:  temperature >100.4   Complete by: As directed    Diet - low sodium heart healthy   Complete by: As directed    Increase activity slowly   Complete by: As directed    No wound care   Complete by: As directed      Allergies as of 01/14/2020      Reactions   Codeine Other (See Comments)   Shakes and tremors   Tape Other (See Comments)   Patient PREFERS paper tape      Medication List    STOP taking these medications   atorvastatin 10 MG tablet Commonly known as: LIPITOR   clonazePAM 0.5 MG tablet Commonly known as: KLONOPIN   hydrocortisone-pramoxine 2.5-1 % rectal cream Commonly known as: [facility]  magnesium oxide 400 (241.3 Mg) MG tablet Commonly known as: MAG-OX   potassium chloride 20 MEQ/15ML (10%) Soln   primidone 50 MG tablet Commonly known as: MYSOLINE   traMADol 50 MG tablet Commonly known as: ULTRAM     TAKE these medications   busPIRone 10 MG tablet Commonly known as: BUSPAR Take 1 tablet (10 mg total) by mouth 3 (three) times daily. What changed:   when to take this  reasons to take  this   escitalopram 10 MG tablet Commonly known as: LEXAPRO Take 1 tablet (10 mg total) by mouth daily.   folic acid 1 MG tablet Commonly known as: FOLVITE Take 1 tablet (1 mg total) by mouth daily.   furosemide 20 MG tablet Commonly known as: LASIX Take 1 tablet (20 mg total) by mouth daily.   lactulose 10 GM/15ML solution Commonly known as: CHRONULAC Take 30 mLs (20 g total) by mouth 2 (two) times daily.   multivitamin with minerals Tabs tablet Take 1 tablet by mouth daily.   predniSONE 10 MG tablet Commonly known as: DELTASONE Take 4 tablets (40 mg total) by mouth daily for 3 days, THEN 3 tablets (30 mg total) daily for 3 days, THEN 2 tablets (20 mg total) daily for 3 days, THEN 1 tablet (10 mg total) daily for 3 days. Start taking on: January 14, 2020   ProAir HFA 108 (90 Base) MCG/ACT inhaler Generic drug: albuterol Inhale 2 puffs into the lungs every 4 (four) hours as needed for wheezing or shortness of breath.   senna-docusate 8.6-50 MG tablet Commonly known as: Senokot-S Take 1 tablet by mouth at bedtime as needed for mild constipation.   spironolactone 25 MG tablet Commonly known as: ALDACTONE Take 0.5 tablets (12.5 mg total) by mouth daily.   thiamine 100 MG tablet Take 1 tablet (100 mg total) by mouth daily.       Contact information for follow-up providers    Brahmbhatt, Parag, MD. Schedule an appointment as soon as possible for a visit in 4 week(s).   Specialty: Gastroenterology Why: Cirrhosis of the liver Contact information: [place] [address] [phone]            Contact information for after-discharge care    Destination    [facility] .   Service: Skilled Nursing Contact information: [place] [phone]                 Allergies  Allergen Reactions  . Codeine Other (See Comments)    Shakes and tremors  . Tape Other  (See Comments)    Patient PREFERS paper tape    Consultations:  GI   Procedures/Studies: DG Chest 2 View  Result Date: 01/04/2020 CLINICAL DATA:  Weakness. EXAM: CHEST - 2 VIEW COMPARISON:  August 26, 2018 FINDINGS: There is elevation of the right hemidiaphragm with subsequent stable right-sided volume loss. Mild, stable linear scarring is seen within the right upper lobe. There is no evidence of acute infiltrate. A very small, stable right pleural effusion is seen. No pneumothorax is identified. The heart size and mediastinal contours are within normal limits. Marked severity scoliosis of the thoracic spine is noted. IMPRESSION: 1. Stable right-sided volume loss with stable right upper lobe linear scarring. 2. Stable very small right pleural effusion. Electronically Signed   By: Virgina Norfolk M.D.   On: 01/04/2020 15:05   US Paracentesis  Result Date: 01/05/2020 INDICATION: History of  alcoholic cirrhosis. Ascites. Request for diagnostic and therapeutic paracentesis. EXAM: ULTRASOUND GUIDED RIGHT LOWER QUADRANT PARACENTESIS MEDICATIONS: None. COMPLICATIONS: None immediate. PROCEDURE: Informed written consent was obtained from the patient after a discussion of the risks, benefits and alternatives to treatment. A timeout was performed prior to the initiation of the procedure. Initial ultrasound scanning demonstrates a moderate amount of ascites within the right lower abdominal quadrant. The right lower abdomen was prepped and draped in the usual sterile fashion. 1% lidocaine was used for local anesthesia. Following this, a 19 gauge, 7-cm, Yueh catheter was introduced. An ultrasound image was saved for documentation purposes. The paracentesis was performed. The catheter was removed and a dressing was applied. The patient tolerated the procedure well without immediate post procedural complication. FINDINGS: A total of approximately 2.4 L of clear yellow fluid was removed. Samples were sent to the  laboratory as requested by the clinical team. IMPRESSION: Successful ultrasound-guided paracentesis yielding 2.4 liters of peritoneal fluid. Read by: Ascencion Dike PA-C Electronically Signed   By: Sandi Mariscal M.D.   On: 01/05/2020 12:52   US Abdomen Limited RUQ  Result Date: 01/04/2020 CLINICAL DATA:  Elevated liver function tests. EXAM: ULTRASOUND ABDOMEN LIMITED RIGHT UPPER QUADRANT COMPARISON:  August 13, 2018. FINDINGS: Gallbladder: No cholelithiasis is noted. No sonographic Murphy's sign is noted. Mild gallbladder wall thickening is noted most likely due to surrounding ascites. Common bile duct: Diameter: 3 mm which is within normal limits. Liver: 9 mm left hepatic cyst is noted which was present on prior CT scan. Increased echogenicity of hepatic parenchyma is noted suggesting diffuse hepatocellular disease. Bidirectional flow is noted in the portal vein suggesting portal hypertension. Other: Moderate ascites is noted. IMPRESSION: Increased echogenicity of hepatic parenchyma is noted suggesting diffuse hepatocellular disease or possibly cirrhosis. Bidirectional flow is noted in the portal vein suggesting portal hypertension. Moderate ascites is noted. No definite cholelithiasis is noted. Electronically Signed   By: Marijo Conception M.D.   On: 01/04/2020 16:27      Subjective: No acute issues or events overnight, patient alert to person and place only, no acute complaints at this time although review of systems quite limited given patient's mental status.   Discharge Exam: Vitals:   01/14/20 0622 01/14/20 1402  BP: 109/66 110/62  Pulse: 74 75  Resp: 20 18  Temp: 97.7 F (36.5 C) 97.6 F (36.4 C)  SpO2: 98% 99%   Vitals:   01/13/20 1247 01/13/20 2039 01/14/20 0622 01/14/20 1402  BP: 108/61 (!) 94/58 109/66 110/62  Pulse: 84 89 74 75  Resp: 18 18 20 18   Temp: (!) 97.5 F (36.4 C) 97.8 F (36.6 C) 97.7 F (36.5 C) 97.6 F (36.4 C)  TempSrc: Oral Oral Oral Oral  SpO2: 99% 92% 98%  99%  Weight:      Height:       General:  Pleasantly resting in bed, No acute distress.  Alert to person and place only HEENT:  Normocephalic atraumatic.  Sclerae nonicteric, noninjected.  Extraocular movements intact bilaterally. Neck:  Without mass or deformity.  Trachea is midline. Lungs:  Clear to auscultate bilaterally without rhonchi, wheeze, or rales. Heart:  Regular rate and rhythm.  Without murmurs, rubs, or gallops. Abdomen:  Soft, nontender, nondistended.  Without guarding or rebound. Extremities: Without cyanosis, clubbing, edema, or obvious deformity. Vascular:  Dorsalis pedis and posterior tibial pulses palpable bilaterally. Skin:  Warm and dry, no erythema, no ulcerations.   The results of significant diagnostics from this hospitalization (including  imaging, microbiology, ancillary and laboratory) are listed below for reference.     Microbiology: Recent Results (from the past 240 hour(s))  SARS Coronavirus 2 by RT PCR (hospital order, performed in [facility] hospital lab) Nasopharyngeal Nasopharyngeal Swab     Status: None   Collection Time: 01/04/20  4:05 PM   Specimen: Nasopharyngeal Swab  Result Value Ref Range Status   SARS Coronavirus 2 NEGATIVE NEGATIVE Final    Comment: (NOTE) SARS-CoV-2 target nucleic acids are NOT DETECTED.  The SARS-CoV-2 RNA is generally detectable in upper and lower respiratory specimens during the acute phase of infection. The lowest concentration of SARS-CoV-2 viral copies this assay can detect is 250 copies / mL. A negative result does not preclude SARS-CoV-2 infection and should not be used as the sole basis for treatment or other patient management decisions.  A negative result may occur with improper specimen collection / handling, submission of specimen other than nasopharyngeal swab, presence of viral mutation(s) within the areas targeted by this assay, and inadequate number of viral copies (<250 copies / mL). A negative result  must be combined with clinical observations, patient history, and epidemiological information.  Fact Sheet for Patients:   StrictlyIdeas.no  Fact Sheet for Healthcare Providers: BankingDealers.co.za  This test is not yet approved or  cleared by the [place] FDA and has been authorized for detection and/or diagnosis of SARS-CoV-2 by FDA under an Emergency Use Authorization (EUA).  This EUA will remain in effect (meaning this test can be used) for the duration of the COVID-19 declaration under Section 564(b)(1) of the Act, 21 U.S.C. section 360bbb-3(b)(1), unless the authorization is terminated or revoked sooner.  Performed at [facility], [place] [address], [place], [place] [address]   Urine culture     Status: Abnormal   Collection Time: 01/05/20  6:00 AM   Specimen: Urine, Random  Result Value Ref Range Status   Specimen Description   Final    URINE, RANDOM Performed at [place] [address], [place], [place] [address]    Special Requests   Final    NONE Performed at [facility], [place] [address] [place], [place] [address]    Culture (A)  Final    30,000 COLONIES/mL MULTIPLE SPECIES PRESENT, SUGGEST RECOLLECTION   Report Status 01/06/2020 FINAL  Final  Gram stain     Status: None   Collection Time: 01/05/20 12:21 PM   Specimen: Fluid  Result Value Ref Range Status   Specimen Description FLUID  Final   Special Requests NONE  Final   Gram Stain   Final    WBC PRESENT, PREDOMINANTLY PMN NO ORGANISMS SEEN CYTOSPIN SMEAR Performed at [facility], [place] [address], [place], [place] [address]    Report Status 01/05/2020 FINAL  Final  Culture, body fluid-bottle     Status: None   Collection Time: 01/05/20 12:21 PM   Specimen: Fluid  Result Value Ref Range Status   Specimen Description FLUID  Final   Special Requests NONE  Final   Culture   Final    NO  GROWTH 5 DAYS Performed at [place] [address] [place], [place] [address]    Report Status 01/10/2020 FINAL  Final  Fungus Culture With Stain     Status: None (Preliminary result)   Collection Time: 01/05/20  1:00 PM   Specimen: Abdomen; Peritoneal Fluid  Result Value Ref Range Status   Fungus Stain Final report  Final    Comment: (NOTE) Performed At: [facility] [place], [place] [id number] Rush Farmer MD [id number]    Fungus (Mycology) Culture PENDING  Incomplete   Fungal Source PERITONEAL  Final    Comment: Performed at [facility], [place] [address] [place], [place] [address]  Fungus Culture Result     Status: None   Collection Time: 01/05/20  1:00 PM  Result Value Ref Range Status   Result 1 Comment  Final    Comment: (NOTE) KOH/Calcofluor preparation:  no fungus observed. Performed At: [facility] [place], [place] [id number] Rush Farmer MD [id number]      Labs: BNP (last 3 results) Recent Labs    01/04/20 1220  BNP 032.1*   Basic Metabolic Panel: Recent Labs  Lab 01/09/20 0536  NA 139  K 3.8  CL 102  CO2 28  GLUCOSE 100*  BUN 30*  CREATININE 0.90  CALCIUM 8.6*   Liver Function Tests: Recent Labs  Lab 01/09/20 0536  AST 111*  ALT 77*  ALKPHOS 132*  BILITOT 2.9*  PROT 5.2*  ALBUMIN 2.3*   No results for input(s): LIPASE, AMYLASE in the last 168 hours. Recent Labs  Lab 01/08/20 1513 01/09/20 0536  AMMONIA 50* 55*   CBC: No results for input(s): WBC, NEUTROABS, HGB, HCT, MCV, PLT in the last 168 hours. Cardiac Enzymes: No results for input(s): CKTOTAL, CKMB, CKMBINDEX, TROPONINI in the last 168 hours. BNP: Invalid input(s): POCBNP CBG: No results for input(s): GLUCAP in the last 168 hours. D-Dimer No results for input(s): DDIMER in the last 72 hours. Hgb A1c No results for input(s): HGBA1C in the last 72 hours. Lipid Profile No results for  input(s): CHOL, HDL, LDLCALC, TRIG, CHOLHDL, LDLDIRECT in the last 72 hours. Thyroid function studies No results for input(s): TSH, T4TOTAL, T3FREE, THYROIDAB in the last 72 hours.  Invalid input(s): FREET3 Anemia work up No results for input(s): VITAMINB12, FOLATE, FERRITIN, TIBC, IRON, RETICCTPCT in the last 72 hours. Urinalysis    Component Value Date/Time   COLORURINE AMBER (A) 01/05/2020 0600   APPEARANCEUR CLEAR 01/05/2020 0600   LABSPEC 1.016 01/05/2020 0600   PHURINE 5.0 01/05/2020 0600   GLUCOSEU NEGATIVE 01/05/2020 0600   HGBUR NEGATIVE 01/05/2020 0600   BILIRUBINUR NEGATIVE 01/05/2020 0600   KETONESUR NEGATIVE 01/05/2020 0600   PROTEINUR NEGATIVE 01/05/2020 0600   NITRITE NEGATIVE 01/05/2020 0600   LEUKOCYTESUR NEGATIVE 01/05/2020 0600   Sepsis Labs Invalid input(s): PROCALCITONIN,  WBC,  LACTICIDVEN Microbiology Recent Results (from the past 240 hour(s))  SARS Coronavirus 2 by RT PCR (hospital order, performed in [place] hospital lab) Nasopharyngeal Nasopharyngeal Swab     Status: None   Collection Time: 01/04/20  4:05 PM   Specimen: Nasopharyngeal Swab  Result Value Ref Range Status   SARS Coronavirus 2 NEGATIVE NEGATIVE Final    Comment: (NOTE) SARS-CoV-2 target nucleic acids are NOT DETECTED.  The SARS-CoV-2 RNA is generally detectable in upper and lower respiratory specimens during the acute phase of infection. The lowest concentration of SARS-CoV-2 viral copies this assay can detect is 250 copies / mL. A negative result does not preclude SARS-CoV-2 infection and should not be used as the sole basis for treatment or other patient management decisions.  A negative result may occur with improper specimen collection / handling, submission of specimen other than nasopharyngeal swab, presence of viral mutation(s) within the areas targeted by this assay, and inadequate number of viral copies (<250 copies /  mL). A negative result must be combined with  clinical observations, patient history, and epidemiological information.  Fact Sheet for Patients:   StrictlyIdeas.no  Fact Sheet for Healthcare Providers: BankingDealers.co.za  This test is not yet approved or  cleared by the [place] FDA and has been authorized for detection and/or diagnosis of SARS-CoV-2 by FDA under an Emergency Use Authorization (EUA).  This EUA will remain in effect (meaning this test can be used) for the duration of the COVID-19 declaration under Section 564(b)(1) of the Act, 21 U.S.C. section 360bbb-3(b)(1), unless the authorization is terminated or revoked sooner.  Performed at [facility], [place] [address], [place], [place] [address]   Urine culture     Status: Abnormal   Collection Time: 01/05/20  6:00 AM   Specimen: Urine, Random  Result Value Ref Range Status   Specimen Description   Final    URINE, RANDOM Performed at [place] [address], [place], [place] [address]    Special Requests   Final    NONE Performed at [facility], [place] [address], [place], [place] [address]    Culture (A)  Final    30,000 COLONIES/mL MULTIPLE SPECIES PRESENT, SUGGEST RECOLLECTION   Report Status 01/06/2020 FINAL  Final  Gram stain     Status: None   Collection Time: 01/05/20 12:21 PM   Specimen: Fluid  Result Value Ref Range Status   Specimen Description FLUID  Final   Special Requests NONE  Final   Gram Stain   Final    WBC PRESENT, PREDOMINANTLY PMN NO ORGANISMS SEEN CYTOSPIN SMEAR Performed at [facility], [place] [address], [place], [place] [address]    Report Status 01/05/2020 FINAL  Final  Culture, body fluid-bottle     Status: None   Collection Time: 01/05/20 12:21 PM   Specimen: Fluid  Result Value Ref Range Status   Specimen Description FLUID  Final   Special Requests NONE  Final   Culture   Final    NO GROWTH 5  DAYS Performed at [place] [address], [place], [place] [address]    Report Status 01/10/2020 FINAL  Final  Fungus Culture With Stain     Status: None (Preliminary result)   Collection Time: 01/05/20  1:00 PM   Specimen: Abdomen; Peritoneal Fluid  Result Value Ref Range Status   Fungus Stain Final report  Final    Comment: (NOTE) Performed At: [facility] [place], [place] [id number] Rush Farmer MD [id number]    Fungus (Mycology) Culture PENDING  Incomplete   Fungal Source PERITONEAL  Final    Comment: Performed at [facility], [place] [address], [place], [place] [address]  Fungus Culture Result     Status: None   Collection Time: 01/05/20  1:00 PM  Result Value Ref Range Status   Result 1 Comment  Final    Comment: (NOTE) KOH/Calcofluor preparation:  no fungus observed. Performed At: [facility] [place], [place] [id number] Rush Farmer MD [id number]    Time coordinating discharge: Over 30 minutes  SIGNED:  Little Ishikawa, DO [facility] 01/14/2020, 2:21 PM Pager   If 7PM-7AM, please contact night-coverage [email or web address]

## 2020-01-14 NOTE — TOC Transition Note (Signed)
Transition of Care [facility]) - CM/SW Discharge Note   Patient Details  Name: [name] MRN: [id number] Date of Birth: [birth year]  Transition of Care [facility]) CM/SW Contact:  Shade Flood, LCSW Phone Number: 01/14/2020, 2:41 PM   Clinical Narrative:     Per MD pt medically stable for dc to SNF today if auth received. Insurance has authorized SNF and this LCSW spoke with Abigail Butts at [facility] to update. Per Abigail Butts, they can take pt today. Spoke with pt's step daughter, [name], who asked if pt is agreeable to go to SNF. Discussed with MD who states pt is not oriented fully to be capable of giving consent. Updated [name] who then made arrangements to meet with Abigail Butts at the SNF to do admissions paperwork at 1600.  Spoke with Abigail Butts again who states that number for report is [phone] pt going to room 3246.  Updated pt's RN. DC envelope will be placed at the desk and will ask RN to call [facility] after pt eats supper here at [place]'s request.  There are no other TOC needs identified for dc.  Final next level of care: Skilled Nursing Facility Barriers to Discharge: Barriers Resolved   Patient Goals and CMS Choice Patient states their goals for this hospitalization and ongoing recovery are:: per the daughter, patient will benefit from SNF CMS Medicare.gov Compare Post Acute Care list provided to:: Patient Represenative (must comment) Choice offered to / list presented to : Adult Children  Discharge Placement   Existing PASRR number confirmed : 01/08/20          Patient chooses bed at: [facility] Patient to be transferred to facility by: [place] Name of family member notified: [name] Patient and family notified of of transfer: 01/14/20  Discharge Plan and Services   Discharge Planning Services: CM Consult Post Acute Care Choice: [place]          DME Arranged: N/A DME Agency: NA       HH Arranged: NA [place] Agency: NA        Social Determinants of  Health ([place]) Interventions     Readmission Risk Interventions Readmission Risk Prevention Plan 01/08/2020  Transportation Screening Complete  HRI or Home Care Consult Complete  Social Work Consult for [place] Planning/Counseling Complete  Palliative Care Screening Not Applicable  Medication Review Press photographer) Complete  Some recent data might be hidden

## 2020-01-14 NOTE — Progress Notes (Signed)
PROGRESS NOTE    [name]  JME:[id number] DOB: 05-09-1951 DOA: 01/04/2020 PCP: Wenda Low, MD   Brief Narrative:  69 year old woman PMH alcoholism with daily use, presented with increasing fatigue, nausea, vomiting and abdominal pain.  Drinks 2-3 bottles of wine daily.  Admitted for alcoholic hepatitis with moderate ascites -mental status continues to wax and wane, continues altered from apparent baseline likely requiring placement for safety and ongoing care.  Assessment & Plan:   Principal Problem:   Alcoholic hepatitis with ascites Active Problems:   Cigarette smoker   Elevated LFTs   Hypokalemia   Macrocytic anemia   Hypotension   COPD exacerbation (HCC)   Alcoholism (HCC)   Tobacco use disorder   Pressure injury of skin   Acute metabolic encephalopathy   Acute metabolic encephalopathy vs Wernicke's encephalopathy - Patient's mental status is likely at her new baseline alert to person and place only -continues to be somewhat noncompliant with interview - Suspect related to alcohol use and abuse, ammonia minimally elevated - Empiric lactulose to cover abnormal ammonia elevation (unclear baseline)  Alcoholic hepatitis with moderate ascites, elevated LFTs, elevated INR, probable cirrhosis. Maddrey's Discriminant Function 9.4 - Right upper quadrant ultrasound suggested diffuse hepatocellular disease or possible cirrhosis, portal hypertension.  - Status post ultrasound guided paracentesis 2.4L clear yellow fluid 6/26, no evidence of SBP.  - AST, ALT appear to be minimally improving, unclear baseline -previous within normal limits a year ago - Follow-up with gastroenterology in 4 weeks to further evaluate cirrhosis. Continue low-dose diuretics per gastroenterology as blood pressure can tolerate.  Alcohol use/abuse disorder, alcoholism with daily drinking 2-3 bottles of wine per day, with acute withdrawal (resolved) - Patient remains confused, alert to person only. - No  other features of withdrawal at this time.   - Family updated, likely discharge to skilled nursing facility for ongoing care given ongoing mental status depression from previous baseline  Macrocytic anemia, stable likely 2/2 above - Anemia stable. No further evaluation suggested. - Continue multivitamin, B12, folate  Borderline hypotension likely secondary to hypoalbuminemia - Resolved  COPD exacerbation complicated by ongoing smoking.   - PMH right lung cancer. - Continues to improve. Change to oral steroids tomorrow. Continue bronchodilators.  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  Needs outpatient follow-up with ENT.  12 mm nodule in the left lower lung is similar to previous PET-CT scan. (from CT 08/2018).  - Per PET-CT report, suggesting possible low-grade malignancy.  - CXR left-hemithorax unremarkable, follow-up as an outpatient.  Skin pressure injury, POA - Stage 2 per previous documentation  Peripheral vascular disease - Follow-up as an outpatient  DVT prophylaxis: Heparin Code Status: DNR Family Communication: None available  Status is: INPT  Dispo: The patient is from: Home              Anticipated d/c is to: TBD              Anticipated d/c date is: 7/5-01/15/20              Family communication: Step-daughter POA - understands mental status may be new baseline - currently working with case management on possible placement given patient's ongoing needs.   Patient currently IS medically stable for discharge at this point given mental status appears to have stabilized over the past 48 hours, will need ongoing care at outside facility,  Consultants:   None  Procedures:   None indicated  Antimicrobials:  None indicated  Subjective: No acute issues or events overnight per nursing staff, continues to improve in mental  status, still only oriented to person and place but much more calm and reasonable with information gathering and cooperative with exam.  Objective: Vitals:   01/13/20 0517 01/13/20 1247 01/13/20 2039 01/14/20 0622  BP:  108/61 (!) 94/58 109/66  Pulse:  84 89 74  Resp:  18 18 20   Temp:  (!) 97.5 F (36.4 C) 97.8 F (36.6 C) 97.7 F (36.5 C)  TempSrc:  Oral Oral Oral  SpO2:  99% 92% 98%  Weight: 52 kg     Height:        Intake/Output Summary (Last 24 hours) at 01/14/2020 1221 Last data filed at 01/13/2020 1700 Gross per 24 hour  Intake 100 ml  Output --  Net 100 ml   Filed Weights   01/10/20 0604 01/11/20 0500 01/13/20 0517  Weight: 53.1 kg 53.5 kg 52 kg    Examination:  General:  Pleasantly resting in bed, No acute distress.  Alert to person and place only HEENT:  Normocephalic atraumatic.  Sclerae nonicteric, noninjected.  Extraocular movements intact bilaterally. Neck:  Without mass or deformity.  Trachea is midline. Lungs:  Clear to auscultate bilaterally without rhonchi, wheeze, or rales. Heart:  Regular rate and rhythm.  Without murmurs, rubs, or gallops. Abdomen:  Soft, nontender, nondistended.  Without guarding or rebound. Extremities: Without cyanosis, clubbing, edema, or obvious deformity. Vascular:  Dorsalis pedis and posterior tibial pulses palpable bilaterally. Skin:  Warm and dry, no erythema, no ulcerations.   Data Reviewed: I have personally reviewed following labs and imaging studies  CBC: No results for input(s): WBC, NEUTROABS, HGB, HCT, MCV, PLT in the last 168 hours. Basic Metabolic Panel: Recent Labs  Lab 01/09/20 0536  NA 139  K 3.8  CL 102  CO2 28  GLUCOSE 100*  BUN 30*  CREATININE 0.90  CALCIUM 8.6*   GFR: Estimated Creatinine Clearance: 47.7 mL/min (by C-G formula based on SCr of 0.9 mg/dL). Liver Function Tests: Recent Labs  Lab 01/09/20 0536  AST 111*  ALT 77*  ALKPHOS 132*  BILITOT 2.9*  PROT 5.2*  ALBUMIN 2.3*   No  results for input(s): LIPASE, AMYLASE in the last 168 hours. Recent Labs  Lab 01/08/20 1513 01/09/20 0536  AMMONIA 50* 55*   Coagulation Profile: Recent Labs  Lab 01/08/20 0530  INR 1.3*   Cardiac Enzymes: No results for input(s): CKTOTAL, CKMB, CKMBINDEX, TROPONINI in the last 168 hours. BNP (last 3 results) No results for input(s): PROBNP in the last 8760 hours. HbA1C: No results for input(s): HGBA1C in the last 72 hours. CBG: No results for input(s): GLUCAP in the last 168 hours. Lipid Profile: No results for input(s): CHOL, HDL, LDLCALC, TRIG, CHOLHDL, LDLDIRECT in the last 72 hours. Thyroid Function Tests: No results for input(s): TSH, T4TOTAL, FREET4, T3FREE, THYROIDAB in the last 72 hours. Anemia Panel: No results for input(s): VITAMINB12, FOLATE, FERRITIN, TIBC, IRON, RETICCTPCT in the last 72 hours. Sepsis Labs: No results for input(s): PROCALCITON, LATICACIDVEN in the last 168 hours.  Recent Results (from the past 240 hour(s))  SARS Coronavirus 2 by RT PCR (hospital order, performed in [facility] hospital lab) Nasopharyngeal Nasopharyngeal Swab     Status: None   Collection Time: 01/04/20  4:05 PM   Specimen: Nasopharyngeal Swab  Result Value Ref Range Status   SARS Coronavirus  2 NEGATIVE NEGATIVE Final    Comment: (NOTE) SARS-CoV-2 target nucleic acids are NOT DETECTED.  The SARS-CoV-2 RNA is generally detectable in upper and lower respiratory specimens during the acute phase of infection. The lowest concentration of SARS-CoV-2 viral copies this assay can detect is 250 copies / mL. A negative result does not preclude SARS-CoV-2 infection and should not be used as the sole basis for treatment or other patient management decisions.  A negative result may occur with improper specimen collection / handling, submission of specimen other than nasopharyngeal swab, presence of viral mutation(s) within the areas targeted by this assay, and inadequate number of viral  copies (<250 copies / mL). A negative result must be combined with clinical observations, patient history, and epidemiological information.  Fact Sheet for Patients:   StrictlyIdeas.no  Fact Sheet for Healthcare Providers: BankingDealers.co.za  This test is not yet approved or  cleared by the [place] FDA and has been authorized for detection and/or diagnosis of SARS-CoV-2 by FDA under an Emergency Use Authorization (EUA).  This EUA will remain in effect (meaning this test can be used) for the duration of the COVID-19 declaration under Section 564(b)(1) of the Act, 21 U.S.C. section 360bbb-3(b)(1), unless the authorization is terminated or revoked sooner.  Performed at [facility], [place] [address] [place], [place] [address]   Urine culture     Status: Abnormal   Collection Time: 01/05/20  6:00 AM   Specimen: Urine, Random  Result Value Ref Range Status   Specimen Description   Final    URINE, RANDOM Performed at [place] [address] [place], [place] [address]    Special Requests   Final    NONE Performed at [facility], [place] [address], [place], [place] [address]    Culture (A)  Final    30,000 COLONIES/mL MULTIPLE SPECIES PRESENT, SUGGEST RECOLLECTION   Report Status 01/06/2020 FINAL  Final  Gram stain     Status: None   Collection Time: 01/05/20 12:21 PM   Specimen: Fluid  Result Value Ref Range Status   Specimen Description FLUID  Final   Special Requests NONE  Final   Gram Stain   Final    WBC PRESENT, PREDOMINANTLY PMN NO ORGANISMS SEEN CYTOSPIN SMEAR Performed at [facility], [place] [address] [place], [place] [address]    Report Status 01/05/2020 FINAL  Final  Culture, body fluid-bottle     Status: None   Collection Time: 01/05/20 12:21 PM   Specimen: Fluid  Result Value Ref Range Status   Specimen Description FLUID  Final   Special  Requests NONE  Final   Culture   Final    NO GROWTH 5 DAYS Performed at [place] [address] [place], [place] [address]    Report Status 01/10/2020 FINAL  Final  Fungus Culture With Stain     Status: None (Preliminary result)   Collection Time: 01/05/20  1:00 PM   Specimen: Abdomen; Peritoneal Fluid  Result Value Ref Range Status   Fungus Stain Final report  Final    Comment: (NOTE) Performed At: [facility] [place], [place] [id number] Rush Farmer MD [id number]    Fungus (Mycology) Culture PENDING  Incomplete   Fungal Source PERITONEAL  Final    Comment: Performed at [facility], [place] [address] [place], [place] [address]  Fungus Culture Result     Status: None   Collection Time:  01/05/20  1:00 PM  Result Value Ref Range Status   Result 1 Comment  Final    Comment: (NOTE) KOH/Calcofluor preparation:  no fungus observed. Performed At: [facility] [place], [place] [id number] Rush Farmer MD [id number]     Radiology Studies: No results found.  Scheduled Meds: . busPIRone  10 mg Oral TID  . escitalopram  10 mg Oral Daily  . folic acid  1 mg Oral Daily  . furosemide  20 mg Oral Daily  . guaiFENesin  600 mg Oral BID  . heparin  5,000 Units Subcutaneous Q8H  . lactulose  20 g Oral BID  . mouth rinse  15 mL Mouth Rinse BID  . multivitamin with minerals  1 tablet Oral Daily  . pantoprazole  40 mg Oral QAC breakfast  . predniSONE  40 mg Oral Q breakfast  . spironolactone  12.5 mg Oral Daily  . thiamine  100 mg Oral Daily   Or  . thiamine  100 mg Intravenous Daily   Continuous Infusions:   LOS: 10 days   Time spent: 30 min  Little Ishikawa, DO [facility]  If 7PM-7AM, please contact night-coverage [email or web address]  01/14/2020, 12:21 PM

## 2020-01-14 NOTE — Progress Notes (Signed)
Patient refused 10:00 medications. Pt also refused bed pad, peri care and gown change when RN noticed they were soiled. Pt would allow top sheet and blanket to be changed. RN will try again to change linens and perform peri care.

## 2020-01-14 NOTE — Progress Notes (Signed)
Patient refused all 10pm medications

## 2020-01-14 NOTE — Progress Notes (Signed)
Patient refused 6am heparin

## 2020-01-25 ENCOUNTER — Other Ambulatory Visit: Payer: Self-pay

## 2020-01-25 ENCOUNTER — Encounter (HOSPITAL_COMMUNITY): Payer: Self-pay

## 2020-01-25 ENCOUNTER — Emergency Department (HOSPITAL_COMMUNITY): Payer: Medicare Other

## 2020-01-25 ENCOUNTER — Inpatient Hospital Stay (HOSPITAL_COMMUNITY)
Admission: EM | Admit: 2020-01-25 | Discharge: 2020-02-05 | DRG: 432 | Disposition: A | Discharge status: Hospice | Payer: Medicare Other | Attending: Internal Medicine | Admitting: Internal Medicine

## 2020-01-25 ENCOUNTER — Inpatient Hospital Stay (HOSPITAL_COMMUNITY): Payer: Medicare Other

## 2020-01-25 DIAGNOSIS — E872 Acidosis: Secondary | ICD-10-CM | POA: Diagnosis present

## 2020-01-25 DIAGNOSIS — R569 Unspecified convulsions: Secondary | ICD-10-CM | POA: Diagnosis not present

## 2020-01-25 DIAGNOSIS — R531 Weakness: Secondary | ICD-10-CM | POA: Diagnosis not present

## 2020-01-25 DIAGNOSIS — E869 Volume depletion, unspecified: Secondary | ICD-10-CM | POA: Diagnosis present

## 2020-01-25 DIAGNOSIS — J9811 Atelectasis: Secondary | ICD-10-CM | POA: Diagnosis not present

## 2020-01-25 DIAGNOSIS — G928 Other toxic encephalopathy: Secondary | ICD-10-CM | POA: Diagnosis present

## 2020-01-25 DIAGNOSIS — Z20822 Contact with and (suspected) exposure to covid-19: Secondary | ICD-10-CM | POA: Diagnosis present

## 2020-01-25 DIAGNOSIS — J9601 Acute respiratory failure with hypoxia: Secondary | ICD-10-CM | POA: Diagnosis not present

## 2020-01-25 DIAGNOSIS — Z7189 Other specified counseling: Secondary | ICD-10-CM

## 2020-01-25 DIAGNOSIS — F1020 Alcohol dependence, uncomplicated: Secondary | ICD-10-CM | POA: Diagnosis present

## 2020-01-25 DIAGNOSIS — Z885 Allergy status to narcotic agent status: Secondary | ICD-10-CM

## 2020-01-25 DIAGNOSIS — I10 Essential (primary) hypertension: Secondary | ICD-10-CM | POA: Diagnosis present

## 2020-01-25 DIAGNOSIS — C3490 Malignant neoplasm of unspecified part of unspecified bronchus or lung: Secondary | ICD-10-CM | POA: Diagnosis present

## 2020-01-25 DIAGNOSIS — Z79899 Other long term (current) drug therapy: Secondary | ICD-10-CM

## 2020-01-25 DIAGNOSIS — G92 Toxic encephalopathy: Secondary | ICD-10-CM | POA: Diagnosis present

## 2020-01-25 DIAGNOSIS — K7040 Alcoholic hepatic failure without coma: Secondary | ICD-10-CM | POA: Diagnosis present

## 2020-01-25 DIAGNOSIS — Z681 Body mass index (BMI) 19 or less, adult: Secondary | ICD-10-CM

## 2020-01-25 DIAGNOSIS — Z515 Encounter for palliative care: Secondary | ICD-10-CM | POA: Diagnosis present

## 2020-01-25 DIAGNOSIS — F17210 Nicotine dependence, cigarettes, uncomplicated: Secondary | ICD-10-CM | POA: Diagnosis present

## 2020-01-25 DIAGNOSIS — J449 Chronic obstructive pulmonary disease, unspecified: Secondary | ICD-10-CM | POA: Diagnosis present

## 2020-01-25 DIAGNOSIS — E440 Moderate protein-calorie malnutrition: Secondary | ICD-10-CM | POA: Diagnosis present

## 2020-01-25 DIAGNOSIS — G40901 Epilepsy, unspecified, not intractable, with status epilepticus: Secondary | ICD-10-CM | POA: Diagnosis not present

## 2020-01-25 DIAGNOSIS — N179 Acute kidney failure, unspecified: Secondary | ICD-10-CM | POA: Diagnosis present

## 2020-01-25 DIAGNOSIS — Z9221 Personal history of antineoplastic chemotherapy: Secondary | ICD-10-CM

## 2020-01-25 DIAGNOSIS — G40101 Localization-related (focal) (partial) symptomatic epilepsy and epileptic syndromes with simple partial seizures, not intractable, with status epilepticus: Secondary | ICD-10-CM | POA: Diagnosis present

## 2020-01-25 DIAGNOSIS — K7030 Alcoholic cirrhosis of liver without ascites: Secondary | ICD-10-CM | POA: Diagnosis not present

## 2020-01-25 DIAGNOSIS — R4182 Altered mental status, unspecified: Secondary | ICD-10-CM

## 2020-01-25 DIAGNOSIS — Z9114 Patient's other noncompliance with medication regimen: Secondary | ICD-10-CM

## 2020-01-25 DIAGNOSIS — F419 Anxiety disorder, unspecified: Secondary | ICD-10-CM | POA: Diagnosis present

## 2020-01-25 DIAGNOSIS — R0902 Hypoxemia: Secondary | ICD-10-CM | POA: Diagnosis not present

## 2020-01-25 DIAGNOSIS — E876 Hypokalemia: Secondary | ICD-10-CM | POA: Diagnosis present

## 2020-01-25 DIAGNOSIS — R7989 Other specified abnormal findings of blood chemistry: Secondary | ICD-10-CM | POA: Diagnosis not present

## 2020-01-25 DIAGNOSIS — G40909 Epilepsy, unspecified, not intractable, without status epilepticus: Secondary | ICD-10-CM | POA: Diagnosis not present

## 2020-01-25 DIAGNOSIS — K7011 Alcoholic hepatitis with ascites: Secondary | ICD-10-CM | POA: Diagnosis present

## 2020-01-25 DIAGNOSIS — R64 Cachexia: Secondary | ICD-10-CM | POA: Diagnosis present

## 2020-01-25 DIAGNOSIS — K5641 Fecal impaction: Secondary | ICD-10-CM | POA: Diagnosis present

## 2020-01-25 DIAGNOSIS — Z86010 Personal history of colonic polyps: Secondary | ICD-10-CM

## 2020-01-25 DIAGNOSIS — Z66 Do not resuscitate: Secondary | ICD-10-CM | POA: Diagnosis present

## 2020-01-25 DIAGNOSIS — G250 Essential tremor: Secondary | ICD-10-CM | POA: Diagnosis present

## 2020-01-25 DIAGNOSIS — Z96612 Presence of left artificial shoulder joint: Secondary | ICD-10-CM | POA: Diagnosis present

## 2020-01-25 DIAGNOSIS — I959 Hypotension, unspecified: Secondary | ICD-10-CM | POA: Diagnosis not present

## 2020-01-25 DIAGNOSIS — R945 Abnormal results of liver function studies: Secondary | ICD-10-CM

## 2020-01-25 DIAGNOSIS — D696 Thrombocytopenia, unspecified: Secondary | ICD-10-CM | POA: Diagnosis present

## 2020-01-25 DIAGNOSIS — D539 Nutritional anemia, unspecified: Secondary | ICD-10-CM | POA: Diagnosis present

## 2020-01-25 DIAGNOSIS — Z7952 Long term (current) use of systemic steroids: Secondary | ICD-10-CM

## 2020-01-25 DIAGNOSIS — Z902 Acquired absence of lung [part of]: Secondary | ICD-10-CM

## 2020-01-25 DIAGNOSIS — N390 Urinary tract infection, site not specified: Secondary | ICD-10-CM | POA: Diagnosis present

## 2020-01-25 DIAGNOSIS — Z8249 Family history of ischemic heart disease and other diseases of the circulatory system: Secondary | ICD-10-CM

## 2020-01-25 DIAGNOSIS — Z91048 Other nonmedicinal substance allergy status: Secondary | ICD-10-CM

## 2020-01-25 LAB — ETHANOL: Alcohol, Ethyl (B): <10 mg/dL (ref <10)

## 2020-01-25 LAB — COMPREHENSIVE METABOLIC PANEL
ALT: 45 U/L — ABNORMAL HIGH (ref 0–44)
AST: 75 U/L — ABNORMAL HIGH (ref 15–41)
Albumin: 2.8 g/dL — ABNORMAL LOW (ref 3.5–5.0)
Alkaline Phosphatase: 142 U/L — ABNORMAL HIGH (ref 38–126)
Anion gap: 14 (ref 5–15)
BUN: 25 mg/dL — ABNORMAL HIGH (ref 8–23)
CO2: 31 mmol/L (ref 22–32)
Calcium: 7.6 mg/dL — ABNORMAL LOW (ref 8.9–10.3)
Chloride: 92 mmol/L — ABNORMAL LOW (ref 98–111)
Creatinine, Ser: 1.34 mg/dL — ABNORMAL HIGH (ref 0.44–1.00)
GFR calc Af Amer: 46 mL/min — ABNORMAL LOW (ref >60)
GFR calc non Af Amer: 40 mL/min — ABNORMAL LOW (ref >60)
Glucose, Bld: 124 mg/dL — ABNORMAL HIGH (ref 70–99)
Potassium: 2.5 mmol/L — CL (ref 3.5–5.1)
Sodium: 137 mmol/L (ref 135–145)
Total Bilirubin: 6.8 mg/dL — ABNORMAL HIGH (ref 0.3–1.2)
Total Protein: 6 g/dL — ABNORMAL LOW (ref 6.5–8.1)

## 2020-01-25 LAB — CBC WITH DIFFERENTIAL/PLATELET
Abs Immature Granulocytes: 0.12 10*3/uL — ABNORMAL HIGH (ref 0.00–0.07)
Basophils Absolute: 0 10*3/uL (ref 0.0–0.1)
Basophils Relative: 0 %
Eosinophils Absolute: 0 10*3/uL (ref 0.0–0.5)
Eosinophils Relative: 0 %
HCT: 34.8 % — ABNORMAL LOW (ref 36.0–46.0)
Hemoglobin: 11.5 g/dL — ABNORMAL LOW (ref 12.0–15.0)
Immature Granulocytes: 1 %
Lymphocytes Relative: 10 %
Lymphs Abs: 1.2 10*3/uL (ref 0.7–4.0)
MCH: 34.8 pg — ABNORMAL HIGH (ref 26.0–34.0)
MCHC: 33 g/dL (ref 30.0–36.0)
MCV: 105.5 fL — ABNORMAL HIGH (ref 80.0–100.0)
Monocytes Absolute: 1 10*3/uL (ref 0.1–1.0)
Monocytes Relative: 8 %
Neutro Abs: 9.8 10*3/uL — ABNORMAL HIGH (ref 1.7–7.7)
Neutrophils Relative %: 81 %
Platelets: 146 10*3/uL — ABNORMAL LOW (ref 150–400)
RBC: 3.3 MIL/uL — ABNORMAL LOW (ref 3.87–5.11)
RDW: 13 % (ref 11.5–15.5)
WBC: 12.1 10*3/uL — ABNORMAL HIGH (ref 4.0–10.5)
nRBC: 0 % (ref 0.0–0.2)

## 2020-01-25 LAB — URINALYSIS, ROUTINE W REFLEX MICROSCOPIC
Glucose, UA: 50 mg/dL — AB
Ketones, ur: NEGATIVE mg/dL
Leukocytes,Ua: NEGATIVE
Nitrite: POSITIVE — AB
Protein, ur: 30 mg/dL — AB
Specific Gravity, Urine: 1.018 (ref 1.005–1.030)
pH: 5 (ref 5.0–8.0)

## 2020-01-25 LAB — SARS CORONAVIRUS 2 BY RT PCR (HOSPITAL ORDER, PERFORMED IN ~~LOC~~ HOSPITAL LAB): SARS Coronavirus 2: NEGATIVE

## 2020-01-25 LAB — PROTIME-INR
INR: 1.4 — ABNORMAL HIGH (ref 0.8–1.2)
Prothrombin Time: 16.5 seconds — ABNORMAL HIGH (ref 11.4–15.2)

## 2020-01-25 LAB — LACTIC ACID, PLASMA
Lactic Acid, Venous: 2.1 mmol/L (ref 0.5–1.9)
Lactic Acid, Venous: 1.9 mmol/L (ref 0.5–1.9)

## 2020-01-25 LAB — LIPASE, BLOOD: Lipase: 46 U/L (ref 11–51)

## 2020-01-25 LAB — AMMONIA: Ammonia: 52 umol/L — ABNORMAL HIGH (ref 9–35)

## 2020-01-25 MED ORDER — SODIUM CHLORIDE 0.9 % IV SOLN
2.0000 g | INTRAVENOUS | Status: DC
  Administered 2020-01-26 – 2020-01-28 (×3): 2 g via INTRAVENOUS
  Filled 2020-01-25 (×3): qty 2

## 2020-01-25 MED ORDER — SENNOSIDES-DOCUSATE SODIUM 8.6-50 MG PO TABS: 1.0000 | ORAL_TABLET | Freq: Every evening | ORAL | Status: DC | PRN

## 2020-01-25 MED ORDER — LACTULOSE 10 GM/15ML PO SOLN
20.0000 g | Freq: Two times a day (BID) | ORAL | Status: DC
  Administered 2020-01-26 – 2020-01-27 (×3): 20 g via ORAL
  Filled 2020-01-25 (×3): qty 30

## 2020-01-25 MED ORDER — ONDANSETRON HCL 4 MG/2ML IJ SOLN: 4.0000 mg | Freq: Four times a day (QID) | INTRAMUSCULAR | Status: DC | PRN

## 2020-01-25 MED ORDER — ALBUTEROL SULFATE (2.5 MG/3ML) 0.083% IN NEBU: 2.5000 mg | INHALATION_SOLUTION | RESPIRATORY_TRACT | Status: DC | PRN

## 2020-01-25 MED ORDER — SODIUM CHLORIDE 0.9 % IV SOLN
1.0000 g | Freq: Once | INTRAVENOUS | Status: AC
  Administered 2020-01-25: 1 g via INTRAVENOUS
  Filled 2020-01-25: qty 10

## 2020-01-25 MED ORDER — SODIUM CHLORIDE 0.9 % IV BOLUS
500.0000 mL | Freq: Once | INTRAVENOUS | Status: AC
  Administered 2020-01-25: 500 mL via INTRAVENOUS

## 2020-01-25 MED ORDER — NICOTINE 14 MG/24HR TD PT24
14.0000 mg | MEDICATED_PATCH | Freq: Every day | TRANSDERMAL | Status: DC
  Administered 2020-01-25 – 2020-01-29 (×5): 14 mg via TRANSDERMAL
  Filled 2020-01-25 (×5): qty 1

## 2020-01-25 MED ORDER — ENOXAPARIN SODIUM 30 MG/0.3ML ~~LOC~~ SOLN
30.0000 mg | SUBCUTANEOUS | Status: DC
  Administered 2020-01-25: 30 mg via SUBCUTANEOUS
  Filled 2020-01-25: qty 0.3

## 2020-01-25 MED ORDER — ALBUMIN HUMAN 25 % IV SOLN: 12.5000 g | Freq: Once | INTRAVENOUS | Status: DC

## 2020-01-25 MED ORDER — POTASSIUM CHLORIDE 10 MEQ/100ML IV SOLN
10.0000 meq | INTRAVENOUS | Status: AC
  Administered 2020-01-25 (×4): 10 meq via INTRAVENOUS
  Filled 2020-01-25 (×4): qty 100

## 2020-01-25 NOTE — ED Provider Notes (Signed)
[facility] DEPT Provider Note   CSN: [id number] Arrival date & time: 01/25/20  1035     History Chief Complaint  Patient presents with  . Altered Mental Status    [name] is a 69 y.o. female.  Level 5 caveat secondary to altered mental status.  she has a history of lung cancer, alcoholic cirrhosis who was recently discharged to SNF.  Reportedly since yesterday has been more weak than normal and more confused than normal and refusing her medications including lactulose.  Patient says no to most questions.  Oriented to self only.  The history is provided by the patient and the EMS personnel.  Altered Mental Status Presenting symptoms: disorientation and lethargy   Severity:  Moderate Most recent episode:  Yesterday Episode history:  Single Timing:  Constant Progression:  Unchanged Chronicity:  New Context: not taking medications as prescribed and nursing home resident   Associated symptoms: no abdominal pain, no fever, no headaches and no rash        Past Medical History:  Diagnosis Date  . Anemia   . Cancer [facility]) 2007   lung cancer, right, chemo done  . Complication of anesthesia 2007   slow to awaken  . History of blood transfusion 2010  . Hypertension   . Occasional tremors     Patient Active Problem List   Diagnosis Date Noted  . Acute metabolic encephalopathy 05/39/7673  . Alcoholism ([place]) 01/05/2020  . Tobacco use disorder 01/05/2020  . Pressure injury of skin 01/05/2020  . COPD exacerbation ([place]) 01/04/2020  . Alcoholic hepatitis with ascites 01/04/2020  . Malnutrition of moderate degree 08/14/2018  . 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 Alcohol dependence with withdrawal ([place]) 08/13/2018  . Elevated LFTs 08/13/2018  . Hypokalemia 08/13/2018  . Macrocytic anemia 08/13/2018  . Hypotension 08/13/2018  . Closed fracture of left humerus 08/13/2018  . Tachycardia   . Closed left humeral fracture 08/12/2018    . Solitary pulmonary nodule on lung CT 06/24/2018  . COPD pfts pending/ active smoker 06/24/2018  . Cigarette smoker 06/24/2018  . Tongue ulceration 06/14/2018  . Personal history of colonic polyps 01/29/2013  . Internal prolapsed hemorrhoids 01/29/2013    Past Surgical History:  Procedure Laterality Date  . COLONOSCOPY WITH PROPOFOL N/A 11/28/2012   Procedure: COLONOSCOPY WITH PROPOFOL;  Surgeon: Garlan Fair, MD;  Location: [facility];  Service: Endoscopy;  Laterality: N/A;  . REVERSE SHOULDER ARTHROPLASTY Left 08/14/2018   Procedure: REVERSE SHOULDER ARTHROPLASTY;  Surgeon: Netta Cedars, MD;  Location: [facility];  Service: Orthopedics;  Laterality: Left;  . right lung lobectomy  2007     OB History   No obstetric history on file.     Family History  Problem Relation Age of Onset  . Cancer Mother        bladder  . Heart disease Father   . Diabetes Father     Social History   Tobacco Use  . Smoking status: Current Some Day Smoker    Packs/day: 3.00    Years: 25.00    Pack years: 75.00    Types: Cigarettes  . Smokeless tobacco: Never Used  Substance Use Topics  . Alcohol use: Yes    Alcohol/week: 7.0 standard drinks    Types: 7 Glasses of wine per week    Comment: 1 glass winde daily  . Drug use: No    Home Medications Prior to Admission medications   Medication Sig Start Date End  Date Taking? Authorizing Provider  busPIRone (BUSPAR) 10 MG tablet Take 1 tablet (10 mg total) by mouth 3 (three) times daily. Patient taking differently: Take 10 mg by mouth 3 (three) times daily as needed (for anxiety).  08/25/18   Shelly Coss, MD  escitalopram (LEXAPRO) 10 MG tablet Take 1 tablet (10 mg total) by mouth daily. 08/25/18   Shelly Coss, MD  folic acid (FOLVITE) 1 MG tablet Take 1 tablet (1 mg total) by mouth daily. 01/14/20   Little Ishikawa, MD  furosemide (LASIX) 20 MG tablet Take 1 tablet (20 mg total) by mouth daily. 01/14/20   Little Ishikawa, MD   lactulose (CHRONULAC) 10 GM/15ML solution Take 30 mLs (20 g total) by mouth 2 (two) times daily. 01/14/20   Little Ishikawa, MD  Multiple Vitamin (MULTIVITAMIN WITH MINERALS) TABS tablet Take 1 tablet by mouth daily. 01/14/20   Little Ishikawa, MD  predniSONE (DELTASONE) 10 MG tablet Take 4 tablets (40 mg total) by mouth daily for 3 days, THEN 3 tablets (30 mg total) daily for 3 days, THEN 2 tablets (20 mg total) daily for 3 days, THEN 1 tablet (10 mg total) daily for 3 days. 01/14/20 01/26/20  Little Ishikawa, MD  PROAIR HFA 108 [phone] Base) MCG/ACT inhaler Inhale 2 puffs into the lungs every 4 (four) hours as needed for wheezing or shortness of breath.  04/20/18   [provider]  senna-docusate (SENOKOT-S) 8.6-50 MG tablet Take 1 tablet by mouth at bedtime as needed for mild constipation. 01/14/20   Little Ishikawa, MD  spironolactone (ALDACTONE) 25 MG tablet Take 0.5 tablets (12.5 mg total) by mouth daily. 01/14/20   Little Ishikawa, MD  thiamine 100 MG tablet Take 1 tablet (100 mg total) by mouth daily. 01/14/20   Little Ishikawa, MD    Allergies    Codeine and Tape  Review of Systems   Review of Systems  Constitutional: Negative for fever.  HENT: Negative for sore throat.   Eyes: Negative for visual disturbance.  Respiratory: Negative for shortness of breath.   Cardiovascular: Negative for chest pain.  Gastrointestinal: Negative for abdominal pain.  Genitourinary: Negative for dysuria.  Musculoskeletal: Negative for neck pain.  Skin: Negative for rash.  Neurological: Negative for headaches.    Physical Exam Updated Vital Signs BP 112/63   Pulse 91   Temp (!) 97.5 F (36.4 C) (Axillary)   Resp 14   Ht 5\' 4"  (1.626 m)   Wt 52 kg   SpO2 98%   BMI 19.68 kg/m   Physical Exam Vitals and nursing note reviewed.  Constitutional:      General: She is not in acute distress.    Appearance: She is well-developed.  HENT:     Head: Normocephalic and  atraumatic.  Eyes:     Conjunctiva/sclera: Conjunctivae normal.  Cardiovascular:     Rate and Rhythm: Normal rate and regular rhythm.     Heart sounds: No murmur heard.   Pulmonary:     Effort: Pulmonary effort is normal. No respiratory distress.     Breath sounds: Normal breath sounds.  Abdominal:     Palpations: Abdomen is soft.     Tenderness: There is no abdominal tenderness. There is no guarding or rebound.  Musculoskeletal:        General: Normal range of motion.     Cervical back: Neck supple.     Right lower leg: Edema present.     Left lower  leg: Edema present.  Skin:    General: Skin is warm and dry.     Capillary Refill: Capillary refill takes less than 2 seconds.  Neurological:     General: No focal deficit present.     Mental Status: She is alert. She is disoriented.     ED Results / Procedures / Treatments   Labs (all labs ordered are listed, but only abnormal results are displayed) Labs Reviewed  COMPREHENSIVE METABOLIC PANEL - Abnormal; Notable for the following components:      Result Value   Potassium 2.5 (*)    Chloride 92 (*)    Glucose, Bld 124 (*)    BUN 25 (*)    Creatinine, Ser 1.34 (*)    Calcium 7.6 (*)    Total Protein 6.0 (*)    Albumin 2.8 (*)    AST 75 (*)    ALT 45 (*)    Alkaline Phosphatase 142 (*)    Total Bilirubin 6.8 (*)    GFR calc non Af Amer 40 (*)    GFR calc Af Amer 46 (*)    All other components within normal limits  CBC WITH DIFFERENTIAL/PLATELET - Abnormal; Notable for the following components:   WBC 12.1 (*)    RBC 3.30 (*)    Hemoglobin 11.5 (*)    HCT 34.8 (*)    MCV 105.5 (*)    MCH 34.8 (*)    Platelets 146 (*)    Neutro Abs 9.8 (*)    Abs Immature Granulocytes 0.12 (*)    All other components within normal limits  LACTIC ACID, PLASMA - Abnormal; Notable for the following components:   Lactic Acid, Venous 2.1 (*)    All other components within normal limits  URINALYSIS, ROUTINE W REFLEX MICROSCOPIC -  Abnormal; Notable for the following components:   Color, Urine AMBER (*)    APPearance HAZY (*)    Glucose, UA 50 (*)    Hgb urine dipstick SMALL (*)    Bilirubin Urine SMALL (*)    Protein, ur 30 (*)    Nitrite POSITIVE (*)    Bacteria, UA RARE (*)    All other components within normal limits  PROTIME-INR - Abnormal; Notable for the following components:   Prothrombin Time 16.5 (*)    INR 1.4 (*)    All other components within normal limits  AMMONIA - Abnormal; Notable for the following components:   Ammonia 52 (*)    All other components within normal limits  SARS CORONAVIRUS 2 BY RT PCR (HOSPITAL ORDER, [facility])  CULTURE, BLOOD (ROUTINE X 2)  CULTURE, BLOOD (ROUTINE X 2)  URINE CULTURE  ETHANOL  LACTIC ACID, PLASMA  LIPASE, BLOOD  COMPREHENSIVE METABOLIC PANEL  CBC    EKG EKG Interpretation  Date/Time:  Friday January 25 2020 11:02:34 EDT Ventricular Rate:  93 PR Interval:    QRS Duration: 90 QT Interval:  393 QTC Calculation: 489 R Axis:   133 Text Interpretation: Sinus rhythm Atrial premature complex Right axis deviation Abnormal lateral Q waves No significant change since prior 2/20 Confirmed by Aletta Edouard [phone]) on 01/25/2020 12:05:35 PM   Radiology DG Chest Port 1 View  Result Date: 01/25/2020 CLINICAL DATA:  Altered mental status EXAM: PORTABLE CHEST 1 VIEW COMPARISON:  01/04/2020 FINDINGS: Unchanged elevation of the right hemidiaphragm. No acute appearing airspace opacity. The heart and mediastinum are unremarkable. Left shoulder reverse total arthroplasty. IMPRESSION: Unchanged elevation of the right hemidiaphragm.  No acute appearing airspace opacity. Electronically Signed   By: Eddie Candle M.D.   On: 01/25/2020 12:20    Procedures Procedures (including critical care time)  Medications Ordered in ED Medications  potassium chloride 10 mEq in 100 mL IVPB (10 mEq Intravenous New Bag/Given 01/25/20 1659)  enoxaparin (LOVENOX)  injection 30 mg (has no administration in time range)  albumin human 25 % solution 12.5 g (0 g Intravenous Hold 01/25/20 1647)  nicotine (NICODERM CQ - dosed in mg/24 hours) patch 14 mg (has no administration in time range)  ondansetron (ZOFRAN) injection 4 mg (has no administration in time range)  lactulose (CHRONULAC) 10 GM/15ML solution 20 g (20 g Oral Not Given 01/25/20 1618)  senna-docusate (Senokot-S) tablet 1 tablet (has no administration in time range)  albuterol (PROVENTIL) (2.5 MG/3ML) 0.083% nebulizer solution 2.5 mg (has no administration in time range)  cefTRIAXone (ROCEPHIN) 1 g in sodium chloride 0.9 % 100 mL IVPB (0 g Intravenous Stopped 01/25/20 1234)  sodium chloride 0.9 % bolus 500 mL (0 mLs Intravenous Stopped 01/25/20 1327)    ED Course  I have reviewed the triage vital signs and the nursing notes.  Pertinent labs & imaging results that were available during my care of the patient were reviewed by me and considered in my medical decision making (see chart for details).  Clinical Course as of Jan 24 1722  Fri Jan 25, 2020  1155 Chest x-ray with no gross infiltrates similar to prior, interpreted by me.  Awaiting radiology reading.   [MB]  1221 With [facility] hospitalist Dr. Sherrian Divers who will evaluate the patient for admission.   [MB]    Clinical Course User Index [MB] Hayden Rasmussen, MD   MDM Rules/Calculators/A&P                         This patient complains of altered mental status/lethargy; this involves an extensive number of treatment Options and is a complaint that carries with it a high risk of complications and Morbidity. The differential includes sepsis, Sirs, metabolic derangement, liver failure hypoxia  I ordered, reviewed and interpreted labs, which included CBC with mildly elevated white count of 12, low hemoglobin of 11.5 better than baseline, chemistries with low potassium of 2.5 repleted, worsening creatinine, worsening LFTs including rising bili up to 6.8,  INR abnormal at 1.4, ammonia of 52 similar to prior admission, urinalysis concerning for infection with 6-10 whites nitrite positive  I ordered medication IV fluids and IV antibiotics, IV potassium I ordered imaging studies which included chest x-ray and I independently    visualized and interpreted imaging which showed low lung volumes no gross infiltrates Previous records obtained and reviewed in epic including prior admission for alcoholic cirrhosis I consulted [facility] hospitalist Dr. Wyline Copas and discussed lab and imaging findings  Critical Interventions: None  After the interventions stated above, I reevaluated the patient and found patient still to be lethargic and minimally communicative.  Will need admission for continued management of her urinary tract infection along with metabolic derangement and worsening liver failure.   Final Clinical Impression(s) / ED Diagnoses Final diagnoses:  Altered mental status, unspecified altered mental status type  Alcoholic cirrhosis, unspecified whether ascites present ([place])  Hypokalemia    Rx / DC Orders ED Discharge Orders    None       Hayden Rasmussen, MD 01/25/20 1728

## 2020-01-25 NOTE — ED Triage Notes (Signed)
Pt arrived from NH facility via EMS, per EMS staff at facility states pt has been altered since last night. Normally Aox4, pt only oriented to person in triage. Per staff, pt has not been taking her meds, has been refusing. Pt able to follow commands, speaking clearly, but confused. No other neuro deficit noted. Able to move all extremities.

## 2020-01-25 NOTE — ED Notes (Signed)
Phlebotomy called to stick pt for lactic acid. Unable to obtain 2nd lactic from IV or sticking, 2 RN's tried to get blood.

## 2020-01-25 NOTE — H&P (Signed)
History and Physical    BETANIA DIZON KDX:[id number] DOB: [birth year] DOA: 01/25/2020  PCP: Wenda Low, MD  Patient coming from: SNF  Chief Complaint: Confusion, refusing meds, jaundice  HPI: [name] is a 69 y.o. female with medical history significant of alcoholic cirrhosis with ascites, hx of etoh abuse, tobacco abuse, hx lung cancer s/p chemo, HTN presents to ED from SNF for increased confusion, refusing meds and increased jaundice. Pt currently confused and unable to provide full history and unwilling to fully participate in exam. Pt reportedly was discharged recently  On 7/5 following admission for treatment of ETOH hepatitis with ascites, ultimately being worked up for alcoholic cirrhosis by [facility]. Since discharge to SNF, pt has been refusing meds and food per daughter. Pt also noted to be more edematous after not taking meds. Given worsening confusion and jaundice, pt was brought to ED for further work up.  ED Course: In the ED, pt found to have K of 2.5, Cr of 1.34, ammonia of 52. Total bilirubin was elevated at 6.8 (from 2.9 at discharge). UA was notable for 6-10 WBC's with pos nitrites. Cultures were obtained and pt started on empiric rocephin. Hospitalist consulted for consideration for admission  Review of Systems:  Review of Systems  Unable to perform ROS: Mental acuity    Past Medical History:  Diagnosis Date  . Anemia   . Cancer [facility]) 2007   lung cancer, right, chemo done  . Complication of anesthesia 2007   slow to awaken  . History of blood transfusion 2010  . Hypertension   . Occasional tremors     Past Surgical History:  Procedure Laterality Date  . COLONOSCOPY WITH PROPOFOL N/A 11/28/2012   Procedure: COLONOSCOPY WITH PROPOFOL;  Surgeon: Garlan Fair, MD;  [facility];  Service: Endoscopy;  Laterality: N/A;  . REVERSE SHOULDER ARTHROPLASTY Left 08/14/2018   Procedure: REVERSE SHOULDER ARTHROPLASTY;  Surgeon: Netta Cedars, MD;  Location:  [facility];  Service: Orthopedics;  Laterality: Left;  . right lung lobectomy  2007     reports that she has been smoking cigarettes. She has a 75.00 pack-year smoking history. She has never used smokeless tobacco. She reports current alcohol use of about 7.0 standard drinks of alcohol per week. She reports that she does not use drugs.  Allergies  Allergen Reactions  . Codeine Other (See Comments)    Shakes and tremors  . Tape Other (See Comments)    Patient PREFERS paper tape    Family History  Problem Relation Age of Onset  . Cancer Mother        bladder  . Heart disease Father   . Diabetes Father     Prior to Admission medications   Medication Sig Start Date End Date Taking? Authorizing Provider  busPIRone (BUSPAR) 10 MG tablet Take 1 tablet (10 mg total) by mouth 3 (three) times daily. 08/25/18  Yes Shelly Coss, MD  escitalopram (LEXAPRO) 10 MG tablet Take 1 tablet (10 mg total) by mouth daily. 08/25/18  Yes Shelly Coss, MD  folic acid (FOLVITE) 1 MG tablet Take 1 tablet (1 mg total) by mouth daily. 01/14/20  Yes Little Ishikawa, MD  furosemide (LASIX) 20 MG tablet Take 1 tablet (20 mg total) by mouth daily. 01/14/20  Yes Little Ishikawa, MD  Multiple Vitamin (MULTIVITAMIN WITH MINERALS) TABS tablet Take 1 tablet by mouth daily. 01/14/20  Yes Little Ishikawa, MD  ondansetron (ZOFRAN) 4 MG tablet Take 4 mg by  mouth every 6 (six) hours as needed for nausea or vomiting.   Yes [provider]  predniSONE (DELTASONE) 10 MG tablet Take 4 tablets (40 mg total) by mouth daily for 3 days, THEN 3 tablets (30 mg total) daily for 3 days, THEN 2 tablets (20 mg total) daily for 3 days, THEN 1 tablet (10 mg total) daily for 3 days. 01/14/20 01/26/20 Yes Little Ishikawa, MD  PRESCRIPTION MEDICATION Take 120 mLs by mouth 3 (three) times daily. Medpass   Yes [provider]  spironolactone (ALDACTONE) 25 MG tablet Take 0.5 tablets (12.5 mg total) by mouth daily. 01/14/20   Yes Little Ishikawa, MD  thiamine 100 MG tablet Take 1 tablet (100 mg total) by mouth daily. 01/14/20  Yes Little Ishikawa, MD  lactulose (CHRONULAC) 10 GM/15ML solution Take 30 mLs (20 g total) by mouth 2 (two) times daily. 01/14/20   Little Ishikawa, MD  PROAIR HFA 108 [phone] Base) MCG/ACT inhaler Inhale 2 puffs into the lungs every 4 (four) hours as needed for wheezing or shortness of breath.  04/20/18   [provider]  senna-docusate (SENOKOT-S) 8.6-50 MG tablet Take 1 tablet by mouth at bedtime as needed for mild constipation. 01/14/20   Little Ishikawa, MD    Physical Exam: Vitals:   01/25/20 1056 01/25/20 1058 01/25/20 1059 01/25/20 1230  BP:  99/67  (!) 95/53  Pulse:  94  91  Resp:  18  (!) 25  Temp:  (!) 97.5 F (36.4 C)    TempSrc:  Axillary    SpO2: 98% 99%  99%  Weight:   52 kg   Height:   5\' 4"  (1.626 m)     Constitutional: NAD, calm, comfortable Vitals:   01/25/20 1056 01/25/20 1058 01/25/20 1059 01/25/20 1230  BP:  99/67  (!) 95/53  Pulse:  94  91  Resp:  18  (!) 25  Temp:  (!) 97.5 F (36.4 C)    TempSrc:  Axillary    SpO2: 98% 99%  99%  Weight:   52 kg   Height:   5\' 4"  (1.626 m)    Eyes: PERRL, lids and conjunctivae normal ENMT: Mucous membranes are dry. Dentition fair Neck: normal, supple, no masses, no thyromegaly Respiratory: clear to auscultation bilaterally, no audible wheezing,. Normal respiratory effort. No accessory muscle use.  Cardiovascular: Regular rate and rhythm, s1, s2 Abdomen: no tenderness, pos bs.  Musculoskeletal: no clubbing / cyanosis. No joint deformity upper and lower extremities. Good ROM, no contractures. Normal muscle tone.  Skin: no rashes,jaundice Neurologic: CN 2-12 grossly intact. Sensation intact,Strength 5/5 in all 4.  Psychiatric: Normal judgment and insight. Oriented x 1 (knows self)   Labs on Admission: I have personally reviewed following labs and imaging studies  CBC: Recent Labs  Lab  01/25/20 1108  WBC 12.1*  NEUTROABS 9.8*  HGB 11.5*  HCT 34.8*  MCV 105.5*  PLT 542*   Basic Metabolic Panel: Recent Labs  Lab 01/25/20 1108  NA 137  K 2.5*  CL 92*  CO2 31  GLUCOSE 124*  BUN 25*  CREATININE 1.34*  CALCIUM 7.6*   GFR: Estimated Creatinine Clearance: 32.1 mL/min (A) (by C-G formula based on SCr of 1.34 mg/dL (H)). Liver Function Tests: Recent Labs  Lab 01/25/20 1108  AST 75*  ALT 45*  ALKPHOS 142*  BILITOT 6.8*  PROT 6.0*  ALBUMIN 2.8*   Recent Labs  Lab 01/25/20 1108  LIPASE 46   Recent  Labs  Lab 01/25/20 1052  AMMONIA 52*   Coagulation Profile: Recent Labs  Lab 01/25/20 1108  INR 1.4*   Cardiac Enzymes: No results for input(s): CKTOTAL, CKMB, CKMBINDEX, TROPONINI in the last 168 hours. BNP (last 3 results) No results for input(s): PROBNP in the last 8760 hours. HbA1C: No results for input(s): HGBA1C in the last 72 hours. CBG: No results for input(s): GLUCAP in the last 168 hours. Lipid Profile: No results for input(s): CHOL, HDL, LDLCALC, TRIG, CHOLHDL, LDLDIRECT in the last 72 hours. Thyroid Function Tests: No results for input(s): TSH, T4TOTAL, FREET4, T3FREE, THYROIDAB in the last 72 hours. Anemia Panel: No results for input(s): VITAMINB12, FOLATE, FERRITIN, TIBC, IRON, RETICCTPCT in the last 72 hours. Urine analysis:    Component Value Date/Time   COLORURINE AMBER (A) 01/25/2020 1108   APPEARANCEUR HAZY (A) 01/25/2020 1108   LABSPEC 1.018 01/25/2020 1108   PHURINE 5.0 01/25/2020 1108   GLUCOSEU 50 (A) 01/25/2020 1108   HGBUR SMALL (A) 01/25/2020 1108   BILIRUBINUR SMALL (A) 01/25/2020 1108   KETONESUR NEGATIVE 01/25/2020 1108   PROTEINUR 30 (A) 01/25/2020 1108   NITRITE POSITIVE (A) 01/25/2020 1108   LEUKOCYTESUR NEGATIVE 01/25/2020 1108   Sepsis Labs: !!!!!!!!!!!!!!!!!!!!!!!!!!!!!!!!!!!!!!!!!!!! @LABRCNTIP (procalcitonin:4,lacticidven:4) )No results found for this or any previous visit (from the past 240 hour(s)).    Radiological Exams on Admission: DG Chest Port 1 View  Result Date: 01/25/2020 CLINICAL DATA:  Altered mental status EXAM: PORTABLE CHEST 1 VIEW COMPARISON:  01/04/2020 FINDINGS: Unchanged elevation of the right hemidiaphragm. No acute appearing airspace opacity. The heart and mediastinum are unremarkable. Left shoulder reverse total arthroplasty. IMPRESSION: Unchanged elevation of the right hemidiaphragm. No acute appearing airspace opacity. Electronically Signed   By: Eddie Candle M.D.   On: 01/25/2020 12:20    EKG: Independently reviewed. Sinus  Assessment/Plan Principal Problem:   Toxic metabolic encephalopathy Active Problems:   COPD pfts pending/ active smoker   Elevated LFTs   Hypokalemia   Malnutrition of moderate degree   Alcoholism ([place])   Hyperbilirubinemia   ARF (acute renal failure) ([place])   Acute lower UTI   DNR (do not resuscitate)   1. Toxic metabolic encephalopathy 1. Suspect multifactorial including below UTI as well as progressive liver disease 2. Ammonia unchanged since recent discharge 3. Would continue lactulose as tolerated 4. Rocephin started in ED, will continue 5. Avoid sedating meds if possible 6. F/u cmp and cbc in AM 2. UTI 1. UA reviewed, suggestive of UTI with small blood, pos nitrates and WBC's noted 2. Urine and blood cx pending 3. Rocephin started in ED, would continue 3. COPD 1. CXR reviewed, unremarkable 2. Currently on minimal O2 support 4. Alcoholic cirrhosis 1. Reported had been refusing meds at facility, including lactulose 2. LFT's overall stable however bilirubin up to 6.8 3. Will repeat abd [place] for interval change 4. Given ARF, would hold diuretics for the time being and repeat renal panel in  AM. Avoid overhydration given volume overload at last admit prompting US guided paracentesis yielding 2.4L fluid  5. Moderate protein calorie malnutrition 1. Would consult dietitian 6. Elevated LFT's 1. Per above, would cont  lactulose 2. Avoid hepatotoxic agents 3. Repeat LFT in AM while off diuretics today 7. ARF 1. Holding diuretics per above 2. Encourage low sodium diet 3. Repeat bmet in AM 8. Hypokalemia 1. Will replace 2. Repeat bmet in AM  DVT prophylaxis: lovenox subq  Code Status: DNR, confirmed with daughter over phone Family Communication: Pt in room,  daughter over phone Disposition Plan: Uncertain at this time  Consults called:  Admission status: Inpatient as would likely require greater than 2 midnight stay to treat IV abx for UTI and treat acute encephalopathy   Marylu Lund MD [facility] Pager On Amion  If 7PM-7AM, please contact night-coverage  01/25/2020, 1:06 PM

## 2020-01-26 ENCOUNTER — Inpatient Hospital Stay (HOSPITAL_COMMUNITY): Payer: Medicare Other

## 2020-01-26 ENCOUNTER — Other Ambulatory Visit: Payer: Self-pay

## 2020-01-26 DIAGNOSIS — E440 Moderate protein-calorie malnutrition: Secondary | ICD-10-CM

## 2020-01-26 DIAGNOSIS — Z7189 Other specified counseling: Secondary | ICD-10-CM

## 2020-01-26 DIAGNOSIS — Z66 Do not resuscitate: Secondary | ICD-10-CM

## 2020-01-26 DIAGNOSIS — K7030 Alcoholic cirrhosis of liver without ascites: Secondary | ICD-10-CM

## 2020-01-26 DIAGNOSIS — N390 Urinary tract infection, site not specified: Secondary | ICD-10-CM

## 2020-01-26 DIAGNOSIS — Z515 Encounter for palliative care: Secondary | ICD-10-CM

## 2020-01-26 DIAGNOSIS — R531 Weakness: Secondary | ICD-10-CM

## 2020-01-26 LAB — CBC
HCT: 32.1 % — ABNORMAL LOW (ref 36.0–46.0)
Hemoglobin: 10.8 g/dL — ABNORMAL LOW (ref 12.0–15.0)
MCH: 34.2 pg — ABNORMAL HIGH (ref 26.0–34.0)
MCHC: 33.6 g/dL (ref 30.0–36.0)
MCV: 101.6 fL — ABNORMAL HIGH (ref 80.0–100.0)
Platelets: 100 10*3/uL — ABNORMAL LOW (ref 150–400)
RBC: 3.16 MIL/uL — ABNORMAL LOW (ref 3.87–5.11)
RDW: 12.9 % (ref 11.5–15.5)
WBC: 12.5 10*3/uL — ABNORMAL HIGH (ref 4.0–10.5)
nRBC: 0 % (ref 0.0–0.2)

## 2020-01-26 LAB — COMPREHENSIVE METABOLIC PANEL
ALT: 38 U/L (ref 0–44)
AST: 72 U/L — ABNORMAL HIGH (ref 15–41)
Albumin: 2.3 g/dL — ABNORMAL LOW (ref 3.5–5.0)
Alkaline Phosphatase: 124 U/L (ref 38–126)
Anion gap: 13 (ref 5–15)
BUN: 23 mg/dL (ref 8–23)
CO2: 29 mmol/L (ref 22–32)
Calcium: 7.3 mg/dL — ABNORMAL LOW (ref 8.9–10.3)
Chloride: 94 mmol/L — ABNORMAL LOW (ref 98–111)
Creatinine, Ser: 1.2 mg/dL — ABNORMAL HIGH (ref 0.44–1.00)
GFR calc Af Amer: 53 mL/min — ABNORMAL LOW (ref >60)
GFR calc non Af Amer: 46 mL/min — ABNORMAL LOW (ref >60)
Glucose, Bld: 81 mg/dL (ref 70–99)
Potassium: 3.2 mmol/L — ABNORMAL LOW (ref 3.5–5.1)
Sodium: 136 mmol/L (ref 135–145)
Total Bilirubin: 5.7 mg/dL — ABNORMAL HIGH (ref 0.3–1.2)
Total Protein: 5.3 g/dL — ABNORMAL LOW (ref 6.5–8.1)

## 2020-01-26 LAB — URINE CULTURE: Culture: NO GROWTH

## 2020-01-26 MED ORDER — BISACODYL 10 MG RE SUPP
10.0000 mg | Freq: Once | RECTAL | Status: AC
  Administered 2020-01-26: 10 mg via RECTAL
  Filled 2020-01-26: qty 1

## 2020-01-26 MED ORDER — ORAL CARE MOUTH RINSE
15.0000 mL | Freq: Two times a day (BID) | OROMUCOSAL | Status: DC
  Administered 2020-01-26 – 2020-01-29 (×6): 15 mL via OROMUCOSAL

## 2020-01-26 MED ORDER — POTASSIUM CHLORIDE 10 MEQ/100ML IV SOLN
10.0000 meq | INTRAVENOUS | Status: AC
  Administered 2020-01-26 (×3): 10 meq via INTRAVENOUS
  Filled 2020-01-26 (×3): qty 100

## 2020-01-26 MED ORDER — ALBUMIN HUMAN 25 % IV SOLN
12.5000 g | Freq: Four times a day (QID) | INTRAVENOUS | Status: AC
  Administered 2020-01-26 – 2020-01-27 (×4): 12.5 g via INTRAVENOUS
  Filled 2020-01-26 (×4): qty 50

## 2020-01-26 MED ORDER — TRAMADOL HCL 50 MG PO TABS: 50.0000 mg | ORAL_TABLET | Freq: Four times a day (QID) | ORAL | Status: DC | PRN

## 2020-01-26 MED ORDER — RIFAXIMIN 550 MG PO TABS
550.0000 mg | ORAL_TABLET | Freq: Two times a day (BID) | ORAL | Status: DC
  Administered 2020-01-26 – 2020-01-27 (×3): 550 mg via ORAL
  Filled 2020-01-26 (×7): qty 1

## 2020-01-26 MED ORDER — LACTULOSE ENEMA
300.0000 mL | Freq: Two times a day (BID) | ORAL | Status: DC | PRN
  Administered 2020-01-27: 300 mL via RECTAL
  Filled 2020-01-26 (×3): qty 300

## 2020-01-26 NOTE — Progress Notes (Signed)
Pt disimpacted per MD's order. Prior to disimpaction, Pt c/o pain abdominal pain with guarding. Moderate amount of soft formed brown stool obtained. Pt given dulcolax suppository after. Upon completion of disimpaction, Pt denied having abdominal pain. Pt resting comfortably in bed with eyes closed.

## 2020-01-26 NOTE — Progress Notes (Signed)
PT Cancellation Note  Patient Details Name: [name] MRN: [id number] DOB: August 06, 1950   Cancelled Treatment:    Reason Eval/Treat Not Completed: Patient at procedure or test/unavailable - will check back as schedule allows.  [facility] Pager [phone]  Office [phone]    Roxine Caddy D Elonda Husky 01/26/2020, 11:33 AM

## 2020-01-26 NOTE — Consult Note (Signed)
Referring Provider: Dr. Antonieta Pert [facility]) Primary Care Physician:  Wenda Low, MD Primary Gastroenterologist: Dr. Otis Brace (former patient of Dr. Howell Rucks, seen by Dr. Alessandra Bevels on last admission but had not yet followed up with him in the office)  Reason for Consultation: Worsening liver function  HPI: [name] is a 69 y.o. female with alcoholic liver disease/alcoholic hepatitis, history of COPD and lung cancer treated with chemotherapy, with ongoing smoking, and hypertension, who was discharged from this hospital about 10 days ago but readmitted yesterday because of worsening mental status.  The patient has alcoholic liver disease which apparently had been occult, and reasonably well compensated, to the point where as an outpatient just prior to her last admission, it was not even on her problem list, until abnormal labs came back showing elevated liver chemistries and low albumin.  Note that the patient's liver chemistries have been completely normal when checked 2 years earlier.  The patient was admitted on June 25 for a roughly 10-day stay because of abdominal pain, nausea and vomiting, and fatigue while reportedly drinking as much as 2 bottles of wine per day.  She was noted to have ascites, and underwent a 2.4 L paracentesis on June 26, with transudative, uninfected fluid.  While in the hospital, the patient also had significant issues with confusion to the point where it was no longer felt that she could live at home alone, and she was admitted to a skilled nursing facility.  She was transferred from that facility to the emergency room yesterday because of worsened mental status, refusing medications and food, also experiencing progressive edema, and now jaundiced.  It was noted that, from time of discharge just 10 days earlier, her bilirubin had risen from 2.9 to 6.8.  She was also azotemic on admission, perhaps due to dehydration; creatinine have been 0.9 when most  recently checked prior to discharge on her last admission, and she came in with a creatinine of 1.34, but it has fallen to 1.2 overnight.  Her transaminases have dropped approximately 30% compared to her last admission.  Albumin remains low at 2.3, INR is borderline at 1.4, ammonia was moderately elevated at 52 at time of admission.  Outpatient medications included lactulose and prednisolone.  Imaging studies are pertinent for the fact that about a year and a half ago, in February 2020, the patient had fatty infiltration of the liver but no evidence of ascites or splenomegaly.   Past Medical History:  Diagnosis Date  . Anemia   . Cancer [facility]) 2007   lung cancer, right, chemo done  . Complication of anesthesia 2007   slow to awaken  . History of blood transfusion 2010  . Hypertension   . Occasional tremors     Past Surgical History:  Procedure Laterality Date  . COLONOSCOPY WITH PROPOFOL N/A 11/28/2012   Procedure: COLONOSCOPY WITH PROPOFOL;  Surgeon: Garlan Fair, MD;  Location: [facility];  Service: Endoscopy;  Laterality: N/A;  . REVERSE SHOULDER ARTHROPLASTY Left 08/14/2018   Procedure: REVERSE SHOULDER ARTHROPLASTY;  Surgeon: Netta Cedars, MD;  Location: [facility];  Service: Orthopedics;  Laterality: Left;  . right lung lobectomy  2007    Prior to Admission medications   Medication Sig Start Date End Date Taking? Authorizing Provider  busPIRone (BUSPAR) 10 MG tablet Take 1 tablet (10 mg total) by mouth 3 (three) times daily. 08/25/18  Yes Shelly Coss, MD  escitalopram (LEXAPRO) 10 MG tablet Take 1 tablet (10 mg total) by mouth  daily. 08/25/18  Yes Shelly Coss, MD  folic acid (FOLVITE) 1 MG tablet Take 1 tablet (1 mg total) by mouth daily. 01/14/20  Yes Little Ishikawa, MD  furosemide (LASIX) 20 MG tablet Take 1 tablet (20 mg total) by mouth daily. 01/14/20  Yes Little Ishikawa, MD  Multiple Vitamin (MULTIVITAMIN WITH MINERALS) TABS tablet Take 1 tablet by mouth  daily. 01/14/20  Yes Little Ishikawa, MD  ondansetron (ZOFRAN) 4 MG tablet Take 4 mg by mouth every 6 (six) hours as needed for nausea or vomiting.   Yes [provider]  predniSONE (DELTASONE) 10 MG tablet Take 4 tablets (40 mg total) by mouth daily for 3 days, THEN 3 tablets (30 mg total) daily for 3 days, THEN 2 tablets (20 mg total) daily for 3 days, THEN 1 tablet (10 mg total) daily for 3 days. 01/14/20 01/26/20 Yes Little Ishikawa, MD  PRESCRIPTION MEDICATION Take 120 mLs by mouth 3 (three) times daily. Medpass   Yes [provider]  spironolactone (ALDACTONE) 25 MG tablet Take 0.5 tablets (12.5 mg total) by mouth daily. 01/14/20  Yes Little Ishikawa, MD  thiamine 100 MG tablet Take 1 tablet (100 mg total) by mouth daily. 01/14/20  Yes Little Ishikawa, MD  lactulose (CHRONULAC) 10 GM/15ML solution Take 30 mLs (20 g total) by mouth 2 (two) times daily. 01/14/20   Little Ishikawa, MD  PROAIR HFA 108 [phone] Base) MCG/ACT inhaler Inhale 2 puffs into the lungs every 4 (four) hours as needed for wheezing or shortness of breath.  04/20/18   [provider]  senna-docusate (SENOKOT-S) 8.6-50 MG tablet Take 1 tablet by mouth at bedtime as needed for mild constipation. 01/14/20   Little Ishikawa, MD    Current Facility-Administered Medications  Medication Dose Route Frequency Provider Last Rate Last Admin  . albumin human 25 % solution 12.5 g  12.5 g Intravenous Once Donne Hazel, MD   Held at 01/25/20 1647  . albuterol (PROVENTIL) (2.5 MG/3ML) 0.083% nebulizer solution 2.5 mg  2.5 mg Inhalation Q4H PRN Donne Hazel, MD      . cefTRIAXone (ROCEPHIN) 2 g in sodium chloride 0.9 % 100 mL IVPB  2 g Intravenous Q24H Donne Hazel, MD 200 mL/hr at 01/26/20 1003 2 g at 01/26/20 1003  . lactulose (CHRONULAC) 10 GM/15ML solution 20 g  20 g Oral BID Donne Hazel, MD   20 g at 01/26/20 1004  . nicotine (NICODERM CQ - dosed in mg/24 hours) patch 14 mg  14 mg  Transdermal Daily Donne Hazel, MD   14 mg at 01/26/20 1040  . ondansetron (ZOFRAN) injection 4 mg  4 mg Intravenous Q6H PRN Donne Hazel, MD      . potassium chloride 10 mEq in 100 mL IVPB  10 mEq Intravenous Q1 Hr x 3 Kc, Ramesh, MD 100 mL/hr at 01/26/20 1110 10 mEq at 01/26/20 1110  . senna-docusate (Senokot-S) tablet 1 tablet  1 tablet Oral QHS PRN Donne Hazel, MD        Allergies as of 01/25/2020 - Review Complete 01/25/2020  Allergen Reaction Noted  . Codeine Other (See Comments) 01/04/2020  . Tape Other (See Comments) 08/12/2018    Family History  Problem Relation Age of Onset  . Cancer Mother        bladder  . Heart disease Father   . Diabetes Father     Social History   Socioeconomic History  .  Marital status: Widowed    Spouse name: Not on file  . Number of children: Not on file  . Years of education: Not on file  . Highest education level: Not on file  Occupational History  . Not on file  Tobacco Use  . Smoking status: Current Some Day Smoker    Packs/day: 3.00    Years: 25.00    Pack years: 75.00    Types: Cigarettes  . Smokeless tobacco: Never Used  Substance and Sexual Activity  . Alcohol use: Yes    Alcohol/week: 7.0 standard drinks    Types: 7 Glasses of wine per week    Comment: 1 glass winde daily  . Drug use: No  . Sexual activity: Not on file  Other Topics Concern  . Not on file  Social History Narrative  . Not on file   Social Determinants of Health   Financial Resource Strain:   . Difficulty of Paying Living Expenses:   Food Insecurity:   . Worried About Charity fundraiser in the Last Year:   . Arboriculturist in the Last Year:   Transportation Needs:   . Film/video editor (Medical):   Marland Kitchen Lack of Transportation (Non-Medical):   Physical Activity:   . Days of Exercise per Week:   . Minutes of Exercise per Session:   Stress:   . Feeling of Stress :   Social Connections:   . Frequency of Communication with Friends and  Family:   . Frequency of Social Gatherings with Friends and Family:   . Attends Religious Services:   . Active Member of Clubs or Organizations:   . Attends Archivist Meetings:   Marland Kitchen Marital Status:   Intimate Partner Violence:   . Fear of Current or Ex-Partner:   . Emotionally Abused:   Marland Kitchen Physically Abused:   . Sexually Abused:     Review of Systems: Unobtainable due to patient's confusion  Physical Exam: Vital signs in last 24 hours: Temp:  [97.3 F (36.3 C)-97.8 F (36.6 C)] 97.6 F (36.4 C) (07/17 0804) Pulse Rate:  [79-93] 83 (07/17 0804) Resp:  [12-25] 16 (07/17 0804) BP: (93-126)/(50-72) 111/58 (07/17 0804) SpO2:  [97 %-100 %] 99 % (07/17 0804)   General: Chronically ill-appearing elderly female, appears uncomfortable or mildly distressed, fidgeting, and with periodic mild myoclonic jerks of her head, torso, and upper extremities Head:  Normocephalic and atraumatic. Eyes:  Sclera clear, no overt icterus despite elevated bilirubin.   Conjunctiva pale. Mouth: Very dry mucous membranes Neck:   No masses or thyromegaly. Lungs:  Clear anteriorly to auscultation.   No wheezes, crackles, or rhonchi. No evident respiratory distress. Heart: Slightly tachycardic; no murmurs, clicks, rubs,  or gallops. Abdomen: Mildly protuberant, flank dullness consistent with ascites, possible spleen tip palpable, liver not felt, no tenderness.  Bowel sounds present Msk:   Symmetrical without gross deformities. Extremities:   Without tibial edema. Neurologic:   Alert but confused.  She keeps saying "help me."  However, does not answer questions.  She did ask "who are you?". Skin: Some actinic changes and small ecchymoses on trunk without classic spiders. Cervical Nodes:  No significant cervical adenopathy. Psych:   Confused, unable to assess  Intake/Output from previous day: 07/16 0701 - 07/17 0700 In: 700 [IV Piggyback:700] Out: 50 [Urine:50] Intake/Output this shift: No  intake/output data recorded.  Lab Results: Recent Labs    01/25/20 1108 01/26/20 0621  WBC 12.1* 12.5*  HGB 11.5* 10.8*  HCT 34.8* 32.1*  PLT 146* 100*   BMET Recent Labs    01/25/20 1108 01/26/20 0621  NA 137 136  K 2.5* 3.2*  CL 92* 94*  CO2 31 29  GLUCOSE 124* 81  BUN 25* 23  CREATININE 1.34* 1.20*  CALCIUM 7.6* 7.3*   LFT Recent Labs    01/26/20 0621  PROT 5.3*  ALBUMIN 2.3*  AST 72*  ALT 38  ALKPHOS 124  BILITOT 5.7*   PT/INR Recent Labs    01/25/20 1108  LABPROT 16.5*  INR 1.4*    Studies/Results: DG Chest Port 1 View  Result Date: 01/25/2020 CLINICAL DATA:  Altered mental status EXAM: PORTABLE CHEST 1 VIEW COMPARISON:  01/04/2020 FINDINGS: Unchanged elevation of the right hemidiaphragm. No acute appearing airspace opacity. The heart and mediastinum are unremarkable. Left shoulder reverse total arthroplasty. IMPRESSION: Unchanged elevation of the right hemidiaphragm. No acute appearing airspace opacity. Electronically Signed   By: Eddie Candle M.D.   On: 01/25/2020 12:20    Impression: 1.  Acute alcoholic hepatitis, with transaminases responding to alcohol avoidance and prednisolone  2.  Ascites and thrombocytopenia, probably reflecting an element of cirrhosis  3.  Progressive hyperbilirubinemia, which can be seen as an initial development following alcohol abstinence but of course could be indicative of progression of the liver disease  4.  Altered mental status, possibly an element of hepatic encephalopathy as well as Warnicke's encephalopathy  Plan: 1.  CT of abdomen and pelvis (without IV contrast, in view of azotemia) to assess anatomy of liver and spleen to see if there is evidence of interim development of cirrhosis compared to last study 18 months ago  2.  Add rifaximin to her regimen to see if it will help address patient's encephalopathy.  If patient unable to take oral medication, we may need to switch to lactulose enemas.  Okay to  continue oral lactulose otherwise.  3.  Patient's current meld score is 19 (estimated 6% mortality over the next 3 months).  I think her prognosis will become clearer over the next month or so, as we track her meld score.  I think there is a fairly good chance that a large component of her current liver problem is reversible with ongoing alcohol avoidance, and that what we are currently seeing is largely a residual from her significant alcoholic hepatitis (sometimes things get worse before they get better).  However, time will tell.   LOS: 1 day   [place]  01/26/2020, 11:24 AM   Pager [phone] If no answer or after 5 PM call [phone]

## 2020-01-26 NOTE — Progress Notes (Signed)
Pt with low bp and tachycardia resulting in yellow MEWS. Pt with no acute changes. Agricultural consultant notified. MD also made aware. [place] MEWS guidelines implemented.

## 2020-01-26 NOTE — Evaluation (Signed)
SLP Cancellation Note  Patient Details Name: ANARELY NICHOLLS MRN: [id number] DOB: 11/19/1950   Cancelled treatment:       Reason Eval/Treat Not Completed: Other (comment) (pt with tachycardia, hypotension)  Kathleen Lime, MS [facility] SLP Acute Rehab Services Office [phone]   Macario Golds 01/26/2020, 3:16 PM

## 2020-01-26 NOTE — Progress Notes (Signed)
OT Cancellation Note  Patient Details Name: [name] MRN: [id number] DOB: 08/18/1950   Cancelled Treatment:    Reason Eval/Treat Not Completed: Patient at procedure or test/ unavailable Patient down for CT this a.m. and IV nurse in room this p.m. Will f/u as able.  Kaniah Rizzolo L Renad Jenniges 01/26/2020, 3:44 PM

## 2020-01-26 NOTE — Progress Notes (Addendum)
PROGRESS NOTE  [name] FUX:[id number] DOB: 07-25-1950 DOA: 01/25/2020 PCP: Wenda Low, MD   Chief Complaint  Patient presents with  . Altered Mental Status    Brief Narrative: As per admitting : 69 y.o. female with medical history significant of alcoholic cirrhosis with ascites, hx of etoh abuse, tobacco abuse, hx lung cancer s/p chemo, HTN presents to ED from SNF for increased confusion, refusing meds and increased jaundice, edema.  She was discharged recently on 7/5, admitted 01/04/20:following admission for treatment of ETOH hepatitis with ascites, encephalopathy/?wernicke's- felt unsafe for home due to confusion and sent to SNF. prior to last admit heavy etoh use 2 bottles wine/daily, living by herself. Since discharge to SNF,pt has been refusing meds and food per daughter and noted to be more edematous after not taking meds.  ED Course:In the ED,pt found to have K of 2.5, Cr of 1.34, ammonia of 52. Total bilirubin was elevated at 6.8 (from 2.9 at discharge). UA was notable for 6-10 WBC's with pos nitrites. Cultures were obtained and pt started on empiric rocephin.Hospitalist consulted for consideration for admission.  Subjective: Alert,awake, confused, able to tell me her name that she is in hospital. Tremulous whole body/mostly on rt side. Follows some commands, RN reports more coperative this am but still very confused Afebrile overnight, potassium slightly up to 3.2 BP soft and HR at times high  Assessment & Plan:  Acute metabolic encephalopathy/possible hepatic encephalopathy in the setting of colic cirrhosis/hepatitis and UTI: ammonia in 36s. On last d/c 01/14/20-alert to person and place only. There was concern for wernicke's encephalopathy-felt unsafe for home due to confusion and sent to SNF, suspected mentation may not improve. Prior to last admit heavy etoh use 2 bottles wine/daily, living by herself. Continue supportive management, IV antibiotics, lactulose fall  precautions. Gi consulted. slp eval for safety on po intake. Avoid sedatives.  No CT head on admission and ordered one this am.  Acute lower UTI continue empiric antibiotic follow-up cultures.  Hypokalemia 3.2 we will replete with IV potassium again today.  Alcoholic liver disease with Hypoalbuminemia slightly coagulopathic with INR 1.4 moderately elevated ammonia 52 -getting CT abdomen to eval for liver cirrhosis per GI and also right upper quadrant ultrasound. Diuretics on hold due to AKI.  On Last admission she had ultrasound-guided paracentesis yielding 2.4 L fluid.   Mild lactic acidosis improved suspect in the setting of liver dysfunction.  Noncompliant with meds/refusing meds at the SNF.  COPD pfts pending/ active smoker: Chest x-ray unremarkable, continue as needed oxygen and bronchodilators.  Thrombocytopenia platelet 100 K from 140K likely from cirrhosis.Monitor.  Hold Lovenox as she has burises on her legs.  GUR:KYHCWCBJSE 1.2 from 1.3,monitor closely.Will cont iv albumin q6h given low albumin.  [place]; DNR.Prognosis appears guarded,continues to be encephalopathic since her last discharge.  Palliative care consulted.  CT ABDOMEND reviewed as below: "1. Findings consistent with hepatic cirrhosis.Moderate ascites isnoted. 2. Bilateral nonobstructive nephrolithiasis. No hydronephrosis or renal obstruction is noted. 3. Large amount of stool is noted in the rectum consistent with impaction. 4. Stable 12 mm nodule is noted in left lower lobe laterally. This may represent low-grade bronchogenic carcinoma as described on the prior PET scan of June 21, 2018"  Manual disimpaction, dulcolax PR ordered. I called and updated the daughter. Pateint did not want to pursue cancer treatment on her lung and mouth as per step-daughter Step daughter was asking wether she will be able to go back to SNF ( and she plans to  hold bed at [facility] for 4 more days) and was asking if she may need to go  to hospice if does not improve. I answered all her questions- will need to see how she responds over coming days to decide her dispo and gi is following and will await their inputs as well.   DVT prophylaxis: Place and maintain sequential compression device Start: 01/26/20 1051 Code Status:   Code Status: DNR  Family Communication: plan of care discussed with nursing staff, gastroenterologist.  Daughter updated on admission.  Status is: Inpatient  Remains inpatient appropriate because:Altered mental status and Inpatient level of care appropriate due to severity of illness   Dispo: The patient is from: SNF              Anticipated d/c is to: SNF              Anticipated d/c date is: 3 days              Patient currently is not medically stable to d/c.  Diet Order            Diet 2 gram sodium Room service appropriate? Yes; Fluid consistency: Thin  Diet effective now                 Body mass index is 19.68 kg/m. Pressure Ulcer: skin team in rt arm.  Consultants:see note  Procedures:see note Microbiology:see note Blood Culture    Component Value Date/Time   SDES  01/25/2020 1108    URINE, CLEAN CATCH Performed at [facility], [place] [address], [place], [place] [address]    SPECREQUEST  01/25/2020 1108    NONE Performed at [facility], [place] [address], [place], [place] [address]    CULT  01/25/2020 1108    NO GROWTH Performed at [facility], [place] [address], [place], [place] [address]    REPTSTATUS 01/26/2020 FINAL 01/25/2020 1108    Other culture-see note  Medications: Scheduled Meds: . lactulose  20 g Oral BID  . nicotine  14 mg Transdermal Daily  . rifaximin  550 mg Oral BID   Continuous Infusions: . albumin human Stopped (01/25/20 1647)  . cefTRIAXone (ROCEPHIN)  IV 2 g (01/26/20 1003)  . potassium chloride 10 mEq (01/26/20 1110)    Antimicrobials: Anti-infectives (From admission, onward)   Start      Dose/Rate Route Frequency Ordered Stop   01/26/20 1230  rifaximin (XIFAXAN) tablet 550 mg     Discontinue     550 mg Oral 2 times daily 01/26/20 1143     01/26/20 1000  cefTRIAXone (ROCEPHIN) 2 g in sodium chloride 0.9 % 100 mL IVPB     Discontinue     2 g 200 mL/hr over 30 Minutes Intravenous Every 24 hours 01/25/20 2333     01/25/20 1200  cefTRIAXone (ROCEPHIN) 1 g in sodium chloride 0.9 % 100 mL IVPB        1 g 200 mL/hr over 30 Minutes Intravenous  Once 01/25/20 1151 01/25/20 1234       Objective: Vitals: Today's Vitals   01/26/20 0014 01/26/20 0411 01/26/20 0804 01/26/20 1017  BP: (!) 93/52 (!) 105/50 (!) 111/58   Pulse: 79 85 83   Resp: 16 18 16    Temp: (!) 97.4 F (36.3 C) (!) 97.3 F (36.3 C) 97.6 F (36.4 C)   TempSrc: Oral Oral Oral   SpO2: 100% 100% 99%   Weight:  Height:      PainSc:    0-No pain    Intake/Output Summary (Last 24 hours) at 01/26/2020 1202 Last data filed at 01/26/2020 0700 Gross per 24 hour  Intake 700 ml  Output 50 ml  Net 650 ml   Filed Weights   01/25/20 1059  Weight: 52 kg   Weight change:    Intake/Output from previous day: 07/16 0701 - 07/17 0700 In: 700 [IV Piggyback:700] Out: 50 [Urine:50] Intake/Output this shift: No intake/output data recorded.  Examination:  General exam: AAO x2, tremulous rt leg and rt side, NAD, weak appearing. HEENT:Oral mucosa moist, Ear/Nose WNL grossly,dentition normal. Respiratory system: bilaterally clear,no wheezing or crackles,no use of accessory muscle, non tender. Cardiovascular system: S1 & S2 +, regular, No JVD. Gastrointestinal system: Abdomen soft, NT,ND, BS+. Nervous System:Alert, awake, moving extremities and grossly nonfocal Extremities: No edema, distal peripheral pulses palpable.  Skin: No rashes,no icterus. MSK: Normal muscle bulk,tone, power  Data Reviewed: I have personally reviewed following labs and imaging studies CBC: Recent Labs  Lab 01/25/20 1108 01/26/20 0621    WBC 12.1* 12.5*  NEUTROABS 9.8*  --   HGB 11.5* 10.8*  HCT 34.8* 32.1*  MCV 105.5* 101.6*  PLT 146* 329*   Basic Metabolic Panel: Recent Labs  Lab 01/25/20 1108 01/26/20 0621  NA 137 136  K 2.5* 3.2*  CL 92* 94*  CO2 31 29  GLUCOSE 124* 81  BUN 25* 23  CREATININE 1.34* 1.20*  CALCIUM 7.6* 7.3*   GFR: Estimated Creatinine Clearance: 35.8 mL/min (A) (by C-G formula based on SCr of 1.2 mg/dL (H)). Liver Function Tests: Recent Labs  Lab 01/25/20 1108 01/26/20 0621  AST 75* 72*  ALT 45* 38  ALKPHOS 142* 124  BILITOT 6.8* 5.7*  PROT 6.0* 5.3*  ALBUMIN 2.8* 2.3*   Recent Labs  Lab 01/25/20 1108  LIPASE 46   Recent Labs  Lab 01/25/20 1052  AMMONIA 52*   Coagulation Profile: Recent Labs  Lab 01/25/20 1108  INR 1.4*   Cardiac Enzymes: No results for input(s): CKTOTAL, CKMB, CKMBINDEX, TROPONINI in the last 168 hours. BNP (last 3 results) No results for input(s): PROBNP in the last 8760 hours. HbA1C: No results for input(s): HGBA1C in the last 72 hours. CBG: No results for input(s): GLUCAP in the last 168 hours. Lipid Profile: No results for input(s): CHOL, HDL, LDLCALC, TRIG, CHOLHDL, LDLDIRECT in the last 72 hours. Thyroid Function Tests: No results for input(s): TSH, T4TOTAL, FREET4, T3FREE, THYROIDAB in the last 72 hours. Anemia Panel: No results for input(s): VITAMINB12, FOLATE, FERRITIN, TIBC, IRON, RETICCTPCT in the last 72 hours. Sepsis Labs: Recent Labs  Lab 01/25/20 1052 01/25/20 1520  LATICACIDVEN 2.1* 1.9    Recent Results (from the past 240 hour(s))  Urine culture     Status: None   Collection Time: 01/25/20 11:08 AM   Specimen: Urine, Clean Catch  Result Value Ref Range Status   Specimen Description   Final    URINE, CLEAN CATCH Performed at [facility], [place] [address], [place], [place] [address]    Special Requests   Final    NONE Performed at [facility], [place] [address]  [place], [place] [address]    Culture   Final    NO GROWTH Performed at [facility], [place] [address], [place], [place] [address]    Report Status 01/26/2020 FINAL  Final  SARS Coronavirus 2 by RT PCR (hospital order, performed in [facility] hospital lab) Nasopharyngeal Nasopharyngeal Swab     Status: None   Collection Time: 01/25/20 12:05 PM   Specimen: Nasopharyngeal Swab  Result Value Ref Range Status   SARS Coronavirus 2 NEGATIVE NEGATIVE Final    Comment: (NOTE) SARS-CoV-2 target nucleic acids are NOT DETECTED.  The SARS-CoV-2 RNA is generally detectable in upper and lower respiratory specimens during the acute phase of infection. The lowest concentration of SARS-CoV-2 viral copies this assay can detect is 250 copies / mL. A negative result does not preclude SARS-CoV-2 infection and should not be used as the sole basis for treatment or other patient management decisions.  A negative result may occur with improper specimen collection / handling, submission of specimen other than nasopharyngeal swab, presence of viral mutation(s) within the areas targeted by this assay, and inadequate number of viral copies (<250 copies / mL). A negative result must be combined with clinical observations, patient history, and epidemiological information.  Fact Sheet for Patients:   StrictlyIdeas.no  Fact Sheet for Healthcare Providers: BankingDealers.co.za  This test is not yet approved or  cleared by the [place] FDA and has been authorized for detection and/or diagnosis of SARS-CoV-2 by FDA under an Emergency Use Authorization (EUA).  This EUA will remain in effect (meaning this test can be used) for the duration of the COVID-19 declaration under Section 564(b)(1) of the Act, 21 U.S.C. section 360bbb-3(b)(1), unless the authorization is terminated or revoked sooner.  Performed at [facility], [place] [address] [place], [place] [address]       Radiology Studies: [facility] Chest Port 1 View  Result Date: 01/25/2020 CLINICAL DATA:  Altered mental status EXAM: PORTABLE CHEST 1 VIEW COMPARISON:  01/04/2020 FINDINGS: Unchanged elevation of the right hemidiaphragm. No acute appearing airspace opacity. The heart and mediastinum are unremarkable. Left shoulder reverse total arthroplasty. IMPRESSION: Unchanged elevation of the right hemidiaphragm. No acute appearing airspace opacity. Electronically Signed   By: Eddie Candle M.D.   On: 01/25/2020 12:20     LOS: 1 day   Antonieta Pert, MD [facility]  01/26/2020, 12:02 PM

## 2020-01-26 NOTE — Consult Note (Signed)
Consultation Note Date: 01/26/2020   Patient Name: [name]  DOB: 1950-11-15  MRN: [id number]  Age / Sex: 69 y.o., female  PCP: Sydney Low, MD Referring Physician: Antonieta Pert, MD  Reason for Consultation: Establishing goals of care  HPI/Patient Profile: 69 y.o. female  with past medical history of hypertension, right lung cancer status post chemotherapy in 2007, and ETOH abuse admitted on 01/25/2020 with acute alcoholic hepatitis, ascities, toxic metabolic encephalopathy, UTI, moderate protein calorie malnutrition, elevated LTF's, and ARF.   Patient and family face treatment option decisions, advanced directive decisions, and anticipatory care needs.  Clinical Assessment and Goals of Care: I have reviewed medical records including EPIC notes, labs and imaging. Received report from primary RN - stated the patient is not interested in eating or drinking. RN had no acute concerns.  Went to visit patient at bedside - no family present. Patient was lying in bed - oriented to self only. When asked where she was, [name] gave what seemed to be an address and when asked what year it was gave her birthday. She was not able to participate in complex medical conversation today. She did deny pain. No non-verbal gestures or signs of discomfort noted.  Met with [name] via phone to discuss diagnosis, prognosis, GOC, EOL wishes, disposition, and options.  I introduced Palliative Medicine as specialized medical care for people living with serious illness. It focuses on providing relief from the symptoms and stress of a serious illness. The goal is to improve quality of life for both the patient and the family.   We discussed a brief life review of the patient. [name] husband passed several years ago and she also has no living children. [name] is [name] of 30 years and the only  person who is involved in her medical care/decision making. [name] states she and the patient are very close. [name] lives in [place] but tries to manage [name] needs as best she can from where she lives.   [name] was living alone at home prior to her first hospitalization on January 04, 2020, which was a 10 day hospital stay. At home prior to hospitalization, someone came to check on her daily. [name] reports that [name] was not eating well, but drank chardonnay "all day." [name] also notes that [name] was not able to ambulate well and she was not able to get herself up to the bathroom or perform ADLs independently. [name] had someone do all her cooking and shopping. After the hospitalization from June 25-July 5 she was discharged to [facility] for rehab. During this time, [name] refused to eat or drink, was not able to feed herself, and developed worsening mental status. [name] states [facility] brought [name] back to the hospital due to her refusal to take medications, stomach distention, altered mental status, altered balance and coordination, and jaundice.    We discussed patient's current illness and what it means in the larger context of patient's on-going co-morbidities. [name]  was provided an update on the patient's current medical situation, most recent lab work, and plan of care as of today. I attempted to elicit values and goals of care important to the patient and family. The difference between aggressive medical intervention and comfort care was considered in light of the patient's goals of care. [name] is hopeful that [name] will improve so she can return to [place] to continue her rehabilitation. Sydney's ultimate goal is for [name] to return home. Discussed the possibility that [name] may not regain her previous functioning to be able to return home without 24hr assistance and that depending on how [name] recovers, rehab may not be the most appropriate discharge  plan for her. [name] is interested in watchful waiting before any final decisions are made, which is appropriate at this time. [name] had questions regarding discharge timing - explained that this may depend on the results of the CTs/US today, how her labs trend, and how she responds to treatment.   Discussed with patient/family the importance of continued conversation with the medical providers regarding overall plan of care and treatment options, ensuring decisions are within the context of the patient's values and GOCs.    Questions and concerns were addressed. The family was encouraged to call with questions or concerns.    NEXT OF KIN [name]/ [place]   SUMMARY OF RECOMMENDATIONS   -Continue current medical treatment -Continue DNR/DNI as previously documented -Step Hampton/Sydney's goal is for [name] to return to [place] for rehab. She is agreeable to watchful waiting with continued [place] conversations around hospital course -Recommend outpatient Palliative Care to follow if discharges back to SNF rehab -PMT will continue to follow holistically  Code Status/Advance Care Planning:  DNR  Palliative Prophylaxis:   Aspiration, Bowel Regimen, Delirium Protocol, Frequent Pain Assessment, Oral Care and Turn Reposition  Additional Recommendations (Limitations, Scope, Preferences):  Full Scope Treatment  Psycho-social/Spiritual:   Desire for further Chaplaincy support:no  Created space and opportunity for patient and family to express thoughts and feelings regarding patient's current medical situation.  Emotional support and therapeutic listening provided.  Prognosis:   < 6 months   Discharge Planning: [place] for rehab with Palliative care service follow-up depending on hospital course     Primary Diagnoses: Present on Admission: . Toxic metabolic encephalopathy . COPD pfts pending/ active smoker . Elevated LFTs . Malnutrition of moderate  degree . Alcoholism ([place]) . Hypokalemia   I have reviewed the medical record, interviewed the patient and family, and examined the patient. The following aspects are pertinent.  Past Medical History:  Diagnosis Date  . Anemia   . Cancer [facility]) 2007   lung cancer, right, chemo done  . Complication of anesthesia 2007   slow to awaken  . History of blood transfusion 2010  . Hypertension   . Occasional tremors    Social History   Socioeconomic History  . Marital status: Widowed    Spouse name: Not on file  . Number of children: Not on file  . Years of education: Not on file  . Highest education level: Not on file  Occupational History  . Not on file  Tobacco Use  . Smoking status: Current Some Day Smoker    Packs/day: 3.00    Years: 25.00    Pack years: 75.00    Types: Cigarettes  . Smokeless tobacco: Never Used  Substance and Sexual Activity  . Alcohol use: Yes    Alcohol/week: 7.0 standard drinks  Types: 7 Glasses of wine per week    Comment: 1 glass winde daily  . Drug use: No  . Sexual activity: Not on file  Other Topics Concern  . Not on file  Social History Narrative  . Not on file   Social Determinants of Health   Financial Resource Strain:   . Difficulty of Paying Living Expenses:   Food Insecurity:   . Worried About Charity fundraiser in the Last Year:   . Arboriculturist in the Last Year:   Transportation Needs:   . Film/video editor (Medical):   Marland Kitchen Lack of Transportation (Non-Medical):   Physical Activity:   . Days of Exercise per Week:   . Minutes of Exercise per Session:   Stress:   . Feeling of Stress :   Social Connections:   . Frequency of Communication with Friends and Family:   . Frequency of Social Gatherings with Friends and Family:   . Attends Religious Services:   . Active Member of Clubs or Organizations:   . Attends Archivist Meetings:   Marland Kitchen Marital Status:    Family History  Problem Relation Age of Onset  .  Cancer Mother        bladder  . Heart disease Father   . Diabetes Father    Scheduled Meds: . lactulose  20 g Oral BID  . mouth rinse  15 mL Mouth Rinse BID  . nicotine  14 mg Transdermal Daily  . rifaximin  550 mg Oral BID   Continuous Infusions: . albumin human    . cefTRIAXone (ROCEPHIN)  IV 2 g (01/26/20 1003)   PRN Meds:.albuterol, lactulose, ondansetron (ZOFRAN) IV, senna-docusate Medications Prior to Admission:  Prior to Admission medications   Medication Sig Start Date End Date Taking? Authorizing Provider  busPIRone (BUSPAR) 10 MG tablet Take 1 tablet (10 mg total) by mouth 3 (three) times daily. 08/25/18  Yes Shelly Coss, MD  escitalopram (LEXAPRO) 10 MG tablet Take 1 tablet (10 mg total) by mouth daily. 08/25/18  Yes Shelly Coss, MD  folic acid (FOLVITE) 1 MG tablet Take 1 tablet (1 mg total) by mouth daily. 01/14/20  Yes Little Ishikawa, MD  furosemide (LASIX) 20 MG tablet Take 1 tablet (20 mg total) by mouth daily. 01/14/20  Yes Little Ishikawa, MD  Multiple Vitamin (MULTIVITAMIN WITH MINERALS) TABS tablet Take 1 tablet by mouth daily. 01/14/20  Yes Little Ishikawa, MD  ondansetron (ZOFRAN) 4 MG tablet Take 4 mg by mouth every 6 (six) hours as needed for nausea or vomiting.   Yes [provider]  predniSONE (DELTASONE) 10 MG tablet Take 4 tablets (40 mg total) by mouth daily for 3 days, THEN 3 tablets (30 mg total) daily for 3 days, THEN 2 tablets (20 mg total) daily for 3 days, THEN 1 tablet (10 mg total) daily for 3 days. 01/14/20 01/26/20 Yes Little Ishikawa, MD  PRESCRIPTION MEDICATION Take 120 mLs by mouth 3 (three) times daily. Medpass   Yes [provider]  spironolactone (ALDACTONE) 25 MG tablet Take 0.5 tablets (12.5 mg total) by mouth daily. 01/14/20  Yes Little Ishikawa, MD  thiamine 100 MG tablet Take 1 tablet (100 mg total) by mouth daily. 01/14/20  Yes Little Ishikawa, MD  lactulose (CHRONULAC) 10 GM/15ML solution Take  30 mLs (20 g total) by mouth 2 (two) times daily. 01/14/20   Little Ishikawa, MD  PROAIR HFA 108 [phone] Base)  MCG/ACT inhaler Inhale 2 puffs into the lungs every 4 (four) hours as needed for wheezing or shortness of breath.  04/20/18   [provider]  senna-docusate (SENOKOT-S) 8.6-50 MG tablet Take 1 tablet by mouth at bedtime as needed for mild constipation. 01/14/20   Little Ishikawa, MD   Allergies  Allergen Reactions  . Codeine Other (See Comments)    Shakes and tremors  . Tape Other (See Comments)    Patient PREFERS paper tape   Review of Systems  Unable to perform ROS: Mental status change    Physical Exam Vitals and nursing note reviewed.  Constitutional:      General: She is not in acute distress.    Appearance: She is ill-appearing.  Pulmonary:     Effort: Pulmonary effort is normal. No respiratory distress.  Skin:    General: Skin is warm and dry.  Neurological:     Mental Status: She is alert. She is disoriented and confused.     Motor: Weakness present.  Psychiatric:        Cognition and Memory: Cognition is impaired. Memory is impaired.     Vital Signs: BP (!) 93/52 (BP Location: Left Arm)   Pulse (!) 110   Temp 97.8 F (36.6 C) (Oral)   Resp 16   Ht 5' 4" (1.626 m)   Wt 52 kg   SpO2 95%   BMI 19.68 kg/m  Pain Scale: 0-10   Pain Score: 0-No pain   SpO2: SpO2: 95 % O2 Device:SpO2: 95 % O2 Flow Rate: .   IO: Intake/output summary:   Intake/Output Summary (Last 24 hours) at 01/26/2020 1552 Last data filed at 01/26/2020 0700 Gross per 24 hour  Intake 100 ml  Output 50 ml  Net 50 ml    LBM:   Baseline Weight: Weight: 52 kg Most recent weight: Weight: 52 kg     Palliative Assessment/Data: PPS 20%     Time In: 1500 Time Out: 1610 Time Total: 70 minutes Greater than 50%  of this time was spent counseling and coordinating care related to the above assessment and plan.  Signed by: Lin Landsman, NP   Please contact Palliative  Medicine Team phone at [phone] for questions and concerns.  For individual provider: See Shea Evans

## 2020-01-27 ENCOUNTER — Inpatient Hospital Stay (HOSPITAL_COMMUNITY): Payer: Medicare Other

## 2020-01-27 DIAGNOSIS — G92 Toxic encephalopathy: Secondary | ICD-10-CM

## 2020-01-27 LAB — CBC
HCT: 23 % — ABNORMAL LOW (ref 36.0–46.0)
Hemoglobin: 7.7 g/dL — ABNORMAL LOW (ref 12.0–15.0)
MCH: 34.8 pg — ABNORMAL HIGH (ref 26.0–34.0)
MCHC: 33.5 g/dL (ref 30.0–36.0)
MCV: 104.1 fL — ABNORMAL HIGH (ref 80.0–100.0)
Platelets: 135 10*3/uL — ABNORMAL LOW (ref 150–400)
RBC: 2.21 MIL/uL — ABNORMAL LOW (ref 3.87–5.11)
RDW: 13.5 % (ref 11.5–15.5)
WBC: 11.1 10*3/uL — ABNORMAL HIGH (ref 4.0–10.5)
nRBC: 0 % (ref 0.0–0.2)

## 2020-01-27 LAB — MAGNESIUM: Magnesium: 1.4 mg/dL — ABNORMAL LOW (ref 1.7–2.4)

## 2020-01-27 LAB — COMPREHENSIVE METABOLIC PANEL
ALT: 30 U/L (ref 0–44)
AST: 51 U/L — ABNORMAL HIGH (ref 15–41)
Albumin: 3.2 g/dL — ABNORMAL LOW (ref 3.5–5.0)
Alkaline Phosphatase: 107 U/L (ref 38–126)
Anion gap: 15 (ref 5–15)
BUN: 26 mg/dL — ABNORMAL HIGH (ref 8–23)
CO2: 26 mmol/L (ref 22–32)
Calcium: 7.9 mg/dL — ABNORMAL LOW (ref 8.9–10.3)
Chloride: 99 mmol/L (ref 98–111)
Creatinine, Ser: 1.65 mg/dL — ABNORMAL HIGH (ref 0.44–1.00)
GFR calc Af Amer: 36 mL/min — ABNORMAL LOW (ref >60)
GFR calc non Af Amer: 31 mL/min — ABNORMAL LOW (ref >60)
Glucose, Bld: 99 mg/dL (ref 70–99)
Potassium: 3.5 mmol/L (ref 3.5–5.1)
Sodium: 140 mmol/L (ref 135–145)
Total Bilirubin: 4.7 mg/dL — ABNORMAL HIGH (ref 0.3–1.2)
Total Protein: 5.5 g/dL — ABNORMAL LOW (ref 6.5–8.1)

## 2020-01-27 LAB — PROTIME-INR
INR: 1.8 — ABNORMAL HIGH (ref 0.8–1.2)
Prothrombin Time: 20.1 seconds — ABNORMAL HIGH (ref 11.4–15.2)

## 2020-01-27 LAB — PHENYTOIN LEVEL, TOTAL: Phenytoin Lvl: 15.8 ug/mL (ref 10.0–20.0)

## 2020-01-27 LAB — PHOSPHORUS: Phosphorus: 2.7 mg/dL (ref 2.5–4.6)

## 2020-01-27 LAB — AMMONIA: Ammonia: 142 umol/L — ABNORMAL HIGH (ref 9–35)

## 2020-01-27 MED ORDER — SODIUM CHLORIDE 0.9 % IV SOLN
20.0000 mg/kg | Freq: Once | INTRAVENOUS | Status: AC
  Administered 2020-01-27: 1040 mg via INTRAVENOUS
  Filled 2020-01-27: qty 20

## 2020-01-27 MED ORDER — LEVETIRACETAM IN NACL 500 MG/100ML IV SOLN
500.0000 mg | Freq: Two times a day (BID) | INTRAVENOUS | Status: DC
  Administered 2020-01-28 (×2): 500 mg via INTRAVENOUS
  Filled 2020-01-27 (×2): qty 100

## 2020-01-27 MED ORDER — LACTULOSE 10 GM/15ML PO SOLN: 30.0000 g | Freq: Three times a day (TID) | ORAL | Status: DC

## 2020-01-27 MED ORDER — CHLORHEXIDINE GLUCONATE CLOTH 2 % EX PADS
6.0000 | MEDICATED_PAD | Freq: Every day | CUTANEOUS | Status: DC
  Administered 2020-01-28 – 2020-01-29 (×2): 6 via TOPICAL

## 2020-01-27 MED ORDER — PHENYTOIN SODIUM 50 MG/ML IJ SOLN
100.0000 mg | Freq: Three times a day (TID) | INTRAMUSCULAR | Status: DC
  Administered 2020-01-28 – 2020-01-31 (×10): 100 mg via INTRAVENOUS
  Filled 2020-01-27 (×11): qty 2

## 2020-01-27 MED ORDER — MAGNESIUM SULFATE 4 GM/100ML IV SOLN
4.0000 g | Freq: Once | INTRAVENOUS | Status: AC
  Administered 2020-01-27: 4 g via INTRAVENOUS
  Filled 2020-01-27: qty 100

## 2020-01-27 MED ORDER — FOLIC ACID 5 MG/ML IJ SOLN
1.0000 mg | Freq: Every day | INTRAMUSCULAR | Status: DC
  Administered 2020-01-27 – 2020-01-29 (×3): 1 mg via INTRAVENOUS
  Filled 2020-01-27 (×4): qty 0.2

## 2020-01-27 MED ORDER — LORAZEPAM 2 MG/ML IJ SOLN: 0.5000 mg | Freq: Once | INTRAMUSCULAR | Status: DC | PRN

## 2020-01-27 MED ORDER — SODIUM CHLORIDE 0.9 % IV SOLN: INTRAVENOUS | Status: DC

## 2020-01-27 MED ORDER — THIAMINE HCL 100 MG/ML IJ SOLN
500.0000 mg | Freq: Three times a day (TID) | INTRAVENOUS | Status: DC
  Administered 2020-01-27 – 2020-01-28 (×3): 500 mg via INTRAVENOUS
  Filled 2020-01-27 (×5): qty 5

## 2020-01-27 MED ORDER — LEVETIRACETAM IN NACL 1500 MG/100ML IV SOLN
1500.0000 mg | INTRAVENOUS | Status: AC
  Administered 2020-01-27: 1500 mg via INTRAVENOUS
  Filled 2020-01-27: qty 100

## 2020-01-27 MED ORDER — MORPHINE SULFATE (PF) 2 MG/ML IV SOLN
1.0000 mg | INTRAVENOUS | Status: DC | PRN
  Administered 2020-01-27 – 2020-01-28 (×3): 1 mg via INTRAVENOUS
  Filled 2020-01-27 (×3): qty 1

## 2020-01-27 MED ORDER — DEXAMETHASONE SODIUM PHOSPHATE 4 MG/ML IJ SOLN
2.0000 mg | INTRAMUSCULAR | Status: DC
  Administered 2020-01-27 – 2020-01-28 (×2): 2 mg via INTRAVENOUS
  Filled 2020-01-27 (×2): qty 1

## 2020-01-27 NOTE — Progress Notes (Signed)
MEDICATION RELATED CONSULT NOTE - INITIAL   Pharmacy Consult for Phenytoin Indication: focal status epilepticus   Allergies  Allergen Reactions  . Codeine Other (See Comments)    Shakes and tremors  . Tape Other (See Comments)    Patient PREFERS paper tape    Patient Measurements: Height: 5\' 4"  (162.6 cm) Weight: 52 kg (114 lb 10.2 oz) IBW/kg (Calculated) : 54.7  Vital Signs:   Intake/Output from previous day: No intake/output data recorded. Intake/Output from this shift: No intake/output data recorded.  Labs: Recent Labs    01/25/20 1108 01/26/20 0621 01/27/20 0700 01/27/20 1615  WBC 12.1* 12.5*  --  11.1*  HGB 11.5* 10.8*  --  7.7*  HCT 34.8* 32.1*  --  23.0*  PLT 146* 100*  --  135*  CREATININE 1.34* 1.20* 1.65*  --   MG  --   --  1.4*  --   PHOS  --   --  2.7  --   ALBUMIN 2.8* 2.3* 3.2*  --   PROT 6.0* 5.3* 5.5*  --   AST 75* 72* 51*  --   ALT 45* 38 30  --   ALKPHOS 142* 124 107  --   BILITOT 6.8* 5.7* 4.7*  --    Estimated Creatinine Clearance: 26 mL/min (A) (by C-G formula based on SCr of 1.65 mg/dL (H)).   Microbiology: Recent Results (from the past 720 hour(s))  SARS Coronavirus 2 by RT PCR (hospital order, performed in [facility] hospital lab) Nasopharyngeal Nasopharyngeal Swab     Status: None   Collection Time: 01/04/20  4:05 PM   Specimen: Nasopharyngeal Swab  Result Value Ref Range Status   SARS Coronavirus 2 NEGATIVE NEGATIVE Final    Comment: (NOTE) SARS-CoV-2 target nucleic acids are NOT DETECTED.  The SARS-CoV-2 RNA is generally detectable in upper and lower respiratory specimens during the acute phase of infection. The lowest concentration of SARS-CoV-2 viral copies this assay can detect is 250 copies / mL. A negative result does not preclude SARS-CoV-2 infection and should not be used as the sole basis for treatment or other patient management decisions.  A negative result may occur with improper specimen collection / handling,  submission of specimen other than nasopharyngeal swab, presence of viral mutation(s) within the areas targeted by this assay, and inadequate number of viral copies (<250 copies / mL). A negative result must be combined with clinical observations, patient history, and epidemiological information.  Fact Sheet for Patients:   StrictlyIdeas.no  Fact Sheet for Healthcare Providers: BankingDealers.co.za  This test is not yet approved or  cleared by the [place] FDA and has been authorized for detection and/or diagnosis of SARS-CoV-2 by FDA under an Emergency Use Authorization (EUA).  This EUA will remain in effect (meaning this test can be used) for the duration of the COVID-19 declaration under Section 564(b)(1) of the Act, 21 U.S.C. section 360bbb-3(b)(1), unless the authorization is terminated or revoked sooner.  Performed at [facility] [place] [address] [place], [place] [address]   Urine culture     Status: Abnormal   Collection Time: 01/05/20  6:00 AM   Specimen: Urine, Random  Result Value Ref Range Status   Specimen Description   Final    URINE, RANDOM Performed at [place] [address] [place], [place] [address]    Special Requests   Final    NONE Performed at [facility], [place] [address] [place], [place] [address]  Culture (A)  Final    30,000 COLONIES/mL MULTIPLE SPECIES PRESENT, SUGGEST RECOLLECTION   Report Status 01/06/2020 FINAL  Final  Gram stain     Status: None   Collection Time: 01/05/20 12:21 PM   Specimen: Fluid  Result Value Ref Range Status   Specimen Description FLUID  Final   Special Requests NONE  Final   Gram Stain   Final    WBC PRESENT, PREDOMINANTLY PMN NO ORGANISMS SEEN CYTOSPIN SMEAR Performed at [facility], [place] [address] [place], [place] [address]    Report Status 01/05/2020 FINAL  Final  Culture, body  fluid-bottle     Status: None   Collection Time: 01/05/20 12:21 PM   Specimen: Fluid  Result Value Ref Range Status   Specimen Description FLUID  Final   Special Requests NONE  Final   Culture   Final    NO GROWTH 5 DAYS Performed at [place] [address], [place] , [place] [address]    Report Status 01/10/2020 FINAL  Final  Fungus Culture With Stain     Status: None (Preliminary result)   Collection Time: 01/05/20  1:00 PM   Specimen: Abdomen; Peritoneal Fluid  Result Value Ref Range Status   Fungus Stain Final report  Final    Comment: (NOTE) Performed At: [facility] [place], [place] [id number] Rush Farmer MD [id number]    Fungus (Mycology) Culture PENDING  Incomplete   Fungal Source PERITONEAL  Final    Comment: Performed at [facility], [place] [address], [place], [place] [address]  Fungus Culture Result     Status: None   Collection Time: 01/05/20  1:00 PM  Result Value Ref Range Status   Result 1 Comment  Final    Comment: (NOTE) KOH/Calcofluor preparation:  no fungus observed. Performed At: [facility] [place], [place] [id number] Rush Farmer MD [id number]   Culture, blood (routine x 2)     Status: None (Preliminary result)   Collection Time: 01/25/20 11:08 AM   Specimen: BLOOD  Result Value Ref Range Status   Specimen Description   Final    BLOOD LEFT ANTECUBITAL Performed at [place] [address] [place], [place] [address]    Special Requests   Final    BOTTLES DRAWN AEROBIC AND ANAEROBIC Blood Culture adequate volume Performed at [place] [address] [place], [place] [address]    Culture   Final    NO GROWTH 2 DAYS Performed at [place] [address] [place], [place] [address]    Report Status PENDING  Incomplete  Urine culture     Status: None   Collection Time: 01/25/20 11:08 AM   Specimen: Urine,  Clean Catch  Result Value Ref Range Status   Specimen Description   Final    URINE, CLEAN CATCH Performed at [facility], [place] [address], [place], [place] [address]    Special Requests   Final    NONE Performed at [facility], [place] [address], [place],  [address]    Culture   Final    NO GROWTH Performed at [facility], [place] [address] [place],  [address]    Report Status 01/26/2020 FINAL  Final  SARS Coronavirus 2 by RT PCR (hospital order, performed in [facility] hospital lab) Nasopharyngeal Nasopharyngeal Swab     Status: None   Collection Time: 01/25/20  12:05 PM   Specimen: Nasopharyngeal Swab  Result Value Ref Range Status   SARS Coronavirus 2 NEGATIVE NEGATIVE Final    Comment: (NOTE) SARS-CoV-2 target nucleic acids are NOT DETECTED.  The SARS-CoV-2 RNA is generally detectable in upper and lower respiratory specimens during the acute phase of infection. The lowest concentration of SARS-CoV-2 viral copies this assay can detect is 250 copies / mL. A negative result does not preclude SARS-CoV-2 infection and should not be used as the sole basis for treatment or other patient management decisions.  A negative result may occur with improper specimen collection / handling, submission of specimen other than nasopharyngeal swab, presence of viral mutation(s) within the areas targeted by this assay, and inadequate number of viral copies (<250 copies / mL). A negative result must be combined with clinical observations, patient history, and epidemiological information.  Fact Sheet for Patients:   StrictlyIdeas.no  Fact Sheet for Healthcare Providers: BankingDealers.co.za  This test is not yet approved or  cleared by the [place] FDA and has been authorized for detection and/or diagnosis of SARS-CoV-2 by FDA under an Emergency Use Authorization (EUA).  This EUA will  remain in effect (meaning this test can be used) for the duration of the COVID-19 declaration under Section 564(b)(1) of the Act, 21 U.S.C. section 360bbb-3(b)(1), unless the authorization is terminated or revoked sooner.  Performed at [facility], [place] [address], [place], [place] [address]     Medical History: Past Medical History:  Diagnosis Date  . Anemia   . Cancer [facility]) 2007   lung cancer, right, chemo done  . Complication of anesthesia 2007   slow to awaken  . History of blood transfusion 2010  . Hypertension   . Occasional tremors     Medications:  Scheduled:  . Chlorhexidine Gluconate Cloth  6 each Topical Daily  . dexamethasone (DECADRON) injection  2 mg Intravenous Q24H  . folic acid  1 mg Intravenous Daily  . lactulose  30 g Oral TID  . mouth rinse  15 mL Mouth Rinse BID  . nicotine  14 mg Transdermal Daily  . rifaximin  550 mg Oral BID   Infusions:  . sodium chloride 75 mL/hr at 01/27/20 0813  . cefTRIAXone (ROCEPHIN)  IV 2 g (01/27/20 0924)  . [START ON 01/28/2020] levETIRAcetam    . thiamine injection 500 mg (01/27/20 1649)   PRN: albuterol, lactulose, morphine injection, ondansetron (ZOFRAN) IV, senna-docusate  Assessment: Pharmacy consulted to dose phenytoin for this 69 yo female with PMH alcoholic cirrhosis with ascites who was admitted from SNF for increased confusion, increased jaundice, edema and refusing medications.  Neurology consulted when patient developed right arm twitching/jerking on 7/18, IV keppra was also initiated.  Pertinent labs: SCr 1.65, albumin 3.2, AST/ALT 51/3, tbili 4.7, INR 1.8  Goal of Therapy:  Phenytoin levels 10-20 mcg/ml  Plan:  Fosphenytoin 20mg /kg IV given at 20:08 per Neurology Check phenytoin level in 2hrs, if therapeutic, will begin phenytoin 100mg  IV q8h (~6mg /kg/day) Follow clinical progress, re-check levels as needed  Peggyann Juba, PharmD, BCPS Pharmacy: [phone] 01/27/2020,8:43  PM

## 2020-01-27 NOTE — Progress Notes (Addendum)
PROGRESS NOTE  AVREY Hampton EQA:[id number] DOB: [birth year] DOA: 01/25/2020 PCP: Wenda Low, MD   Chief Complaint  Patient presents with   Altered Mental Status    Brief Narrative: As per admitting : 69 y.o. female with medical history significant of alcoholic cirrhosis with ascites, hx of etoh abuse, tobacco abuse, hx lung cancer s/p chemo, HTN presents to ED from SNF for increased confusion, refusing meds and increased jaundice, edema.  She was discharged recently on 7/5, admitted 01/04/20:following admission for treatment of ETOH hepatitis with ascites, encephalopathy/?wernicke's- felt unsafe for home due to confusion and sent to SNF. prior to last admit heavy etoh use 2 bottles wine/daily, living by herself. Since discharge to SNF,pt has been refusing meds and food per daughter and noted to be more edematous after not taking meds.  ED Course:In the ED,pt found to have K of 2.5, Cr of 1.34, ammonia of 52. Total bilirubin was elevated at 6.8 (from 2.9 at discharge). UA was notable for 6-10 WBC's with pos nitrites. Cultures were obtained and pt started on empiric rocephin.Hospitalist consulted for consideration for admission. Patient was admitted seen by gastroenterologist underwent ultrasound and CT abdomen that showed evidence of cirrhosis, stable 12 mm nodule low-grade bronchogenic carcinoma, stool impaction.  Subjective: Eyes closed, able to tel me her name, her DOB- unable to follow other commands, not orieted to place or situation On RA. Successfully disimpacted 7/17 Overnight afebrile, blood pressure stable. Creatinine has bumped up 1.6- did bladder scan-urine retention noted in this am 547 ml- straight cath ordered patient more confused today.  Assessment & Plan:  Acute encephalopathy/suspected Hepatic encephalopathy in the setting of cirrhosis/hepatitis and UTI, refusal to take medicine including lactulose/rifaximin at the facility:On last d/c 01/14/20-alert to person and place  only. There was concern for wernicke's encephalopathy-felt unsafe for home due to confusion and sent to SNF, suspected mentation may not improve. Prior to last admit heavy etoh use 2 bottles wine/daily, living by herself.  Continue on supportive care, treatment of UTI with ceftriaxone, lactulose/rifaximin p.o. if unable to take p.o. continue lactulose enema. Ammonia and INR in am.GI input appreciated on board.  CT head 7/17: chronic ischemic white matter disease: no mass lesion hemorrhage or acute infarct. Pt having  jerks on Rt Side- unable to move Rt side UE/LE appears flaccid today, was weak yesterday too but difficult neuro exam as she is not following commands. ? asterisix but since more of unilateral ? Seizure. Discussed with Dr Rory Percy- Mri brain ordered stat, [facility] to [facility], will order on thiamin 500 mg TID. Keep NPO.  LNL:GXQJJHERDE admission 1.30,was down to 1.2 and further increasing now, despite IV albumin,will add gentle IV fluid hydration.  Avoid nephrotoxic medication.Monitor urine output.Maintain BP.Did bladder scan- noted bladder scan had 547 ml In scan,I/O cath failed x2-only small amount of urine- RN reporting having obstruction. Renal US no hydronephrosis. Discussed with Dr Alyson Ingles from urology-he advised to get ct pelvis before putting in foley. Recent Labs  Lab 01/25/20 1108 01/26/20 0621 01/27/20 0700  BUN 25* 23 26*  CREATININE 1.34* 1.20* 1.65*   Hypomagnesemia 1.4, will replete iv w/ 4 gm mag rider.  Hypokalemia resolved.  Acute lower UTI YCX:KGYJEHUD empiric Rocephin. Urine culture/blood culture negative so far  Alcoholic liver disease with Hypoalbuminemia,coagulopathic with INR worsening 1.4> 1.8,ascites, moderately elevated ammonia 52 -CT abdomen shows hepatic cirrhosis, moderate ascites, stool impaction Diuretics-lasix/aldactone is on hold due to AKI.On Last admission she had ultrasound-guided paracentesis yielding 2.4 L fluid.  LFTs slightly better  total bili 4.7.  Monitor serial lfts. Recent Labs  Lab 01/25/20 1108 01/26/20 0621 01/27/20 0700  AST 75* 72* 51*  ALT 45* 38 30  ALKPHOS 142* 124 107  BILITOT 6.8* 5.7* 4.7*  PROT 6.0* 5.3* 5.5*  ALBUMIN 2.8* 2.3* 3.2*  INR 1.4*  --  1.8*   Mild lactic acidosis has resolved.   Noncompliant with meds/refusing meds at the SNF.  COPD pfts pending/ active smoker: Chest x-ray unremarkable, on RA. As per med rec- was discharged on steroid taper-was supposed to start prednisone 10 mg from 3 more days then stop-NPO now- will switch to equivalent iv decadron 3 more doses to wean off. Cont as needed bronchodilators.  Thrombocytopenia platelet downtrending, holding anticoagulation, monitor.Suspect due to her liver cirrhosis.   Recent Labs  Lab 01/25/20 1108 01/26/20 0621  PLT 146* 100*   Fecal impaction/constipation was manually disimpacted and also received Dulcolax suppository 7/17.  Nursing reports she had 3 good bowel movements.  Low-grade bronchogenic carcinoma noted in the CT scan and also history of mouth tumor patient stated reports patient did not want to pursue further management of those carcinoma/tumor when they were found few yrs ago.  Macrocytic anemia suspect from alcohol abuse.Monitor H&H.  Essential Tremors hx-as per daughter she is on pimodine for that. she has tremors on both of her hand and at times voice gets shaky from her tremors as per daughter.  GOC:DNR.Prognosis appears guarded. palliative care following along.  Daughter understands overall condition.Spoke to her step daughter 7/17- Step daughter was asking wether she will be able to go back to SNF ( and she plans to hold bed at [facility] for 4 more days) and was asking if she may need to go to hospice if does not improve.   Today I called her and updated her conditions and she uderstands the overall pricture.  DVT prophylaxis: Place and maintain sequential compression device Start: 01/26/20 1051 lovenox held due to dropping  platelet count and with INR benig up Code Status:   Code Status: DNR  Family Communication: plan of care discussed with pt's daughter in length today also.  Status is: Inpatient  Remains inpatient appropriate because:Altered mental status and Inpatient level of care appropriate due to severity of illness patient remains confused.  Dispo: The patient is from: SNF              Anticipated d/c is to: SNF/TBD              Anticipated d/c date is: >3 days              Patient currently is not medically stable to d/c.  Diet Order            Diet NPO time specified Except for: Other (See Comments)  Diet effective now                 Body mass index is 19.68 kg/m. Pressure Ulcer: skin team in rt arm.  Consultants:see note  Procedures:  CT ABDOMEND reviewed as below: "1. Findings consistent with hepatic cirrhosis.Moderate ascites isnoted. 2. Bilateral nonobstructive nephrolithiasis. No hydronephrosis or renal obstruction is noted. 3. Large amount of stool is noted in the rectum consistent with impaction. 4. Stable 12 mm nodule is noted in left lower lobe laterally. This may represent low-grade bronchogenic carcinoma as described on the prior PET scan of June 21, 2018"  Microbiology:see note Blood Culture    Component Value Date/Time   SDES  01/25/2020 1108  BLOOD LEFT ANTECUBITAL Performed at [facility] [address] [place], [place] [address]    SDES  01/25/2020 1108    URINE, CLEAN CATCH Performed at [facility], [place] [address] [place], [place] [address]    SPECREQUEST  01/25/2020 1108    BOTTLES DRAWN AEROBIC AND ANAEROBIC Blood Culture adequate volume Performed at [facility] [address], [place], [place] [address]    SPECREQUEST  01/25/2020 1108    NONE Performed at [facility], [place] [address] [place], [place] [address]    CULT  01/25/2020 1108    NO GROWTH 2  DAYS Performed at [facility], [place] [address] [place], [place] [address]    CULT  01/25/2020 1108    NO GROWTH Performed at [facility], [place] [address], [place], [place] [address]    REPTSTATUS PENDING 01/25/2020 1108   REPTSTATUS 01/26/2020 FINAL 01/25/2020 1108    Other culture-see note  Medications: Scheduled Meds:  lactulose  30 g Oral TID   mouth rinse  15 mL Mouth Rinse BID   nicotine  14 mg Transdermal Daily   rifaximin  550 mg Oral BID   Continuous Infusions:  sodium chloride 75 mL/hr at 01/27/20 0813   cefTRIAXone (ROCEPHIN)  IV 2 g (01/27/20 0924)   levETIRAcetam     [START ON 01/28/2020] levETIRAcetam     thiamine injection      Antimicrobials: Anti-infectives (From admission, onward)   Start     Dose/Rate Route Frequency Ordered Stop   01/26/20 1230  rifaximin (XIFAXAN) tablet 550 mg     Discontinue     550 mg Oral 2 times daily 01/26/20 1143     01/26/20 1000  cefTRIAXone (ROCEPHIN) 2 g in sodium chloride 0.9 % 100 mL IVPB     Discontinue     2 g 200 mL/hr over 30 Minutes Intravenous Every 24 hours 01/25/20 2333     01/25/20 1200  cefTRIAXone (ROCEPHIN) 1 g in sodium chloride 0.9 % 100 mL IVPB        1 g 200 mL/hr over 30 Minutes Intravenous  Once 01/25/20 1151 01/25/20 1234       Objective: Vitals: Today's Vitals   01/26/20 2050 01/27/20 0144 01/27/20 0529 01/27/20 0742  BP: (!) 107/91 116/69 118/69   Pulse: (!) 115 (!) 106 (!) 107   Resp: 19 19 18    Temp: 98 F (36.7 C) 97.8 F (36.6 C) 98 F (36.7 C)   TempSrc: Oral Oral Oral   SpO2: 94% 94% 94%   Weight:      Height:      PainSc:    0-No pain   No intake or output data in the 24 hours ending 01/27/20 1532 Filed Weights   01/25/20 1059  Weight: 52 kg   Weight change:    Intake/Output from previous day: No intake/output data recorded. Intake/Output this shift: No intake/output data recorded.  Examination:  General exam:AAOx1,older for age, on RA,  confused HEENT:Oral mucosa moist, Ear/Nose WNL grossly, dentition normal. Respiratory system: bilaterally clear,no wheezing or crackles,no use of accessory muscle Cardiovascular system: S1 & S2 +, No JVD. Gastrointestinal system:Abdomen soft,distended,non tender,BS+. Nervous System:eyes closed, able to open them briefly, able to tell her name  This am but progressively more confused,weakness noted on rt arm/leg and appears flaccid.Jerks + on rt side but not following commands. Extremities:No edema, distal peripheral pulses palpable.  Skin:skin bruises +,icterus+. LSL:HTDS muscle  bulk,tone, power  Data Reviewed: I have personally reviewed following labs and imaging studies CBC: Recent Labs  Lab 01/25/20 1108 01/26/20 0621  WBC 12.1* 12.5*  NEUTROABS 9.8*  --   HGB 11.5* 10.8*  HCT 34.8* 32.1*  MCV 105.5* 101.6*  PLT 146* 128*   Basic Metabolic Panel: Recent Labs  Lab 01/25/20 1108 01/26/20 0621 01/27/20 0700  NA 137 136 140  K 2.5* 3.2* 3.5  CL 92* 94* 99  CO2 31 29 26   GLUCOSE 124* 81 99  BUN 25* 23 26*  CREATININE 1.34* 1.20* 1.65*  CALCIUM 7.6* 7.3* 7.9*  MG  --   --  1.4*  PHOS  --   --  2.7   GFR: Estimated Creatinine Clearance: 26 mL/min (A) (by C-G formula based on SCr of 1.65 mg/dL (H)). Liver Function Tests: Recent Labs  Lab 01/25/20 1108 01/26/20 0621 01/27/20 0700  AST 75* 72* 51*  ALT 45* 38 30  ALKPHOS 142* 124 107  BILITOT 6.8* 5.7* 4.7*  PROT 6.0* 5.3* 5.5*  ALBUMIN 2.8* 2.3* 3.2*   Recent Labs  Lab 01/25/20 1108  LIPASE 46   Recent Labs  Lab 01/25/20 1052  AMMONIA 52*   Coagulation Profile: Recent Labs  Lab 01/25/20 1108 01/27/20 0700  INR 1.4* 1.8*   Cardiac Enzymes: No results for input(s): CKTOTAL, CKMB, CKMBINDEX, TROPONINI in the last 168 hours. BNP (last 3 results) No results for input(s): PROBNP in the last 8760 hours. HbA1C: No results for input(s): HGBA1C in the last 72 hours. CBG: No results for input(s): GLUCAP  in the last 168 hours. Lipid Profile: No results for input(s): CHOL, HDL, LDLCALC, TRIG, CHOLHDL, LDLDIRECT in the last 72 hours. Thyroid Function Tests: No results for input(s): TSH, T4TOTAL, FREET4, T3FREE, THYROIDAB in the last 72 hours. Anemia Panel: No results for input(s): VITAMINB12, FOLATE, FERRITIN, TIBC, IRON, RETICCTPCT in the last 72 hours. Sepsis Labs: Recent Labs  Lab 01/25/20 1052 01/25/20 1520  LATICACIDVEN 2.1* 1.9    Recent Results (from the past 240 hour(s))  Culture, blood (routine x 2)     Status: None (Preliminary result)   Collection Time: 01/25/20 11:08 AM   Specimen: BLOOD  Result Value Ref Range Status   Specimen Description   Final    BLOOD LEFT ANTECUBITAL Performed at [facility], [place] [address], [place], [place] [address]    Special Requests   Final    BOTTLES DRAWN AEROBIC AND ANAEROBIC Blood Culture adequate volume Performed at [facility] [address] [place], [place] [address]    Culture   Final    NO GROWTH 2 DAYS Performed at [facility] [address], [place], [place] [address]    Report Status PENDING  Incomplete  Urine culture     Status: None   Collection Time: 01/25/20 11:08 AM   Specimen: Urine, Clean Catch  Result Value Ref Range Status   Specimen Description   Final    URINE, CLEAN CATCH Performed at [facility], [place] [address], [place], [place] [address]    Special Requests   Final    NONE Performed at [facility], [place] [address], [place], [place] [address]    Culture   Final    NO GROWTH Performed at [facility], [place] [address] [place], [place] [address]    Report Status 01/26/2020 FINAL  Final  SARS Coronavirus 2 by RT PCR (hospital order, performed in  [facility] hospital lab) Nasopharyngeal Nasopharyngeal Swab     Status: None   Collection Time: 01/25/20 12:05 PM   Specimen: Nasopharyngeal Swab  Result Value Ref  Range Status   SARS Coronavirus 2 NEGATIVE NEGATIVE Final    Comment: (NOTE) SARS-CoV-2 target nucleic acids are NOT DETECTED.  The SARS-CoV-2 RNA is generally detectable in upper and lower respiratory specimens during the acute phase of infection. The lowest concentration of SARS-CoV-2 viral copies this assay can detect is 250 copies / mL. A negative result does not preclude SARS-CoV-2 infection and should not be used as the sole basis for treatment or other patient management decisions.  A negative result may occur with improper specimen collection / handling, submission of specimen other than nasopharyngeal swab, presence of viral mutation(s) within the areas targeted by this assay, and inadequate number of viral copies (<250 copies / mL). A negative result must be combined with clinical observations, patient history, and epidemiological information.  Fact Sheet for Patients:   StrictlyIdeas.no  Fact Sheet for Healthcare Providers: BankingDealers.co.za  This test is not yet approved or  cleared by the [place] FDA and has been authorized for detection and/or diagnosis of SARS-CoV-2 by FDA under an Emergency Use Authorization (EUA).  This EUA will remain in effect (meaning this test can be used) for the duration of the COVID-19 declaration under Section 564(b)(1) of the Act, 21 U.S.C. section 360bbb-3(b)(1), unless the authorization is terminated or revoked sooner.  Performed at [facility], [place] [address] [place],  [address]       Radiology Studies: CT ABDOMEN PELVIS WO CONTRAST  Result Date: 01/26/2020 CLINICAL DATA:  Chronic liver disease. EXAM: CT ABDOMEN AND PELVIS WITHOUT CONTRAST TECHNIQUE: Multidetector CT imaging of the abdomen and pelvis was performed following the standard protocol without IV contrast. COMPARISON:  August 13, 2018.  June 21, 2018. FINDINGS: Lower chest: Small left  pleural effusion is noted. Stable 12 mm nodule is noted in left lower lobe laterally Hepatobiliary: No gallstones are noted. No biliary dilatation is noted. The liver is small most consistent with hepatic cirrhosis. No definite focal hepatic abnormality is noted on these unenhanced images. Pancreas: Calcifications are noted in pancreatic head suggesting chronic pancreatitis. No definite acute abnormality or ductal dilatation is noted. Spleen: Normal in size without focal abnormality. Adrenals/Urinary Tract: Adrenal glands appear normal. Bilateral nonobstructive nephrolithiasis is noted. No hydronephrosis or renal obstruction is noted. Urinary bladder is decompressed. Stomach/Bowel: The stomach appears normal. Large amount of stool is noted in the rectum consistent with impaction. No other abnormal bowel dilatation is noted. The appendix is unremarkable. Vascular/Lymphatic: Aortic atherosclerosis. No enlarged abdominal or pelvic lymph nodes. Reproductive: Uterus and bilateral adnexa are unremarkable. Other: Moderate ascites is noted, most prominently seen around the liver and in the pelvis. Musculoskeletal: No acute or significant osseous findings. IMPRESSION: 1. Findings consistent with hepatic cirrhosis. Moderate ascites is noted. 2. Bilateral nonobstructive nephrolithiasis. No hydronephrosis or renal obstruction is noted. 3. Large amount of stool is noted in the rectum consistent with impaction. 4. Stable 12 mm nodule is noted in left lower lobe laterally. This may represent low-grade bronchogenic carcinoma as described on the prior PET scan of June 21, 2018. Aortic Atherosclerosis (ICD10-I70.0). Electronically Signed   By: Marijo Conception M.D.   On: 01/26/2020 15:42   CT HEAD WO CONTRAST  Result Date: 01/26/2020 CLINICAL DATA:  Confusion, cephalopathy. EXAM: CT HEAD WITHOUT CONTRAST TECHNIQUE: Contiguous axial images were obtained from the base of  the skull through the vertex without intravenous contrast.  However, exam is somewhat limited due to patient motion artifact. COMPARISON:  August 01, 2008. FINDINGS: Brain: Mild chronic ischemic white matter disease is noted. No mass effect or midline shift is noted. Ventricular size is within normal limits. There is no evidence of mass lesion, hemorrhage or acute infarction. Vascular: No hyperdense vessel or unexpected calcification. Skull: Normal. Negative for fracture or focal lesion. Sinuses/Orbits: No acute finding. Other: None. IMPRESSION: Mild chronic ischemic white matter disease. No acute intracranial abnormality seen. However, exam is limited due to persistent patient motion artifact. Electronically Signed   By: Marijo Conception M.D.   On: 01/26/2020 12:17   US RENAL  Result Date: 01/27/2020 CLINICAL DATA:  Acute kidney injury. EXAM: RENAL / URINARY TRACT ULTRASOUND COMPLETE COMPARISON:  CT abdomen and pelvis 01/26/2020. FINDINGS: Right Kidney: Renal measurements: 10.6 x 4.3 x 5.7 cm = volume: 138.1 mL . Echogenicity within normal limits. Small stone seen on CT are not visible on this exam. No mass or hydronephrosis visualized. Left Kidney: Renal measurements: 10.8 x 5.3 x 4.8 cm = volume: 141.5 mL. Echogenicity within normal limits. Small stones seen on CT are not visible on this exam. No mass or hydronephrosis visualized. Bladder: Appears normal for degree of bladder distention. Other: Cirrhotic liver and ascites are noted as seen on prior CT. IMPRESSION: Negative for hydronephrosis or acute abnormality. Cirrhotic liver and ascites is seen on CT. Electronically Signed   By: Inge Rise M.D.   On: 01/27/2020 12:31   US Abdomen Limited RUQ  Result Date: 01/26/2020 CLINICAL DATA:  Abnormal liver function tests. EXAM: ULTRASOUND ABDOMEN LIMITED RIGHT UPPER QUADRANT COMPARISON:  None. FINDINGS: Gallbladder: The gallbladder is normally distended. There is a moderate amount of gallbladder sludge. Borderline gallbladder wall thickening of 3.4 mm. Given the  presence of ascites, this mild thickening may be reactive. Common bile duct: Diameter: 2.3 mm Liver: Increased echogenicity and heterogeneous echotexture. 7 mm benign-appearing sonographic right liver cyst is noted. 9 mm benign-appearing sonographically left liver cyst is noted. Portal vein is patent on color Doppler imaging with normal direction of blood flow towards the liver. Other: Moderate to large amount of perihepatic ascites. IMPRESSION: Increased echogenicity and heterogeneous echotexture of the liver, consistent with chronic liver disease. Moderate to large amount of perihepatic ascites. Gallbladder sludge with borderline thickening of the gallbladder wall, possibly reactive to the presence of ascites. Electronically Signed   By: Fidela Salisbury M.D.   On: 01/26/2020 16:08     LOS: 2 days   Antonieta Pert, MD [facility]  01/27/2020, 3:32 PM

## 2020-01-27 NOTE — Progress Notes (Signed)
Order received from Dr. Lupita Leash for a foley cath. Pt with urinary retention and bladder scan earlier of 547 ml of urine.

## 2020-01-27 NOTE — Progress Notes (Signed)
PT Cancellation Note  Patient Details Name: [name] MRN: [id number] DOB: [birth year]   Cancelled Treatment:      Reason Eval/Treat Not Completed: Medical issues which prohibited therapy Patient presenting with rhythmic myoclonus of right lower extremity and side (not upper extremity, however) and per nursing patient weaker today. Patient's right upper extremity significantly weak though some tone - a catch and release noted in bicep flexion. MD ordering MRI of head. Will hold evaluation for now to await MRI results and any other diagnostics.   Annita Ratliff 01/27/2020, 5:10 PM

## 2020-01-27 NOTE — Progress Notes (Signed)
OT Cancellation Note  Patient Details Name: [name] MRN: [id number] DOB: 1951-06-21   Cancelled Treatment:    Reason Eval/Treat Not Completed: Medical issues which prohibited therapy Patient presenting with rhythmic myoclonus of right lower extremity and side (not upper extremity, however) and per nursing patient weaker today. Patient's right upper extremity significantly weak though some tone - a catch and release noted in bicep flexion. MD ordering MRI of head. Will hold evaluation for now to await MRI results and any other diagnostics.  [name] 01/27/2020, 2:50 PM

## 2020-01-27 NOTE — Consult Note (Addendum)
Neurology Consultation  Reason for Consult: Right side twitching/jerks Referring Physician: Dr. Maren Beach  CC: AMS  History is obtained from:medical record   HPI: [name] is a 69 y.o. female with past medical history of HTN, ETOH abuse, smoker, alcoholic cirrhosis with ascites, lung Ca comes to hospital form SNF for increased confusion, increased jaundice, edema and refusing medications.  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  She was found to have UTI and started on ceftriaxone.  Neurology was called today to evaluate patient for right arm twitching/jerking noted.      ROS: A 14 point ROS was performed and is negative except as noted in the HPI. Unable to obtain due to altered mental status.   Past Medical History:  Diagnosis Date   Anemia    Cancer ([place]) 2007   lung cancer, right, chemo done   Complication of anesthesia 2007   slow to awaken   History of blood transfusion 2010   Hypertension    Occasional tremors      Family History  Problem Relation Age of Onset   Cancer Mother        bladder   Heart disease Father    Diabetes Father     Social History:   reports that she has been smoking cigarettes. She has a 75.00 pack-year smoking history. She has never used smokeless tobacco. She reports current alcohol use of about 7.0 standard drinks of alcohol per week. She reports that she does not use drugs.  Medications  Current Facility-Administered Medications:    0.9 %  sodium chloride infusion, , Intravenous, Continuous, Kc, Ramesh, MD, Last Rate: 75 mL/hr at 01/27/20 0813, New Bag at 01/27/20 0813   albuterol (PROVENTIL) (2.5 MG/3ML) 0.083% nebulizer solution 2.5 mg, 2.5 mg, Inhalation, Q4H PRN, Donne Hazel, MD   cefTRIAXone (ROCEPHIN) 2 g in sodium chloride 0.9 % 100 mL IVPB, 2 g, Intravenous, Q24H, Donne Hazel, MD, Last Rate: 200 mL/hr at 01/27/20 0924,  2 g at 01/27/20 0924   lactulose (CHRONULAC) 10 GM/15ML solution 30 g, 30 g, Oral, TID, Buccini, Robert, MD   lactulose [facility]) enema 200 gm, 300 mL, Rectal, BID PRN, Buccini, Robert, MD   levETIRAcetam (KEPPRA) IVPB 1500 mg/ 100 mL premix, 1,500 mg, Intravenous, STAT, Kc, Maren Beach, MD   [START ON 01/28/2020] levETIRAcetam (KEPPRA) IVPB 500 mg/100 mL premix, 500 mg, Intravenous, Q12H, Kc, Ramesh, MD   MEDLINE mouth rinse, 15 mL, Mouth Rinse, BID, Kc, Ramesh, MD, 15 mL at 01/27/20 0820   nicotine (NICODERM CQ - dosed in mg/24 hours) patch 14 mg, 14 mg, Transdermal, Daily, Donne Hazel, MD, 14 mg at 01/27/20 1039   ondansetron (ZOFRAN) injection 4 mg, 4 mg, Intravenous, Q6H PRN, Donne Hazel, MD   rifaximin Doreene Nest) tablet 550 mg, 550 mg, Oral, BID, Buccini, Robert, MD, 550 mg at 01/27/20 1028   senna-docusate (Senokot-S) tablet 1 tablet, 1 tablet, Oral, QHS PRN, Donne Hazel, MD   thiamine 500mg  in normal saline (19ml) IVPB, 500 mg, Intravenous, TID, Kc, Ramesh, MD   Exam: Current vital signs: BP 118/69 (BP Location: Right Arm)    Pulse (!) 107    Temp 98 F (36.7 C) (Oral)    Resp 18    Ht 5\' 4"  (1.626 m)    Wt 52 kg    SpO2 94%    BMI 19.68 kg/m  Vital signs in last 24 hours: Temp:  [  97.6 F (36.4 C)-98.2 F (36.8 C)] 98 F (36.7 C) (07/18 0529) Pulse Rate:  [106-115] 107 (07/18 0529) Resp:  [16-19] 18 (07/18 0529) BP: (103-122)/(69-104) 118/69 (07/18 0529) SpO2:  [94 %-96 %] 94 % (07/18 0529)  GENERAL: critically ill, frail elderly woman  HEENT: - Normocephalic and atraumatic, dry mm, no Thyromegally LUNGS - Clear to auscultation bilaterally with no wheezes CV - S1S2 RRR, no m/r/g, equal pulses bilaterally. ABDOMEN - Soft, nontender, distended with normoactive BS Ext: warm, well perfused, intact peripheral pulses, left foot +1 edema  NEURO:  Mental Status: responds verbally to painful stimuli, does not open eyes,  does not follow commands, does not answer  questions, keeps yelling "no" or "ohh" Language: unable to assess.  Cranial Nerves: PERRL 1mm/sluggish. EOMI, visual fields full, no facial asymmetry,  facial sensation intact, hearing intact, tongue/uvula/soft palate midline, normal, sternocleidomastoid and trapezius muscle strength. No evidence of tongue atrophy or fibrillations Motor: moves all extremities spontaneously, does have rhythmic twitching of right leg   Tone: is normal and bulk is normal Sensation- Intact to light touch bilaterally Coordination: unable to test Gait- deferred     Labs I have reviewed labs in epic and the results pertinent to this consultation are:  CBC    Component Value Date/Time   WBC 12.5 (H) 01/26/2020 0621   RBC 3.16 (L) 01/26/2020 0621   HGB 10.8 (L) 01/26/2020 0621   HGB 13.0 01/14/2011 0914   HGB 11.0 (L) 02/14/2008 0805   HCT 32.1 (L) 01/26/2020 0621   HCT 37.7 01/14/2011 0914   HCT 34.0 (L) 02/14/2008 0805   PLT 100 (L) 01/26/2020 0621   PLT 250 01/14/2011 0914   PLT 334 02/14/2008 0805   MCV 101.6 (H) 01/26/2020 0621   MCV 92 01/14/2011 0914   MCV 77.9 (L) 02/14/2008 0805   MCH 34.2 (H) 01/26/2020 0621   MCHC 33.6 01/26/2020 0621   RDW 12.9 01/26/2020 0621   RDW 13.3 01/14/2011 0914   RDW 16.1 (H) 02/14/2008 0805   LYMPHSABS 1.2 01/25/2020 1108   LYMPHSABS 1.2 01/14/2011 0914   LYMPHSABS 1.0 02/14/2008 0805   MONOABS 1.0 01/25/2020 1108   MONOABS 0.4 02/14/2008 0805   EOSABS 0.0 01/25/2020 1108   EOSABS 0.1 01/14/2011 0914   BASOSABS 0.0 01/25/2020 1108   BASOSABS 0.1 01/14/2011 0914   BASOSABS 0.1 02/14/2008 0805    CMP     Component Value Date/Time   NA 140 01/27/2020 0700   NA 144 03/26/2009 0824   K 3.5 01/27/2020 0700   K 4.7 03/26/2009 0824   CL 99 01/27/2020 0700   CL 106 03/26/2009 0824   CO2 26 01/27/2020 0700   CO2 31 03/26/2009 0824   GLUCOSE 99 01/27/2020 0700   GLUCOSE 96 03/26/2009 0824   BUN 26 (H) 01/27/2020 0700   BUN 9 03/26/2009 0824    CREATININE 1.65 (H) 01/27/2020 0700   CREATININE 0.5 (L) 03/26/2009 0824   CALCIUM 7.9 (L) 01/27/2020 0700   CALCIUM 9.4 03/26/2009 0824   PROT 5.5 (L) 01/27/2020 0700   PROT 7.5 03/26/2009 0824   ALBUMIN 3.2 (L) 01/27/2020 0700   ALBUMIN 3.6 03/26/2009 0824   AST 51 (H) 01/27/2020 0700   AST 36 03/26/2009 0824   ALT 30 01/27/2020 0700   ALT 18 03/26/2009 0824   ALKPHOS 107 01/27/2020 0700   ALKPHOS 64 03/26/2009 0824   BILITOT 4.7 (H) 01/27/2020 0700   BILITOT 0.30 03/26/2009 0824   GFRNONAA 31 (L) 01/27/2020  0700   GFRAA 36 (L) 01/27/2020 0700    Lipid Panel  No results found for: CHOL, TRIG, HDL, CHOLHDL, VLDL, LDLCALC, LDLDIRECT   Imaging I have reviewed the images obtained:  CT-scan of the brain 7/17 no acute abnormality, mild chronic ischemic white matter disease  MRI examination of the brain 7/18 abnormal signal in left parietal & occipital lobes, waiting on official read   Assessment:  [name] is a 69 y.o. female with past medical history of HTN, ETOH abuse, smoker, alcoholic cirrhosis with ascites, lung Ca comes to hospital form SNF for increased confusion, increased jaundice, edema and refusing medications. She was found to have UTI and started on ceftriaxone.  Neurology was called today to evaluate patient for right arm twitching/jerking noted.  She is getting Keppra load of 1500mg  IV x 1 now then 500mg  BID   Recommendations: [facility] [place] to [facility] ASAP - EEG r/o seizure -continue Thiamine 500mg  daily x 3 days, then 100mg  daily  -folate 1mg  daily  -check ammonia in am  -continue lactulose enemas -Check TSH, vit B12  -continue to treat infections -continue with Keppra  -Neurology will follow   Beulah Gandy, NP   Attending addendum Patient seen and examined at the request of Dr. Lupita Leash at [facility]  CC: Seizures HPI: She is a 69 year old woman with past medical history of extensive alcohol abuse, tobacco abuse, alcoholic cirrhosis with  ascites, lung cancer, hypertension who came in from a skilled nursing facility with increasing confusion, increasing jaundice, edema and refusing medications along with altered mental status. Found to have a large UTI and started on ceftriaxone. Neurology was consulted when the primary team started noticing that she has some a whole body twitching but more so right leg twitching and right neck twitching. This was concerning for seizures. She has not had seizures ever before. She has been a lifelong alcohol abuser according to family. I spoke with the niece and her stepdaughter who are both very involved in her care and were at bedside at [facility]. I was unable to obtain any review of systems due to her mentation Patient was unable to provide any reliable history  On exam, an elderly cachectic woman, obviously jaundiced morning uncomfortably in bed. She moves all her extremities spontaneously but does not follow commands She attempts to close her eyes tight when I examining her pupils. Does not follow commands Pupils are equal round reactive to light, cannot assess extraocular movements, face appears symmetric and mouth has poor hydration and dentition. Winces to noxious stim in all fours She is having continuous right leg twitching which is very rhythmic.  Her labs revealed extensive abnormalities that include an ammonia level of 142, creatinine of 1.65 with a BUN of 26 which is worse than the prior day, ammonia is also much elevated from prior, deranged kidney and renal function tests, leukocytosis, severe anemia with a hemoglobin of 7.7, and abnormal PT/INR.  Her MRI of the brain -reviewed by me personally , was extremity motion riddled but it shows evidence of diffusion abnormality in the left parietal and occipital lobes-not in any specific vascular territory concerning for seizure edema. Infection less likely but cannot be ruled out.  Assessment, and plan: 69 year old woman with  extensive past medical history of alcohol abuse, alcoholic cirrhosis with ascites, lung cancer, hypertension, coming in with progressive worsening mentation, refusal to take medication, on examination appears to be having right-sided focal seizures along with hepatic encephalopathy, with labs revealing ammonia in  the 142 level range, worsening renal function and MRI that is suggestive of seizure edema on the left hemisphere with clinical correlate with right-sided twitching.  She has already been loaded with Keppra.  Received 1 dose of benzo with no relief in her right leg twitching.  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  I had an extremely detailed conversation with her stepdaughter and niece regarding further management of her mentation and seizures. I discussed with them in detail that to be able to treat her seizures further we will have to add more than 1 antiepileptic and probably strong sedation which would mean intubating her.  They are very clear that she is a DNR and does not want to be intubated at any cost. I discussed the other option of trying adding Dilantin and seeing how she does. They are very realistic and understand that her overall health picture is not at all good and now with multiple organs showing signs of failure, her quality of life even if she survives this hospitalization is going to be poor.  Palliative discussions have already been taking place and they are on board with the hospice route if this acute hospitalization does not lead to her being better and cannot provide her better quality of life.  With me, they asked me questions about her neurological condition and the treatment options and I answered them to the best of my ability.  They finally decided along with all the information provided to them that we should  not transfer her to [facility] for continuous EEG but try to give her another antiepileptic to see if that helps.  If that does not help, and a spot EEG that we will order for the morning at [facility], does not show any improvement and shows continuous seizures, they will then decide to go the palliative route and would not want any more further escalation of care and would want hospice with comfort measures only.  Plan: -Fosphenytoin load now.  Check level in 2 hours.  Will request pharmacy help with dosing. -Continue Keppra -Check EEG in the morning, if still abnormal and seizures present, would not change them any further and would let the family go the hospice route with comfort measures. -Continue lactulose per GI and primary service.  May be the treatment of hyperammonemia might help her mentation and also the seizures. -We did discuss the option of transferring her to [facility] where long-term EEG might be available and we can assess her better but they decided that with her clear instructions of DO NOT RESUSCITATE and DO NOT INTUBATE, they do not want any thing done to her which would bring her to a point where intubation or resuscitation might be needed.  Given these limitations, I will add the Dilantin and assess response by EEG in the morning. And as above, if she continues to not do well clinically, the family has decided then to take the hospice route.  Spoke with Dr. Lupita Leash over the phone and relay my plan.  -- Amie Portland, MD [facility] Pager: [phone] If 7pm to 7am, please call on call as listed on AMION.

## 2020-01-27 NOTE — Progress Notes (Signed)
Patient's status is essentially unchanged from yesterday.  Palliative care note reviewed.  Patient's stepdaughter would like to continue supportive care for the time being to see how the patient's status evolves.  Today, she was at least able to tell me her first name.  She continues to have myoclonic jerking in the bed.  She is still saying "help me" as she did yesterday.  On exam, the abdomen is mildly protuberant but softer, I think, compared to yesterday and without palpable hepatosplenomegaly.  No tenderness.  No jaundice.  Blood work today shows progressive improvement in bilirubin level and liver enzymes.  CT shows shrunken liver suggestive of cirrhosis, as well as moderate ascites, but does not show varices or splenomegaly.  A fecal impaction was noted and has been addressed by manual disimpaction by the nurse, as well as administration of a Dulcolax suppository.  Impression:  1.  Altered mental status, perhaps with a component of hepatic encephalopathy, so far not responsive to lactulose (although not having bowel movements)  2.  Alcoholic hepatitis, improving biochemically.  Possible cirrhosis based on small liver on CT scan.  3.  Ascites, status post paracentesis approximately 3 weeks ago  Recommendation:  1.  Increase lactulose (ordered) since patient is not yet having bowel movements  2.  Follow ammonia, recognizing the lack of clear correlation between the ammonia level and the patient's mental status.  If the patient continues to have impaired mental functioning, I would consider neurologic consultation for alternative causes of patient's impaired mental status.  Cleotis Nipper, M.D. Pager [phone] If no answer or after 5 PM call [phone]

## 2020-01-27 NOTE — Significant Event (Signed)
Patient's 2 step daughters at bedside Lajoy Vanamburg and her sister). Spoke in details about her current situation. She has poor health and has multiple comorbid conditions including hepatic cirrhosis with coagulopathy/ascites, heavy chronic etoh use, AKI, moderate protein calorie malnutrition. She is DNR. Palliative care has discussed with them before too. 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 Extensive discussion with daughters-they would like to discuss with neurology before deciding on keeping her comfortable/palliative vs moving with plan for Transfer to [facility] and further care. I have notified Dr Rory Percy who was on his way to discuss with the family.  Dr Rory Percy discussed and at this time family elected not to transfer to [facility], trying dilantin, eeg in am. See neuro note.

## 2020-01-27 NOTE — Evaluation (Addendum)
Clinical/Bedside Swallow Evaluation Patient Details  Name: [name] MRN: [id number] Date of Birth: 1951/05/27  Today's Date: 01/27/2020 Time: SLP Start Time (ACUTE ONLY): 67 SLP Stop Time (ACUTE ONLY): 1415 SLP Time Calculation (min) (ACUTE ONLY): 46 min  Past Medical History:  Past Medical History:  Diagnosis Date  . Anemia   . Cancer [facility]) 2007   lung cancer, right, chemo done  . Complication of anesthesia 2007   slow to awaken  . History of blood transfusion 2010  . Hypertension   . Occasional tremors    Past Surgical History:  Past Surgical History:  Procedure Laterality Date  . COLONOSCOPY WITH PROPOFOL N/A 11/28/2012   Procedure: COLONOSCOPY WITH PROPOFOL;  Surgeon: Garlan Fair, MD;  Location: [facility];  Service: Endoscopy;  Laterality: N/A;  . REVERSE SHOULDER ARTHROPLASTY Left 08/14/2018   Procedure: REVERSE SHOULDER ARTHROPLASTY;  Surgeon: Netta Cedars, MD;  Location: [place];  Service: Orthopedics;  Laterality: Left;  . right lung lobectomy  2007   HPI:  69 yo female adm to [facility] with fatigue, declining to consume po or medications and AMS.  Pt diagnosed with toxic encephalopathy- found to have UTI  and increased bilirubin.   She has h/o alcoholic hepatitis with moderate ascities during recent admit and dc'd to [facility].   PMH also + for COPD, left lateral tongue cancer and s/p lung surgery.  Swallow eval ordered.  Pt at SNF was regular.   Assessment / Plan / Recommendation Clinical Impression  Limited assessment due to pt's AMS, leaning to the right side and experiencing myoclonic movement of right side (which was not seen on evaluation a few weeks ago).  SLP notified MD of right arm weakness and right sided jerking during session via secure chat and he advised he would order an MRI and obtain neuro eval.    Pt did not follow directions for oral motor exam but she could seal her lips to avoid oral care.   Lingual protrusion noted which was midline.  She  frequently stated "Oh God" ? stereotypical utterance during session but would not answer questions in a meaning manner.  Pt did nod her head yes to pain question but did not expand on information.  She did not follow directions Given pt's mentation - SlP only provided pt with tsps clears.  She inconsistently accepted this and swallowed with inconsistent delay and no indications of aspiration.  SLP did not offer more intake due to pt's mentation.   Recommend clear liquids via tsp when fully alert and accepting and medications with puree only.     SLP phoned [facility] to obtain premorbid function, an employee Felicia advised this SLP that pt didn't participate with care since admitted there a few weeks ago - needed total assist. She states pt did not moan however.  Pt reportedly required total assist and Felicia did not recall pt using either arm.    Will follow up for po tolerance, indication for instrumental eval and care plan.   RN informed of recommendations, concerns.  SLP Visit Diagnosis: Dysphagia, oral phase (R13.11)    Aspiration Risk  Severe aspiration risk    Diet Recommendation Thin liquid (clears via tsp)   Medication Administration:  (with puree - ? crush if large or suspension) Supervision: Full supervision/cueing for compensatory strategies Compensations: Slow rate;Small sips/bites (assure pt swallows before giving more) Postural Changes: Seated upright at 90 degrees;Remain upright for at least 30 minutes after po intake    Other  Recommendations Oral Care Recommendations: Oral care BID   Follow up Recommendations        Frequency and Duration min 2x/week  1 week       Prognosis Prognosis for Safe Diet Advancement: Guarded Barriers to Reach Goals: Cognitive deficits;Behavior;Motivation      Swallow Study   General HPI: 69 yo female adm to [facility] with fatigue, declining to consume po or medications and AMS.  Pt diagnosed with toxic encephalopathy- found to have UTI  and  increased bilirubin.   She has h/o alcoholic hepatitis with moderate ascities during recent admit and dc'd to [facility].   PMH also + for COPD, left lateral tongue cancer and s/p lung surgery.  Swallow eval ordered.  Pt at SNF was regular. Type of Study: Bedside Swallow Evaluation Previous Swallow Assessment: during prior admit Diet Prior to this Study: Regular;Thin liquids Temperature Spikes Noted: No Respiratory Status: Nasal cannula History of Recent Intubation: No Behavior/Cognition: Doesn't follow directions;Distractible;Lethargic/Drowsy;Confused Oral Cavity Assessment: Dried secretions (dried secretions on front dentition, SLP attempted to remove - pt resistant to oral care) Oral Cavity - Dentition: Poor condition Vision:  (total assist today, right side grossly weak - falls to bed with lifting, left side with some resistance when dropped to bed) Patient Positioning: Upright in bed Baseline Vocal Quality: Low vocal intensity Volitional Cough: Cognitively unable to elicit Volitional Swallow: Unable to elicit    Oral/Motor/Sensory Function Overall Oral Motor/Sensory Function: Generalized oral weakness (difficult to assess as pt does not follow directions, able to seal lips to prevent oral care) Lingual ROM:  (moved tongue anterior due to dried secretion on tip- no deviation but generally weak)   Ice Chips Ice chips: Not tested   Thin Liquid Thin Liquid: Impaired Presentation: Spoon Oral Phase Impairments: Reduced labial seal;Reduced lingual movement/coordination Oral Phase Functional Implications: Oral holding Pharyngeal  Phase Impairments: Suspected delayed Swallow    Nectar Thick Nectar Thick Liquid: Not tested   Honey Thick Honey Thick Liquid: Not tested   Puree Puree: Not tested   Solid     Solid: Not tested      Macario Golds 01/27/2020,3:33 PM  Kathleen Lime, MS [place] Office [phone]

## 2020-01-28 ENCOUNTER — Inpatient Hospital Stay (HOSPITAL_COMMUNITY)
Admit: 2020-01-28 | Discharge: 2020-01-28 | Disposition: A | Discharge status: Home / Self Care | Payer: Medicare Other | Attending: Neurology | Admitting: Neurology

## 2020-01-28 ENCOUNTER — Inpatient Hospital Stay (HOSPITAL_COMMUNITY): Payer: Medicare Other

## 2020-01-28 DIAGNOSIS — R569 Unspecified convulsions: Secondary | ICD-10-CM

## 2020-01-28 DIAGNOSIS — N179 Acute kidney failure, unspecified: Secondary | ICD-10-CM

## 2020-01-28 DIAGNOSIS — R4182 Altered mental status, unspecified: Secondary | ICD-10-CM

## 2020-01-28 LAB — COMPREHENSIVE METABOLIC PANEL
ALT: 27 U/L (ref 0–44)
AST: 48 U/L — ABNORMAL HIGH (ref 15–41)
Albumin: 3 g/dL — ABNORMAL LOW (ref 3.5–5.0)
Alkaline Phosphatase: 94 U/L (ref 38–126)
Anion gap: 14 (ref 5–15)
BUN: 27 mg/dL — ABNORMAL HIGH (ref 8–23)
CO2: 25 mmol/L (ref 22–32)
Calcium: 7.6 mg/dL — ABNORMAL LOW (ref 8.9–10.3)
Chloride: 104 mmol/L (ref 98–111)
Creatinine, Ser: 1.67 mg/dL — ABNORMAL HIGH (ref 0.44–1.00)
GFR calc Af Amer: 36 mL/min — ABNORMAL LOW (ref >60)
GFR calc non Af Amer: 31 mL/min — ABNORMAL LOW (ref >60)
Glucose, Bld: 124 mg/dL — ABNORMAL HIGH (ref 70–99)
Potassium: 3.6 mmol/L (ref 3.5–5.1)
Sodium: 143 mmol/L (ref 135–145)
Total Bilirubin: 3.9 mg/dL — ABNORMAL HIGH (ref 0.3–1.2)
Total Protein: 5 g/dL — ABNORMAL LOW (ref 6.5–8.1)

## 2020-01-28 LAB — CBC
HCT: 24.7 % — ABNORMAL LOW (ref 36.0–46.0)
Hemoglobin: 7.8 g/dL — ABNORMAL LOW (ref 12.0–15.0)
MCH: 34.2 pg — ABNORMAL HIGH (ref 26.0–34.0)
MCHC: 31.6 g/dL (ref 30.0–36.0)
MCV: 108.3 fL — ABNORMAL HIGH (ref 80.0–100.0)
Platelets: 134 10*3/uL — ABNORMAL LOW (ref 150–400)
RBC: 2.28 MIL/uL — ABNORMAL LOW (ref 3.87–5.11)
RDW: 13.8 % (ref 11.5–15.5)
WBC: 10.7 10*3/uL — ABNORMAL HIGH (ref 4.0–10.5)
nRBC: 0 % (ref 0.0–0.2)

## 2020-01-28 LAB — PROTIME-INR
INR: 1.8 — ABNORMAL HIGH (ref 0.8–1.2)
Prothrombin Time: 20.3 seconds — ABNORMAL HIGH (ref 11.4–15.2)

## 2020-01-28 LAB — AMMONIA: Ammonia: 16 umol/L (ref 9–35)

## 2020-01-28 LAB — MAGNESIUM: Magnesium: 2.7 mg/dL — ABNORMAL HIGH (ref 1.7–2.4)

## 2020-01-28 LAB — VITAMIN B12: Vitamin B-12: 1107 pg/mL — ABNORMAL HIGH (ref 180–914)

## 2020-01-28 LAB — TSH: TSH: 0.686 u[IU]/mL (ref 0.350–4.500)

## 2020-01-28 MED ORDER — LORAZEPAM 2 MG/ML IJ SOLN
1.0000 mg | Freq: Two times a day (BID) | INTRAMUSCULAR | Status: DC
  Administered 2020-01-29 – 2020-02-02 (×4): 1 mg via INTRAVENOUS
  Filled 2020-01-28 (×4): qty 1

## 2020-01-28 MED ORDER — SODIUM CHLORIDE 0.9 % IV SOLN
100.0000 mg | Freq: Two times a day (BID) | INTRAVENOUS | Status: DC
  Administered 2020-01-29 – 2020-01-30 (×3): 100 mg via INTRAVENOUS
  Filled 2020-01-28 (×4): qty 10

## 2020-01-28 MED ORDER — CLOBAZAM 10 MG PO TABS: 5.0000 mg | ORAL_TABLET | Freq: Every day | ORAL | Status: DC

## 2020-01-28 MED ORDER — LIP MEDEX EX OINT
TOPICAL_OINTMENT | CUTANEOUS | Status: DC | PRN
  Filled 2020-01-28 (×2): qty 7

## 2020-01-28 MED ORDER — SODIUM CHLORIDE 0.9 % IV SOLN
200.0000 mg | Freq: Once | INTRAVENOUS | Status: AC
  Administered 2020-01-28: 200 mg via INTRAVENOUS
  Filled 2020-01-28: qty 20

## 2020-01-28 MED ORDER — ALBUMIN HUMAN 5 % IV SOLN
12.5000 g | Freq: Once | INTRAVENOUS | Status: AC
  Administered 2020-01-28: 12.5 g via INTRAVENOUS
  Filled 2020-01-28: qty 250

## 2020-01-28 MED ORDER — LACTULOSE ENEMA
300.0000 mL | Freq: Two times a day (BID) | ORAL | Status: DC
  Administered 2020-01-28 – 2020-01-29 (×2): 300 mL via RECTAL
  Filled 2020-01-28 (×3): qty 300

## 2020-01-28 MED ORDER — ALBUMIN HUMAN 25 % IV SOLN
12.5000 g | Freq: Four times a day (QID) | INTRAVENOUS | Status: DC
  Administered 2020-01-28 – 2020-01-29 (×3): 12.5 g via INTRAVENOUS
  Filled 2020-01-28 (×4): qty 50

## 2020-01-28 MED ORDER — SODIUM CHLORIDE 0.9 % IV BOLUS
500.0000 mL | Freq: Once | INTRAVENOUS | Status: AC
  Administered 2020-01-28: 500 mL via INTRAVENOUS

## 2020-01-28 MED ORDER — SODIUM CHLORIDE 0.9 % IV SOLN: 100.0000 mg | Freq: Two times a day (BID) | INTRAVENOUS | Status: DC

## 2020-01-28 MED ORDER — SODIUM CHLORIDE 0.9 % IV SOLN
750.0000 mg | Freq: Two times a day (BID) | INTRAVENOUS | Status: DC
  Administered 2020-01-28 – 2020-02-01 (×8): 750 mg via INTRAVENOUS
  Filled 2020-01-28 (×8): qty 7.5
  Filled 2020-01-28: qty 5

## 2020-01-28 NOTE — Progress Notes (Signed)
SLP Cancellation Note  Patient Details Name: [name] MRN: [id number] DOB: Jan 28, 1951   Cancelled treatment:       Reason Eval/Treat Not Completed: Fatigue/lethargy limiting ability to participate. Consulted with RN - pt currently inappropriate for po trials or po medications due to mentation. RN reports pt having EEG this afternoon. Will recheck next date. Please call if needs arise in the interim.  [name], [facility], [place] Speech Language Pathologist Office: [phone] Pager: [phone]  [name] 01/28/2020, 10:26 AM

## 2020-01-28 NOTE — Progress Notes (Signed)
PT Cancellation Note  Patient Details Name: [name] MRN: [id number] DOB: 22-Feb-1951   Cancelled Treatment:    Reason Eval/Treat Not Completed: PT screened, no needs identified, will sign off.  Pt with recent admission and seen 01/07/20 with SNF recommended.  Pt familiar to this PT from previous admission.  Pt not very agreeable to mobility last admission.  Currently with medical issues and not following commands.  Pt admitted from [facility] per notes.   Uncertain of current d/c plan however pt will likely need 24/7 assist for safety upon d/c.  PT to sign off today due to multiple cancels, so please reorder if pt is willing to participate and plans to return to SNF for rehab.   Shamila Lerch,KATHrine E 01/28/2020, 9:24 AM Arlyce Dice, DPT Acute Rehabilitation Services Pager: [phone] Office: [phone]

## 2020-01-28 NOTE — Care Management Important Message (Signed)
Important Message  Patient Details IM Letter given to Marney Doctor RN Case Manager to present to the Patient Name: [name] MRN: [id number] Date of Birth: 03-07-1951   Medicare Important Message Given:  Yes     Kerin Salen 01/28/2020, 10:29 AM

## 2020-01-28 NOTE — Procedures (Signed)
Patient Name: [name]  MRN: [id number]  Epilepsy Attending: Lora Havens  Referring Physician/Provider: Dr Amie [place] Date: 01/28/2020 Duration: 24.14 mins  Patient history: 69 year old woman with extensive past medical history of alcohol abuse, alcoholic cirrhosis with ascites, lung cancer, hypertension, coming in with progressive worsening mentation, refusal to take medication, on examination appears to be having right-sided focal seizures along with hepatic encephalopathy, with labs revealing ammonia in the 142 level range, worsening renal function and MRI that is suggestive of seizure edema on the left hemisphere with clinical correlate with right-sided twitching. EEG to evaluate for seizure  Level of alertness:  lethargic  AEDs during EEG study: PHT, LEV  Technical aspects: This EEG study was done with scalp electrodes positioned according to the 10-20 International system of electrode placement. Electrical activity was acquired at a sampling rate of 500Hz  and reviewed with a high frequency filter of 70Hz  and a low frequency filter of 1Hz . EEG data were recorded continuously and digitally stored.   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 There is also continuous generalized 3 to 6 Hz theta-delta slowing.  Hyperventilation and photic stimulation were not performed.     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 The periodic epileptiform discharges discharges have overlying fast activity and at times are rhythmic which is on the ictal-interictal continuum with high potential for seizures.  Additionally, there is evidence of severe diffuse encephalopathy, non specific to etiology. NO definite seizures were seen.   Dr Aroor was immediately notified.   [name]

## 2020-01-28 NOTE — Progress Notes (Signed)
   01/28/20 0556  Vitals  Temp 98.1 F (36.7 C)  BP (!) 93/51  MAP (mmHg) 65  BP Location Right Arm  BP Method Automatic  Patient Position (if appropriate) Lying  Pulse Rate 67  Resp 19  Oxygen Therapy  SpO2 94 %  O2 Device Room Air  MEWS Score  MEWS Temp 0  MEWS Systolic 1  MEWS Pulse 0  MEWS RR 0  MEWS LOC 1  MEWS Score 2  MEWS Score Color Yellow  Patient treatment plan align with patient  current situation. Orientation improved. Will recheck Vitals and notified attending if needed.

## 2020-01-28 NOTE — Progress Notes (Signed)
PROGRESS NOTE  [name]:[id number] DOB: 03/30/1951 DOA: 01/25/2020 PCP: Wenda Low, MD   Chief Complaint  Patient presents with  . Altered Mental Status    Brief Narrative: 69 y.o. female with medical history significant of alcoholic cirrhosis with ascites, hx of etoh abuse, tobacco abuse, hx lung cancer, tongue tumor, HTN presents to ED from SNF for increased confusion, refusing meds and increased jaundice, edema. She was discharged recently on 7/5, admitted 01/04/20:following admission for treatment of ETOH hepatitis with ascites, encephalopathy/?wernicke's- felt unsafe for home due to confusion and sent to SNF. prior to last admit heavy etoh use 2 bottles wine/daily, living by herself. Since discharge to SNF,pt has been refusing meds and food per daughter and noted to be more edematous after not taking meds. ED Course:In the ED,pt found to have K of 2.5, Cr of 1.34, ammonia of 52. Total bilirubin was elevated at 6.8 (from 2.9 at discharge). UA was notable for 6-10 WBC's with pos nitrites. Cultures were obtained and pt started on empiric rocephin.Hospitalist consulted for consideration for admission. Patient was admitted seen by gastroenterologist underwent ultrasound and CT abdomen that showed evidence of cirrhosis, stable 12 mm nodule low-grade bronchogenic carcinoma, stool impaction. Patient was being managed with lactulose, please call disimpaction, supportive measures. She was noticed to have increasing twitching on the right side, MRI brain was done 7/18 limited by motion but did show evidence of a stable seizure edema on the left hemisphere with clinical correlate with right-sided twitching. Neurology urgently evaluated the patient and discussed with the daughter and the niece-option was given regarding transfer to [facility] and monitoring on continuous EEG but given that patient is DO NOT RESUSCITATE DO NOT INTUBATE as per her wishes family declined and wanted to see how she does  and patient was given Keppra, Dilantin and EEG was ordered for the morning.family wanted to pursue palliative route if patient does not improve much.  Subjective:  This morning still some twitching on the right leg, but seem less and appears more alert, says- " don't do that" "yeah" "oh", withdraws to pain more on the left arm and left leg able to feel pain on the right leg and rt arm is moving more today. Ammonia has normalized. Creatinine at 1.67, LFTs total bili 3.9, AST 48, ALT 27, anemia with hemoglobin 10.8 g, WBC 10.7, platelet counts are 134.  TSH 0.6, Dilantin level 15.8  Assessment & Plan:  Acute encephalopathy/suspected Hepatic encephalopathy in the setting of cirrhosis/hepatitis and UTI, refusal to take medicine including lactulose/rifaximin at the facility:On last d/c 01/14/20-alert to person and place only. There was concern for wernicke's encephalopathy-felt unsafe for home due to confusion and sent to SNF.Prior to last admit heavy etoh use 2 bottles wine/daily, living by herself. MRI brain 7/18 limited by motion but did show evidence of seizure edema on the left hemisphere with clinical correlate with right-sided twitching. Neurology urgently evaluated the patient 7/18- and discussed with the daughter and the niece-option was given regarding transfer to [facility] and monitoring on continuous EEG but given that patient is DO NOT RESUSCITATE DO NOT INTUBATE as per her wishes, family declined transfer and wanted to see how she does and patient was given Keppra, Dilantin and EEG was ordered for the morning-EEG showed evidence of epileptogenic city in left hemisphere, maximal left posterior quadrant, periodic epileptiform discharges have overlying fast activity at times are rhythmic which is on the ictal-interictal continuum with a high potential for seizures, additionally with evidence  of severe diffuse encephalopathy nonspecific etiology.  No definite seizures were seen.  Patient overall remains  the same or slightly better today. Family meeting planned today, on lactulose enema, supportive measures and NPO. 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  Seen by neurology, on Keppra and Dilantin EEG with left-sided epileptiform discharges, severe encephalopathy. Will d/w neurology. On dilantin and keppra iv. Spoke with Dr Lorraine Lax and pt's daughter in video conference and plan is to continue treat seizures for another 2 days and see if it improves, there is chance that seizure meds will make her lose airway but no intubation and if that happens than hospice will be initiated and if if pt does not improved after uptitrating the AEDs then family interested in palliative care/comfort. We will move patient to SDU.  GYJ:EHUDJSHFWY admission 1.30, creatinine remains up we will cont  remains on IV fluid hydration gently, Foley catheter in place.  . Recent Labs  Lab 01/25/20 1108 01/26/20 0621 01/27/20 0700 01/28/20 0625  BUN 25* 23 26* 27*  CREATININE 1.34* 1.20* 1.65* 1.67*   Hypomagnesemia 1.4, aggressively treated improved at 2.7  Hypokalemia resolved.  Acute lower UTI POA: s/p 4 doses of ceftriaxone. culture negative so far in blood and urine. Discussed with pharmacy and will stop abx given risk of lowering seizure thresholds.    Alcoholic liver disease with Hypoalbuminemia,coagulopathic with INR worsening 1.4> 1.8>1.8,ascites, moderately elevated ammonia 52 -> 142> 1, on lactulose enema. CT abdomen shows hepatic cirrhosis, moderate ascites, stool impaction- was disimpacted. Diuretics-lasix/aldactone is on hold due to AKI.On Last admission she had ultrasound-guided paracentesis yielding 2.4 L fluid.  Total bili remains up 3.9. Recent Labs  Lab 01/25/20 1108 01/26/20 0621 01/27/20 0700 01/28/20 0625  AST 75* 72* 51* 48*  ALT 45* 38 30 27  ALKPHOS 142* 124 107 94  BILITOT 6.8* 5.7* 4.7* 3.9*   PROT 6.0* 5.3* 5.5* 5.0*  ALBUMIN 2.8* 2.3* 3.2* 3.0*  INR 1.4*  --  1.8* 1.8*   Mild lactic acidosis has resolved.   Noncompliant with meds/refusing meds at the SNF.  COPD pfts pending/ active smoker: Chest x-ray unremarkable, on RA. As per med rec- was discharged on steroid taper-was supposed to start prednisone 10 mg from 3 more days then stop-NPO now- cont iv steroid for 2 more days  Thrombocytopenia platelet at 135k, from liver cirrhosis.  Monitor.   Recent Labs  Lab 01/25/20 1108 01/26/20 0621 01/27/20 1615 01/28/20 0625  PLT 146* 100* 135* 134*   Fecal impaction/constipation was manually disimpacted and also received Dulcolax suppository 7/17.  Also on lactulose enema  Low-grade bronchogenic carcinoma noted in the CT scan and also history of mouth tumor patient stated reports patient did not want to pursue further management of those carcinoma/tumor when they were found few yrs ago.  Macrocytic anemia suspect from alcohol abuse.  Essential Tremors hx-as per daughter she is on pimodine for that. she has tremors on both of her hand and at times voice gets shaky from her tremors as per daughter.  Moderate protein calorie malnutrition remains n.p.o. for now.  GOC:DNR.  Full prognosis remains poor.    Prognosis appears guarded. palliative care following along.  Daughter understands overall condition. Ongoing discussion and meeting with palliative care today.  I had explained and discussed with her  Before 7/17 and 7/18.  DVT prophylaxis: Place and maintain sequential compression device Start: 01/26/20 1051 lovenox held  due to dropping platelet count and with INR being up Code Status:   Code Status: DNR  Family Communication: Had extensive discussion with patient's daughter and niece yesterday.  Family meeting with palliative care planned today.  I spoke with patient's daughter over the phone and neurologist.  Status is: Inpatient  Remains inpatient appropriate  because:Altered mental status and Inpatient level of care appropriate due to severity of illness patient remains confused/encephalopathic and in partial status epilepticus.  Dispo: The patient is from: SNF              Anticipated d/c is to: SNF/TBD              Anticipated d/c date is: >3 days              Patient currently is not medically stable to d/c.  Diet Order            Diet NPO time specified Except for: Other (See Comments)  Diet effective now                 Body mass index is 19.68 kg/m. Pressure Ulcer: skin team in rt arm.  Consultants:see note  Procedures:  CT ABDOMEND reviewed as below: "1. Findings consistent with hepatic cirrhosis.Moderate ascites isnoted. 2. Bilateral nonobstructive nephrolithiasis. No hydronephrosis or renal obstruction is noted. 3. Large amount of stool is noted in the rectum consistent with impaction. 4. Stable 12 mm nodule is noted in left lower lobe laterally. This may represent low-grade bronchogenic carcinoma as described on the prior PET scan of June 21, 2018"  Microbiology:see note Blood Culture    Component Value Date/Time   SDES  01/25/2020 1108    BLOOD LEFT ANTECUBITAL Performed at [facility], [place] [address], [place], [place] [address]    SDES  01/25/2020 1108    URINE, CLEAN CATCH Performed at [facility], [place] [address] [place], [place] [address]    SPECREQUEST  01/25/2020 1108    BOTTLES DRAWN AEROBIC AND ANAEROBIC Blood Culture adequate volume Performed at [place] [address] [place], [place] [address]    SPECREQUEST  01/25/2020 1108    NONE Performed at [facility], [place] [address], [place], [place] [address]    CULT  01/25/2020 1108    NO GROWTH 3 DAYS Performed at [facility], [place] [address] [place], [place] [address]    CULT  01/25/2020 1108    NO GROWTH Performed at [facility], [place] [address] [place], [place] [address]    REPTSTATUS PENDING 01/25/2020 1108   REPTSTATUS 01/26/2020 FINAL 01/25/2020 1108    Other culture-see note  Medications: Scheduled Meds: . Chlorhexidine Gluconate Cloth  6 each Topical Daily  . cloBAZam  5 mg Oral Daily  . dexamethasone (DECADRON) injection  2 mg Intravenous Q24H  . folic acid  1 mg Intravenous Daily  . lactulose  30 g Oral TID  . mouth rinse  15 mL Mouth Rinse BID  . nicotine  14 mg Transdermal Daily  . phenytoin (DILANTIN) IV  100 mg Intravenous Q8H  . rifaximin  550 mg Oral BID   Continuous Infusions: . sodium chloride 75 mL/hr at 01/27/20 0813  . cefTRIAXone (ROCEPHIN)  IV 2 g (01/28/20 0930)  . levETIRAcetam 500 mg (01/28/20 1439)  . thiamine injection 500 mg (01/28/20 0930)    Antimicrobials: Anti-infectives (From admission, onward)   Start     Dose/Rate  Route Frequency Ordered Stop   01/26/20 1230  rifaximin (XIFAXAN) tablet 550 mg     Discontinue     550 mg Oral 2 times daily 01/26/20 1143     01/26/20 1000  cefTRIAXone (ROCEPHIN) 2 g in sodium chloride 0.9 % 100 mL IVPB     Discontinue     2 g 200 mL/hr over 30 Minutes Intravenous Every 24 hours 01/25/20 2333     01/25/20 1200  cefTRIAXone (ROCEPHIN) 1 g in sodium chloride 0.9 % 100 mL IVPB        1 g 200 mL/hr over 30 Minutes Intravenous  Once 01/25/20 1151 01/25/20 1234       Objective: Vitals: Today's Vitals   01/27/20 2218 01/28/20 0556 01/28/20 0613 01/28/20 1259  BP: 102/68 (!) 93/51  (!) 95/53  Pulse: 72 67  71  Resp: 20 19  18   Temp: (!) 97.4 F (36.3 C) 98.1 F (36.7 C)  (!) 97.4 F (36.3 C)  TempSrc: Oral   Axillary  SpO2: 95% 94%  94%  Weight:      Height:      PainSc:   0-No pain     Intake/Output Summary (Last 24 hours) at 01/28/2020 1520 Last data filed at 01/28/2020 1442 Gross per 24 hour  Intake 0 ml  Output 215 ml  Net -215 ml   Filed Weights   01/25/20 1059  Weight: 52 kg   Weight change:    Intake/Output from previous  day: No intake/output data recorded. Intake/Output this shift: Total I/O In: 0  Out: 215 [Urine:215]  Examination:  General exam: Drowsy, briefly wakes up, and was unable to answer questions, mumbling words HEENT:Oral mucosa moist, Ear/Nose WNL grossly, dentition normal. Respiratory system: bilaterally clear,no wheezing or crackles,no use of accessory muscle Cardiovascular system: S1 & S2 +, No JVD,. Gastrointestinal system: Abdomen soft, mildly distended, BS+ Nervous System: lethargic,moving extremities only on left side Extremities: No edema, distal peripheral pulses palpable.  Skin: No rashes,no icterus. MSK: thin muscle bulk,tone, power  Data Reviewed: I have personally reviewed following labs and imaging studies CBC: Recent Labs  Lab 01/25/20 1108 01/26/20 0621 01/27/20 1615 01/28/20 0625  WBC 12.1* 12.5* 11.1* 10.7*  NEUTROABS 9.8*  --   --   --   HGB 11.5* 10.8* 7.7* 7.8*  HCT 34.8* 32.1* 23.0* 24.7*  MCV 105.5* 101.6* 104.1* 108.3*  PLT 146* 100* 135* 341*   Basic Metabolic Panel: Recent Labs  Lab 01/25/20 1108 01/26/20 0621 01/27/20 0700 01/28/20 0625  NA 137 136 140 143  K 2.5* 3.2* 3.5 3.6  CL 92* 94* 99 104  CO2 31 29 26 25   GLUCOSE 124* 81 99 124*  BUN 25* 23 26* 27*  CREATININE 1.34* 1.20* 1.65* 1.67*  CALCIUM 7.6* 7.3* 7.9* 7.6*  MG  --   --  1.4* 2.7*  PHOS  --   --  2.7  --    GFR: Estimated Creatinine Clearance: 25.7 mL/min (A) (by C-G formula based on SCr of 1.67 mg/dL (H)). Liver Function Tests: Recent Labs  Lab 01/25/20 1108 01/26/20 0621 01/27/20 0700 01/28/20 0625  AST 75* 72* 51* 48*  ALT 45* 38 30 27  ALKPHOS 142* 124 107 94  BILITOT 6.8* 5.7* 4.7* 3.9*  PROT 6.0* 5.3* 5.5* 5.0*  ALBUMIN 2.8* 2.3* 3.2* 3.0*   Recent Labs  Lab 01/25/20 1108  LIPASE 46   Recent Labs  Lab 01/25/20 1052 01/27/20 1615 01/28/20 0625  AMMONIA 52* 142* 16  Coagulation Profile: Recent Labs  Lab 01/25/20 1108 01/27/20 0700  01/28/20 0625  INR 1.4* 1.8* 1.8*   Cardiac Enzymes: No results for input(s): CKTOTAL, CKMB, CKMBINDEX, TROPONINI in the last 168 hours. BNP (last 3 results) No results for input(s): PROBNP in the last 8760 hours. HbA1C: No results for input(s): HGBA1C in the last 72 hours. CBG: No results for input(s): GLUCAP in the last 168 hours. Lipid Profile: No results for input(s): CHOL, HDL, LDLCALC, TRIG, CHOLHDL, LDLDIRECT in the last 72 hours. Thyroid Function Tests: Recent Labs    01/28/20 0625  TSH 0.686   Anemia Panel: Recent Labs    01/28/20 0625  VITAMINB12 1,107*   Sepsis Labs: Recent Labs  Lab 01/25/20 1052 01/25/20 1520  LATICACIDVEN 2.1* 1.9    Recent Results (from the past 240 hour(s))  Culture, blood (routine x 2)     Status: None (Preliminary result)   Collection Time: 01/25/20 11:08 AM   Specimen: BLOOD  Result Value Ref Range Status   Specimen Description   Final    BLOOD LEFT ANTECUBITAL Performed at [facility], [place] [address], [place], [place] [address]    Special Requests   Final    BOTTLES DRAWN AEROBIC AND ANAEROBIC Blood Culture adequate volume Performed at [place] [address], [place], [place] [address]    Culture   Final    NO GROWTH 3 DAYS Performed at [facility], [place] [address] [place], [place] [address]    Report Status PENDING  Incomplete  Urine culture     Status: None   Collection Time: 01/25/20 11:08 AM   Specimen: Urine, Clean Catch  Result Value Ref Range Status   Specimen Description   Final    URINE, CLEAN CATCH Performed at [facility], [place] [address] [place], [place] [address]    Special Requests   Final    NONE Performed at [facility], [place] [address], [place], [place] [address]    Culture   Final    NO GROWTH Performed at [facility], [place] [address], [place], [place] [address]    Report Status 01/26/2020 FINAL   Final  SARS Coronavirus 2 by RT PCR (hospital order, performed in [facility] hospital lab) Nasopharyngeal Nasopharyngeal Swab     Status: None   Collection Time: 01/25/20 12:05 PM   Specimen: Nasopharyngeal Swab  Result Value Ref Range Status   SARS Coronavirus 2 NEGATIVE NEGATIVE Final    Comment: (NOTE) SARS-CoV-2 target nucleic acids are NOT DETECTED.  The SARS-CoV-2 RNA is generally detectable in upper and lower respiratory specimens during the acute phase of infection. The lowest concentration of SARS-CoV-2 viral copies this assay can detect is 250 copies / mL. A negative result does not preclude SARS-CoV-2 infection and should not be used as the sole basis for treatment or other patient management decisions.  A negative result may occur with improper specimen collection / handling, submission of specimen other than nasopharyngeal swab, presence of viral mutation(s) within the areas targeted by this assay, and inadequate number of viral copies (<250 copies / mL). A negative result must be combined with clinical observations, patient history, and epidemiological information.  Fact Sheet for Patients:   StrictlyIdeas.no  Fact Sheet for Healthcare Providers: BankingDealers.co.za  This test is not yet approved or  cleared by the [place] FDA and has been authorized for detection and/or diagnosis of SARS-CoV-2 by FDA under an Emergency  Use Authorization (EUA).  This EUA will remain in effect (meaning this test can be used) for the duration of the COVID-19 declaration under Section 564(b)(1) of the Act, 21 U.S.C. section 360bbb-3(b)(1), unless the authorization is terminated or revoked sooner.  Performed at [facility], [place] [address] [place] [address] [place], [place] [address]       Radiology Studies: CT PELVIS WO CONTRAST  Result Date: 01/27/2020 CLINICAL DATA:  Abdomen and pelvic distention with a clinical concern  for ascites or bladder distention. EXAM: CT PELVIS WITHOUT CONTRAST TECHNIQUE: Multidetector CT imaging of the pelvis was performed following the standard protocol without intravenous contrast. COMPARISON:  01/26/2020 FINDINGS: Urinary Tract: Normal appearing urinary bladder with no bladder dilatation. Bowel:  Prominent stool in the rectum.  No dilated bowel loops seen. Vascular/Lymphatic: Atheromatous calcifications. No enlarged lymph nodes. Reproductive:  No mass or other significant abnormality Other:  Moderate amount of ascites without significant change. Musculoskeletal: Lower lumbar spine degenerative changes. IMPRESSION: 1. No acute abnormality. 2. Moderate amount of ascites without significant change. 3. Prominent stool in the rectum. 4. Normal urinary bladder. Electronically Signed   By: Claudie Revering M.D.   On: 01/27/2020 17:02   MR BRAIN WO CONTRAST  Result Date: 01/27/2020 CLINICAL DATA:  Encephalopathy EXAM: MRI HEAD WITHOUT CONTRAST TECHNIQUE: Multiplanar, multiecho pulse sequences of the brain and surrounding structures were obtained without intravenous contrast. COMPARISON:  None. FINDINGS: Significant motion artifact is present. Findings below are within this significant limitation. Brain: There is apparent reduced diffusion in the left parietal and occipital lobes without definite corresponding abnormality on T2 images. 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 Chronic infarct of the left occipitotemporal lobes with ex vacuo dilatation of the adjacent lateral ventricle. Patchy foci of T2 hyperintensity in the supratentorial white matter are nonspecific but may reflect mild chronic microvascular ischemic changes. Prominence of the ventricles and sulci reflects generalized parenchymal volume loss. No significant mass effect or extra-axial fluid collection. Vascular: Major vessel flow voids at the skull  base are preserved. Skull and upper cervical spine: Marrow signal grossly within normal limits. Sinuses/Orbits: Paranasal sinuses are aerated. Orbits are unremarkable. Other: Mastoid air cells are clear. IMPRESSION: Significantly motion degraded study. Apparent abnormal diffusion signal in the left parietal and occipital lobes, with common differential considerations including seizure effect and acute infarction (less likely given multiple vascular territories and no abnormality on other sequences). 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 Mild chronic microvascular ischemic changes. These results were called by telephone at the time of interpretation on 01/27/2020 at 4:29 pm to provider [facility] , who verbally acknowledged these results. Electronically Signed   By: Macy Mis M.D.   On: 01/27/2020 16:43   US RENAL  Result Date: 01/27/2020 CLINICAL DATA:  Acute kidney injury. EXAM: RENAL / URINARY TRACT ULTRASOUND COMPLETE COMPARISON:  CT abdomen and pelvis 01/26/2020. FINDINGS: Right Kidney: Renal measurements: 10.6 x 4.3 x 5.7 cm = volume: 138.1 mL . Echogenicity within normal limits. Small stone seen on CT are not visible on this exam. No mass or hydronephrosis visualized. Left Kidney: Renal measurements: 10.8 x 5.3 x 4.8 cm = volume: 141.5 mL. Echogenicity within normal limits. Small stones seen on CT are not visible on this exam. No mass or hydronephrosis visualized. Bladder: Appears normal for degree of bladder distention. Other: Cirrhotic liver and ascites are noted as seen on prior CT.  IMPRESSION: Negative for hydronephrosis or acute abnormality. Cirrhotic liver and ascites is seen on CT. Electronically Signed   By: Inge Rise M.D.   On: 01/27/2020 12:31   EEG adult  Result Date: 01/28/2020 Lora Havens, MD     01/28/2020  2:40 PM Patient Name: [name] MRN: [id number]  Epilepsy Attending: Lora Havens Referring Physician/Provider: Dr Amie [place] Date: 01/28/2020 Duration: 24.14 mins Patient history: 69 year old woman with extensive past medical history of alcohol abuse, alcoholic cirrhosis with ascites, lung cancer, hypertension, coming in with progressive worsening mentation, refusal to take medication, on examination appears to be having right-sided focal seizures along with hepatic encephalopathy, with labs revealing ammonia in the 142 level range, worsening renal function and MRI that is suggestive of seizure edema on the left hemisphere with clinical correlate with right-sided twitching. EEG to evaluate for seizure Level of alertness:  lethargic AEDs during EEG study: PHT, LEV Technical aspects: This EEG study was done with scalp electrodes positioned according to the 10-20 International system of electrode placement. Electrical activity was acquired at a sampling rate of 500Hz  and reviewed with a high frequency filter of 70Hz  and a low frequency filter of 1Hz . EEG data were recorded continuously and digitally stored. 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 There is also continuous generalized 3 to 6 Hz theta-delta slowing.  Hyperventilation and photic stimulation were not performed.   ABNORMALITY -Periodic epileptiform discharges, left hemisphere, maximal left posterior quadranr -Continuous slow, generalized IMPRESSION: This study showed evidence of epileptogenicity in left hemisphere, maximal left posterior quadrant likely secondary to underlying cortical  abnormality. The periodic epileptiform discharges discharges have overlying fast activity and at times are rhythmic which is on the ictal-interictal continuum with high potential for seizures. Additionally, there is evidence of severe diffuse encephalopathy, non specific to  etiology. NO definite seizures were seen. Dr Aroor was immediately notified. Lora Havens     LOS: 3 days   Antonieta Pert, MD [facility]  01/28/2020, 3:20 PM

## 2020-01-28 NOTE — Progress Notes (Signed)
EEG completed, results pending. 

## 2020-01-28 NOTE — Progress Notes (Signed)
OT Cancellation Note  Patient Details Name: [name] MRN: [id number] DOB: 1950/12/25   Cancelled Treatment:    Reason Eval/Treat Not Completed: Medical issues which prohibited therapy. Patient with ongoing lethargy and poor mentation limiting ability to participate in therapy. Per chart review family is considering hospice pending EEG results. Please re-consult OT if patient able and willing to participate in therapy and if D/C plan is for SNF.   Delbert Phenix OT Pager: [place] 01/28/2020, 12:06 PM

## 2020-01-28 NOTE — Progress Notes (Signed)
[facility] Gastroenterology Progress Note  [name] 69 y.o. [birth year]  CC:  Worsening liver function, encephalopathy  Subjective: Patient is confused and unable to provide any subjective data.  Per flow sheet, patient had 3 bowel movements yesterday and has had one thus far today.  ROS : Limited by patient's confusion.   Objective: Vital signs in last 24 hours: Vitals:   01/27/20 2218 01/28/20 0556  BP: 102/68 (!) 93/51  Pulse: 72 67  Resp: 20 19  Temp: (!) 97.4 F (36.3 C) 98.1 F (36.7 C)  SpO2: 95% 94%    Physical Exam:  General:  Lethargic, confused, unable to follow commands, unable to provide name and does not provide verbal responses to any questions  Head:  Normocephalic, without obvious abnormality, atraumatic  Eyes:  Scleral icterus, EOM's intact  Lungs:   Clear to auscultation bilaterally, respirations unlabored  Heart:  Regular rate and rhythm, S1, S2 normal  Abdomen:   Soft, distended, not overtly tender though some voluntary guarding, normoactive bowel sounds   Extremities: Extremities normal, atraumatic, no  edema  Pulses: 2+ and symmetric    Lab Results: Recent Labs    01/27/20 0700 01/28/20 0625  NA 140 143  K 3.5 3.6  CL 99 104  CO2 26 25  GLUCOSE 99 124*  BUN 26* 27*  CREATININE 1.65* 1.67*  CALCIUM 7.9* 7.6*  MG 1.4* 2.7*  PHOS 2.7  --    Recent Labs    01/27/20 0700 01/28/20 0625  AST 51* 48*  ALT 30 27  ALKPHOS 107 94  BILITOT 4.7* 3.9*  PROT 5.5* 5.0*  ALBUMIN 3.2* 3.0*   Recent Labs    01/25/20 1108 01/26/20 0621 01/27/20 1615 01/28/20 0625  WBC 12.1*   < > 11.1* 10.7*  NEUTROABS 9.8*  --   --   --   HGB 11.5*   < > 7.7* 7.8*  HCT 34.8*   < > 23.0* 24.7*  MCV 105.5*   < > 104.1* 108.3*  PLT 146*   < > 135* 134*   < > = values in this interval not displayed.   Recent Labs    01/27/20 0700 01/28/20 0625  LABPROT 20.1* 20.3*  INR 1.8* 1.8*    Assessment: Encephalopathy: does not appear to be improved despite  having multiple bowel movements on lactulose and decrease in ammonia.   -Ammonia 16 today as compared to 142 yesterday -MRI yesterday showed concern for seizure effect.  Acute alcoholic hepatitis: LFTs continue to improve.  Today, T. Bili 3.9/AST 48/ ALT 27/ ALP 94 as compared to yesterday T. Bili 4.7/ AST 51/ ALT 30/ ALP 107  Cirrhosis: Meld score of 23 as of 01/28/20. -CT 7/17: The liver is small most consistent with hepatic cirrhosis. Moderate ascites is noted. -thrombocytopenia (134 K/uL).   -INR elevated to 1.8  Macrocytic anemia: Hgb 7.8 today. No signs of rectal bleeding or melena.  Fecal impaction, resolved  Plan: Continue lactulose at present doses (30 g TID PO or 300 mL enema if unable to take PO).  Goal of 2-3 soft BMs per day.  Continue Rifaximin 550 mg BID.  Continue supportive care.  [facility] will follow.  Salley Slaughter PA-C 01/28/2020, 9:46 AM  Contact #  [phone]

## 2020-01-28 NOTE — Progress Notes (Signed)
Daily Progress Note   Patient Name: [name]       Date: 01/28/2020 DOB: [birth year]  Age: 69 y.o. MRN#: [id number] Attending Physician: Sydney Pert, MD Primary Care Physician: Sydney Low, MD Admit Date: 01/25/2020  Reason for Consultation/Follow-up: Establishing goals of care  Subjective: Patient lying in bed in a somewhat fetal position. Somnolent however will open eyes and withdraw from pain. Continues to state "no or oh". Unable to answer questions appropriately.   Family [name], [place] and [place], niece) is at the bedside with [name] (service dog). Patient shows no response to family or dog which they states she loves. Support given. Discussed at length with family patient's overall poor prognosis. Updates provided. Family verbalized understanding. Family sharing patient's previous health state months ago with obvious declined over the past several weeks to months. We discussed the severity of patient's co-morbidities with understanding patient's health will continue to decline and she remains high risk for repetitive hospitalizations. [name] verbalized understanding expressing she would not want that for [name]. Family expressed patient would most likely want to be comfortable knowing that her quality of life has declined extensively. Therapeutic listening and support provided.   Patient underwent EEG testing today with pending results. Family reports they are waiting on results and to receive updates from medical providers prior to making any final decisions however, they are leaning towards more of a comfort level of care.   Family asked appropriate questions regarding hospice care. Detailed education provided on the Palliative versus Hospice and where care could take place. Family verbalized understanding and expressed they do know patient could not return home. They do not feel she would benefit from continued rehabilitation given her further health decline, however, if providers felt  there was a chance for improvement they would allow her this opportunity. Support given. Family reports they are leaning towards allowing patient to be kept comfortable during what time she has left however, again noting they are not prepared to make final decisions until they have spoken with the attending and Neurologist regarding EEG and expectations. Support provided.   Family confirms DNR/DNI, no artificial feedings/PEG or aggressive interventions at this time. They would like to continue to treat the treatable while hospitalized until final decisions have been made regarding care.   All questions answered. Family aware I will follow up with them tomorrow as requested for ongoing discussions and support.   Length of Stay: 3 days  Vital Signs: BP (!) 93/51 (BP Location: Right Arm)    Pulse 67    Temp 98.1 F (36.7 C)    Resp 19    Ht 5\' 4"  (1.626 m)    Wt 52 kg    SpO2 94%    BMI 19.68 kg/m  SpO2: SpO2: 94 % O2 Device: O2 Device: Room Air O2 Flow Rate:    Physical Exam: -somnolent, frail, chronically-ill appearing -hypotensive -normal breathing pattern -withdraws from pain, no appropriate verbal response, does not follow commands            Palliative Care Assessment & Plan   HPI: Palliative Care consult requested for goals of care discussion in this 69 y.o. female with a past medical history of hypertension, right lung cancer status post chemotherapy in 2007, and ETOH abuse admitted on 01/25/2020 with acute alcoholic hepatitis, ascities, toxic metabolic encephalopathy, UTI, moderate protein calorie malnutrition, elevated LTF's, and ARF.   Code Status:  DNR  Goals of Care/Recommendations:  Continue to treat the treatable   Family  leaning towards hospice/comfort care however would like to receive further updates from attending/Neurology regarding EEG results. If patient had a chance of some recovery they would want to allow her this opportunity, with understanding of overall  prognosis and co-morbidities.   No aggressive interventions/artificial feedings.   PMT will continue to support and follow for ongoing discussions   Prognosis: Guarded-Poor   Discharge Planning: To Be Determined  Thank you for allowing the Palliative Medicine Team to assist in the care of this patient.  Time Total: 55 min.   Visit consisted of counseling and education dealing with the complex and emotionally intense issues of symptom management and palliative care in the setting of serious and potentially life-threatening illness.Greater than 50%  of this time was spent counseling and coordinating care related to the above assessment and plan.  Alda Lea, AGPCNP-BC  Palliative Medicine Team [phone]

## 2020-01-28 NOTE — Progress Notes (Signed)
PALLIATIVE NOTE:  CHART REVIEWED. Patient assessed at the bedside. No family present. I was able to call and speak to [name] [facility]) to provide support and encourage ongoing [place] discussions. [name] verbalized understanding and appreciation.   She is coming to the hospital soon and requesting to meet at the bedside for [place] discussions around 1330. Confirmed request. Mrs. Matas is aware I will plan to meet her as requested.   Alda Lea, AGPCNP-BC Palliative Medicine Team  Phone: [phone]  NO CHARGE

## 2020-01-28 NOTE — Progress Notes (Addendum)
Reason for consult: Focal status epilepticus  Subjective: Patient slightly more alert compared to yesterday.  Continues to have twitching of the right lower extremity.   ROS: negative except above   Examination  Vital signs in last 24 hours: Temp:  [97.4 F (36.3 C)-98.1 F (36.7 C)] 97.4 F (36.3 C) (07/19 1259) Pulse Rate:  [67-72] 71 (07/19 1259) Resp:  [18-20] 18 (07/19 1259) BP: (93-102)/(51-68) 95/53 (07/19 1259) SpO2:  [94 %-95 %] 94 % (07/19 1259)  General: lying in bed CVS: pulse-normal rate and rhythm RS: breathing comfortably Extremities: normal   Neuro: MS: Alert, verbalizes to leave her alone, does not follow commands or answer any questions CN: pupils equal and reactive,  EOMI, tracks across the room, face symmetric, Motor: 4/5 in both left upper and lower extremities, 3/5 in right upper extremity, 2/5 in right lower extremities Reflexes:plantars: flexor Coordination: normal Gait: not tested  Basic Metabolic Panel: Recent Labs  Lab 01/25/20 1108 01/25/20 1108 01/26/20 0621 01/27/20 0700 01/28/20 0625  NA 137  --  136 140 143  K 2.5*  --  3.2* 3.5 3.6  CL 92*  --  94* 99 104  CO2 31  --  29 26 25   GLUCOSE 124*  --  81 99 124*  BUN 25*  --  23 26* 27*  CREATININE 1.34*  --  1.20* 1.65* 1.67*  CALCIUM 7.6*   < > 7.3* 7.9* 7.6*  MG  --   --   --  1.4* 2.7*  PHOS  --   --   --  2.7  --    < > = values in this interval not displayed.    CBC: Recent Labs  Lab 01/25/20 1108 01/26/20 0621 01/27/20 1615 01/28/20 0625  WBC 12.1* 12.5* 11.1* 10.7*  NEUTROABS 9.8*  --   --   --   HGB 11.5* 10.8* 7.7* 7.8*  HCT 34.8* 32.1* 23.0* 24.7*  MCV 105.5* 101.6* 104.1* 108.3*  PLT 146* 100* 135* 134*     Coagulation Studies: Recent Labs    01/27/20 0700 01/28/20 0625  LABPROT 20.1* 20.3*  INR 1.8* 1.8*    Imaging Reviewed:     ASSESSMENT AND PLAN  69 year old woman with extensive past medical history of alcohol abuse, alcoholic cirrhosis  with ascites, lung cancer, hypertension, coming in with progressive worsening mentation and noted to have rhythmic twitching of the right leg.  Neurology consulted for possible seizure activity.  Evaluated by Dr. Wilford Corner concerning for partial status epilepticus.  Patient was loaded with Keppra also started on Dilantin.  Lactulose was started for hyperammonemia.  Goals of care was discussed with family given patient's advanced cirrhosis and cancer-patient was made DNR and DNI and it was decided against transfer to [facility] for continued seizure monitoring.  Today ammonia level is improved and normalized after lactulose has been initiated.  Patient is more alert but continues to have right leg twitching.  EEG showed frequent PLEDs in the left hemisphere concerning for ongoing partial status epilepticus.  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 after patient able to swallow -Continue Dilantin and Keppra  (renal dose)  Discussed again with family goals of care, family expressed again their wishes for her to remain DNR and DNI.  We agreed to try 2 more drugs (Vimpat and Ativan)  in addition to the 2 previous ones-if she continues to seize then will lean towards comfort care   CRITICAL CARE Performed by: Lanice Schwab Starlette Thurow   Total critical care time: 35 minutes  Critical care time was exclusive of separately billable procedures and treating other patients.  Critical care was necessary to treat or prevent imminent or life-threatening deterioration.  Critical care was time spent personally by me on the following activities: development of treatment plan with patient and/or surrogate as well as nursing, discussions with consultants, evaluation of patient's response to treatment,  examination of patient, obtaining history from patient or surrogate, ordering and performing treatments and interventions, ordering and review of laboratory studies, ordering and review of radiographic studies, pulse oximetry and re-evaluation of patient's condition.   Karena Addison Arthur Aydelotte [facility] Pager Number [phone] For questions after 7pm please refer to AMION to reach the Neurologist on call

## 2020-01-29 DIAGNOSIS — R0902 Hypoxemia: Secondary | ICD-10-CM

## 2020-01-29 DIAGNOSIS — R7989 Other specified abnormal findings of blood chemistry: Secondary | ICD-10-CM

## 2020-01-29 LAB — CBC
HCT: 22.3 % — ABNORMAL LOW (ref 36.0–46.0)
Hemoglobin: 7 g/dL — ABNORMAL LOW (ref 12.0–15.0)
MCH: 34.7 pg — ABNORMAL HIGH (ref 26.0–34.0)
MCHC: 31.4 g/dL (ref 30.0–36.0)
MCV: 110.4 fL — ABNORMAL HIGH (ref 80.0–100.0)
Platelets: 127 10*3/uL — ABNORMAL LOW (ref 150–400)
RBC: 2.02 MIL/uL — ABNORMAL LOW (ref 3.87–5.11)
RDW: 13.9 % (ref 11.5–15.5)
WBC: 10.2 10*3/uL (ref 4.0–10.5)
nRBC: 0 % (ref 0.0–0.2)

## 2020-01-29 LAB — COMPREHENSIVE METABOLIC PANEL
ALT: 25 U/L (ref 0–44)
AST: 42 U/L — ABNORMAL HIGH (ref 15–41)
Albumin: 3.3 g/dL — ABNORMAL LOW (ref 3.5–5.0)
Alkaline Phosphatase: 87 U/L (ref 38–126)
Anion gap: 14 (ref 5–15)
BUN: 28 mg/dL — ABNORMAL HIGH (ref 8–23)
CO2: 22 mmol/L (ref 22–32)
Calcium: 7.6 mg/dL — ABNORMAL LOW (ref 8.9–10.3)
Chloride: 107 mmol/L (ref 98–111)
Creatinine, Ser: 1.61 mg/dL — ABNORMAL HIGH (ref 0.44–1.00)
GFR calc Af Amer: 37 mL/min — ABNORMAL LOW (ref >60)
GFR calc non Af Amer: 32 mL/min — ABNORMAL LOW (ref >60)
Glucose, Bld: 111 mg/dL — ABNORMAL HIGH (ref 70–99)
Potassium: 3.3 mmol/L — ABNORMAL LOW (ref 3.5–5.1)
Sodium: 143 mmol/L (ref 135–145)
Total Bilirubin: 3.1 mg/dL — ABNORMAL HIGH (ref 0.3–1.2)
Total Protein: 5.2 g/dL — ABNORMAL LOW (ref 6.5–8.1)

## 2020-01-29 LAB — PROTIME-INR
INR: 2 — ABNORMAL HIGH (ref 0.8–1.2)
Prothrombin Time: 21.8 seconds — ABNORMAL HIGH (ref 11.4–15.2)

## 2020-01-29 LAB — AMMONIA: Ammonia: 62 umol/L — ABNORMAL HIGH (ref 9–35)

## 2020-01-29 MED ORDER — ORAL CARE MOUTH RINSE: 15.0000 mL | OROMUCOSAL | Status: DC | PRN

## 2020-01-29 MED ORDER — POLYVINYL ALCOHOL 1.4 % OP SOLN: 1.0000 [drp] | Freq: Four times a day (QID) | OPHTHALMIC | Status: DC | PRN

## 2020-01-29 MED ORDER — SODIUM CHLORIDE 0.9 % IV BOLUS
500.0000 mL | Freq: Once | INTRAVENOUS | Status: AC
  Administered 2020-01-29: 500 mL via INTRAVENOUS

## 2020-01-29 MED ORDER — ALBUMIN HUMAN 25 % IV SOLN
12.5000 g | Freq: Once | INTRAVENOUS | Status: AC
  Administered 2020-01-29: 12.5 g via INTRAVENOUS
  Filled 2020-01-29: qty 50

## 2020-01-29 MED ORDER — MORPHINE SULFATE (PF) 2 MG/ML IV SOLN
1.0000 mg | INTRAVENOUS | Status: DC | PRN
  Administered 2020-01-29: 1 mg via INTRAVENOUS
  Administered 2020-01-30 – 2020-02-01 (×3): 2 mg via INTRAVENOUS
  Filled 2020-01-29 (×5): qty 1

## 2020-01-29 MED ORDER — LORAZEPAM 2 MG/ML IJ SOLN: 1.0000 mg | INTRAMUSCULAR | Status: DC | PRN

## 2020-01-29 MED ORDER — POTASSIUM CHLORIDE 10 MEQ/100ML IV SOLN
10.0000 meq | INTRAVENOUS | Status: AC
  Administered 2020-01-29 (×3): 10 meq via INTRAVENOUS
  Filled 2020-01-29: qty 100

## 2020-01-29 MED ORDER — BIOTENE DRY MOUTH MT LIQD: 15.0000 mL | OROMUCOSAL | Status: DC | PRN

## 2020-01-29 MED ORDER — GLYCOPYRROLATE 0.2 MG/ML IJ SOLN: 0.3000 mg | INTRAMUSCULAR | Status: DC | PRN

## 2020-01-29 NOTE — TOC Initial Note (Signed)
Transition of Care [facility]) - Initial/Assessment Note    Patient Details  Name: [name] MRN: [id number] Date of Birth: 11/08/1950  Transition of Care [facility]) CM/SW Contact:    Leeroy Cha, RN Phone Number: 01/29/2020, 8:50 AM  Clinical Narrative:                 Pt has pcp and is coming from home where she has a pda.  Now lethargic and not following commands, family consulted by md -comfort if continues to decline. Plan follow for progression of symptoms.  Patient has long hx of etoh abuse and cirrhosis.  Expected Discharge Plan: Skilled Nursing Facility Barriers to Discharge: Continued Medical Work up   Patient Goals and CMS Choice Patient states their goals for this hospitalization and ongoing recovery are:: daughter-back to snf versus comfort care CMS Medicare.gov Compare Post Acute Care list provided to:: Patient Represenative (must comment) Choice offered to / list presented to : Adult Children  Expected Discharge Plan and Services Expected Discharge Plan: [place]   Discharge Planning Services: CM Consult Post Acute Care Choice: [place] Living arrangements for the past 2 months: [place] Expected Discharge Date:  (unknown)                                    Prior Living Arrangements/Services Living arrangements for the past 2 months: [place] Lives with:: Facility Resident                   Activities of Daily Living Home Assistive Devices/Equipment: Eyeglasses, Environmental consultant (specify type) ADL Screening (condition at time of admission) Patient's cognitive ability adequate to safely complete daily activities?: No (patient very drowsy) Is the patient deaf or have difficulty hearing?: Yes Does the patient have difficulty seeing, even when wearing glasses/contacts?: Yes Does the patient have difficulty concentrating, remembering, or making decisions?: Yes Patient able to express need for  assistance with ADLs?: Yes Does the patient have difficulty dressing or bathing?: Yes Independently performs ADLs?: No Communication: Independent Dressing (OT): Needs assistance Is this a change from baseline?: Change from baseline, expected to last >3 days Grooming: Needs assistance Is this a change from baseline?: Change from baseline, expected to last >3 days Feeding: Needs assistance Is this a change from baseline?: Change from baseline, expected to last >3 days Bathing: Needs assistance Is this a change from baseline?: Change from baseline, expected to last >3 days Toileting: Needs assistance Is this a change from baseline?: Pre-admission baseline In/Out Bed: Needs assistance Is this a change from baseline?: Change from baseline, expected to last >3 days Walks in Home: Needs assistance Is this a change from baseline?: Change from baseline, expected to last >3 days Does the patient have difficulty walking or climbing stairs?: Yes (secondary to weakness) Weakness of Legs: Both Weakness of Arms/Hands: Both  Permission Sought/Granted                  Emotional Assessment              Admission diagnosis:  Hypokalemia [E87.6] Hyperbilirubinemia [X32.3] Toxic metabolic encephalopathy [F57] Altered mental status, unspecified altered mental status type [D22.02] Alcoholic cirrhosis, unspecified whether ascites present ([place]) [K70.30] Patient Active Problem List   Diagnosis Date Noted  . Alcoholic cirrhosis ([place])   . Palliative care by specialist   . Goals of care, counseling/discussion   . Weakness   .  Toxic metabolic encephalopathy 49/20/1007  . Hyperbilirubinemia 01/25/2020  . ARF (acute renal failure) ([place]) 01/25/2020  . Acute lower UTI 01/25/2020  . DNR (do not resuscitate) 01/25/2020  . Acute metabolic encephalopathy 06/30/7587  . Alcoholism ([place]) 01/05/2020  . Tobacco use disorder 01/05/2020  . Pressure injury of skin 01/05/2020  . COPD exacerbation ([place])  01/04/2020  . Alcoholic hepatitis with ascites 01/04/2020  . Malnutrition of moderate degree 08/14/2018  . 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 Alcohol dependence with withdrawal ([place]) 08/13/2018  . Elevated LFTs 08/13/2018  . Hypokalemia 08/13/2018  . Macrocytic anemia 08/13/2018  . Hypotension 08/13/2018  . Closed fracture of left humerus 08/13/2018  . Tachycardia   . Closed left humeral fracture 08/12/2018  . Solitary pulmonary nodule on lung CT 06/24/2018  . COPD pfts pending/ active smoker 06/24/2018  . Cigarette smoker 06/24/2018  . Tongue ulceration 06/14/2018  . Personal history of colonic polyps 01/29/2013  . Internal prolapsed hemorrhoids 01/29/2013   PCP:  Wenda Low, MD Pharmacy:   [facility] [place], [place] - [address] AT [facility] [address] [place] [address] Phone: [phone] Fax: [phone]     Social Determinants of Health (SDOH) Interventions    Readmission Risk Interventions Readmission Risk Prevention Plan 01/08/2020  Transportation Screening Complete  HRI or Home Care Consult Complete  Social Work Consult for [place] Planning/Counseling Complete  Palliative Care Screening Not Applicable  Medication Review Press photographer) Complete  Some recent data might be hidden

## 2020-01-29 NOTE — Progress Notes (Signed)
Reason for consult: Focal status epilepticus  Subjective: Patient continues to be seizing with rhythmic right lower extremity twitching.  Moans to pain, does not follow any commands and nonverbal.  ROS:  Unable to obtain due to poor mental status  Examination  Vital signs in last 24 hours: Temp:  [96.8 F (36 C)-97.9 F (36.6 C)] 97.2 F (36.2 C) (07/20 1200) Pulse Rate:  [65-78] 66 (07/20 1600) Resp:  [12-27] 13 (07/20 1600) BP: (53-101)/(19-76) 96/35 (07/20 1600) SpO2:  [86 %-100 %] 97 % (07/20 1600) Weight:  [54.8 kg] 54.8 kg (07/19 1822)  General: lying in bed CVS: pulse-normal rate and rhythm RS: breathing comfortably Extremities: normal   Neuro: MS: Lethargic, not following commands nonverbal CN: pupils equal and reactive,   face symmetric, tongue midline, Motor: Withdraws briskly in left upper and lower extremity, withdraws in right upper extremity and right lower extremity has rhythmic twitching. Reflexes: 2+ bilaterally over patella, biceps, plantars: flexor Coordination: normal Gait: not tested  Basic Metabolic Panel: Recent Labs  Lab 01/25/20 1108 01/25/20 1108 01/26/20 0621 01/26/20 0621 01/27/20 0700 01/28/20 0625 01/29/20 0307  NA 137  --  136  --  140 143 143  K 2.5*  --  3.2*  --  3.5 3.6 3.3*  CL 92*  --  94*  --  99 104 107  CO2 31  --  29  --  26 25 22   GLUCOSE 124*  --  81  --  99 124* 111*  BUN 25*  --  23  --  26* 27* 28*  CREATININE 1.34*  --  1.20*  --  1.65* 1.67* 1.61*  CALCIUM 7.6*   < > 7.3*   < > 7.9* 7.6* 7.6*  MG  --   --   --   --  1.4* 2.7*  --   PHOS  --   --   --   --  2.7  --   --    < > = values in this interval not displayed.    CBC: Recent Labs  Lab 01/25/20 1108 01/26/20 0621 01/27/20 1615 01/28/20 0625 01/29/20 0307  WBC 12.1* 12.5* 11.1* 10.7* 10.2  NEUTROABS 9.8*  --   --   --   --   HGB 11.5* 10.8* 7.7* 7.8* 7.0*  HCT 34.8* 32.1* 23.0* 24.7* 22.3*  MCV 105.5* 101.6* 104.1* 108.3* 110.4*  PLT 146* 100*  135* 134* 127*     Coagulation Studies: Recent Labs    01/27/20 0700 01/28/20 0625 01/29/20 0307  LABPROT 20.1* 20.3* 21.8*  INR 1.8* 1.8* 2.0*    Imaging Reviewed:     ASSESSMENT AND PLAN  Refractory focal status epilepticus Advanced cirrhosis Bronchogenic carcinoma Alcohol abuse Hepatic encephalopathy Hyperammonemia Urinary tract infection  -No improvement despite 3 AEDs and scheduled Ativan -Had detailed discussion with family, offered to start patient on phenobarbital as well but family declined.  Patient has been suffering and family would not like to prolong this any further, requests transition to comfort care/hospice. -We will continue at least 2 antiepileptic agents as she is being transitioned to hospice as worsening seizures could be potentially be painful   Neurology will be available as needed  CRITICAL CARE Performed by: Lanice Schwab Shantrice Rodenberg   Total critical care time: 35 minutes  Critical care time was exclusive of separately billable procedures and treating other patients.  Critical care was necessary to treat or prevent imminent or life-threatening deterioration.  Critical care was time spent personally by  me on the following activities: development of treatment plan with patient and/or surrogate as well as nursing, discussions with consultants, evaluation of patient's response to treatment, examination of patient, obtaining history from patient or surrogate, ordering and performing treatments and interventions, ordering and review of laboratory studies, ordering and review of radiographic studies, pulse oximetry and re-evaluation of patient's condition.   Karena Addison Agustin Swatek [facility] Pager Number [phone] For questions after 7pm please refer to AMION to reach the Neurologist on call

## 2020-01-29 NOTE — Progress Notes (Signed)
Daily Progress Note   Patient Name: [name]       Date: 01/29/2020 DOB: 01/25/1951  Age: 69 y.o. MRN#: [id number] Attending Physician: Antonieta Pert, MD Primary Care Physician: Wenda Low, MD Admit Date: 01/25/2020  Reason for Consultation/Follow-up: Establishing goals of care  Subjective: Saw patient this morning. Receiving IV fluids, hypotensive, and more lethargic. Noticeable right sided twitching. Unable to follow commands. RN and NT at the bedside providing care. No family at the bedside.   Spoke with [name] [facility]) and provided updates. Discussed patient's continued decline despite medical interventions, including continued seizure like activity, now with hypotension, and lethargy. Daughter is tearful in discussion. We discussed what would be most important to patient at this time. Daughter questioning proceeding with aggressive measures including intubation, however I strongly encouraged against given patient's severe decline and co-morbidities. Discussed heroic and aggressive measures is only prolonging patient's life and will not resolve her illnesses or result in any meaningful recovery. She verbalized understanding. [name] confirms wishes for patient to remain a DNR/DNI. She is trying to find transportation as she is having care trouble this morning. Expressed my concerns that patient is at very high risk of further decompensation and sudden death. [name] verbalized understanding. She would like the medical team to continue current plan of care until she is able to arrive and make further decisions. [name] aware patient could pass away at anytime. She plans to call once she has arrived to the hospital.   1415: Stepdaughter, [name] now at the bedside with Eustaquio Maize (patient's niece). Provided further updates at the bedside. Patient remains hypotensive. Lethargic with no response. Will open right eye intermittently. No verbal response. [name] tearful expressing how ill patient appeared.  Emotional support provided.   We discussed recommendations for full comfort with anticipated hospital death. Family verbalized understanding and [name] expressed wishes to proceed with comfort. Education provided on what comfort care would look like for patient focusing on eliminating any suffer by managing symptoms. She verbalized understanding. Family is aware all medications and medical interventions not comfort focused would be discontinued. Education provided on visitor policy in the setting of comfort/end-of-life. Family verbalized understanding. [name] reports patient was a private person and did not want many people involved in her health concerns. She is however, calling some family members to allow them the opportunity to choose to visit patient or not. Support given.   Family spent some time sharing memories of patient over the years. Therapeutic listening provided.   Length of Stay: 4 days  Vital Signs: BP 101/76   Pulse 75   Temp (!) 97.4 F (36.3 C) (Axillary)   Resp 18   Ht 5\' 4"  (1.626 m)   Wt 54.8 kg   SpO2 95%   BMI 20.74 kg/m  SpO2: SpO2: 95 % O2 Device: O2 Device: Nasal Cannula O2 Flow Rate: O2 Flow Rate (L/min): 4 L/min  Physical Exam: -lethargic, now unresponsive, chronically ill appearing, frail, cachectic -irregular, hypotensive -mouth breathing, 4L/Floyd          -ecchymosis around neck area -unable to respond or follow commands, does withdraw some to pain. (on morning assessment right sided twitching observed)  Palliative Care Assessment & Plan    Code Status:  DNR  Goals of Care/Recommendations:  Extensive discussion with family (Angela-HCPOA and Beth-niece) regarding patient's decline and poor prognosis. Family has requested to transition care to full comfort for end-of-life. [name] verbalized wishes for patient to no longer suffer.   Discontinue all medications and interventions  not comfort focused.   Anticipated hospital death Morphine PRN for  pain/air hunger/comfort Robinul PRN for excessive secretions Ativan PRN for agitation/anxiety/Continue scheduled ativan for seizures as ordered Continue anti-epileptic measures for comfort/prevention of further seizure activity Zofran PRN for nausea Liquifilm tears PRN for dry eyes Unrestricted visitations in the setting of EOL (per policy) Oxygen PRN 2L or less for comfort. No escalation.   PMT will continue to support and follow as needed.   Prognosis: Hours - Days  Discharge Planning: Anticipated Hospital Death  Care plan was discussed with family, RN, and secured message Dr. Lupita Leash.  Thank you for allowing the Palliative Medicine Team to assist in the care of this patient.  Time Total: 70 min.   Visit consisted of counseling and education dealing with the complex and emotionally intense issues of symptom management and palliative care in the setting of serious and potentially life-threatening illness.Greater than 50%  of this time was spent counseling and coordinating care related to the above assessment and plan.  Alda Lea, AGPCNP-BC  Palliative Medicine Team [phone]

## 2020-01-29 NOTE — Progress Notes (Signed)
PROGRESS NOTE  LEEYAH HEATHER DHR:[id number] DOB: 10/21/1950 DOA: 01/25/2020 PCP: Wenda Low, MD   Chief Complaint  Patient presents with  . Altered Mental Status    Brief Narrative: 69 y.o. female with medical history significant of alcoholic cirrhosis with ascites, hx of etoh abuse, tobacco abuse, hx lung cancer, tongue tumor, HTN presents to ED from SNF for increased confusion, refusing meds and increased jaundice, edema. She was discharged recently on 7/5, admitted 01/04/20:following admission for treatment of ETOH hepatitis with ascites, encephalopathy/?wernicke's- felt unsafe for home due to confusion and sent to SNF. prior to last admit heavy etoh use 2 bottles wine/daily, living by herself. Since discharge to SNF,pt has been refusing meds and food per daughter and noted to be more edematous after not taking meds. ED Course:In the ED,pt found to have K of 2.5, Cr of 1.34, ammonia of 52. Total bilirubin was elevated at 6.8 (from 2.9 at discharge). UA was notable for 6-10 WBC's with pos nitrites. Cultures were obtained and pt started on empiric rocephin.Hospitalist consulted for consideration for admission. Patient was admitted seen by gastroenterologist underwent ultrasound and CT abdomen that showed evidence of cirrhosis, stable 12 mm nodule low-grade bronchogenic carcinoma, stool impaction. Patient was being managed with lactulose, please call disimpaction, supportive measures. She was noticed to have increasing twitching on the right side, MRI brain was done 7/18 limited by motion but did show evidence of a stable seizure edema on the left hemisphere with clinical correlate with right-sided twitching.Neurology urgently evaluated the patient and discussed with the daughter and the niece-option was given regarding transfer to [facility] and monitoring on continuous EEG but given that patient is DO NOT RESUSCITATE DO NOT INTUBATE as per her wishes family declined transfer wanted to see how she  does with meds, and patient was given Keppra, Dilantin and EEG was ordered.  EEG next morning showed evidence of epileptogenic discharges.-Neurology discussed with patient's daughter and after discussion patient was tried on additional AEDs to see if she improves, if she does not improve comfort measure was advised   Subjective: Seen this morning. Patient has been getting IV fluids as well as albumin overnight for hypotension. Patient continues to have twitching on the right side appears more lethargic today. Daughter was urgently contacted by palliative care-she was on her way and requested [place] to keep the current treatment going and do not escalate the care. Daughter arrived this afternoon and after discussion patient was transitioned to comfort measures with palliative care meeting.  Assessment & Plan:  Acute encephalopathy/suspected Hepatic encephalopathy in the setting of cirrhosis/Acute Alcoholic hepatitis and UTI, refusal to take medicine including lactulose/rifaximin at the facility:On last d/c 01/14/20-alert to person and place only. There was concern for wernicke's encephalopathy-felt unsafe for home due to confusion and sent to SNF.Prior to last admit heavy etoh use 2 bottles wine/daily, living by herself. MRI brain 7/18 limited by motion but did show evidence of seizure edema on the left hemisphere with clinical correlate with right-sided twitching. Neurology urgently evaluated the patient 7/18- and discussed with the daughter and the niece-option was given regarding transfer to [facility] and monitoring on continuous EEG but given that patient is DO NOT RESUSCITATE DO NOT INTUBATE as per her wishes, family declined transfer and wanted to see how she does and patient was given Keppra, Dilantin and EEG was ordered for the morning-EEG showed evidence of epileptogenic dsicharge in left hemisphere, maximal left posterior quadrant, periodic epileptiform discharges have overlying fast activity at times  are rhythmic which is on the ictal-interictal continuum with a high potential for seizures, additionally with evidence of severe diffuse encephalopathy nonspecific etiology.  Patient was placed on Vimpat, scheduled Ativan, transferred to stepdown unit to monitor on AEDs.  Did not improve despite additional AEDs-still having twiitching/Jerk on the right leg every seconds-I discussed with neurologist Dr. Lorraine Lax this morning and at this time recommendation was either to intubate and do burst suppression vs comfort measure.  Palliative care had a meeting with patient's daughter after she arrived and she elected to continue with palliative/comfort measures.  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  Please see the discussion above.  Did not improve despite multiple AEDs patient is more lethargic today.  Acute alcoholic hepatitis/alcoholic liver disease with Hypoalbuminemia,  liver failure, withcoagulopathy  worsening INR 1.4> 1.8>1.8>2, with ascites, moderately elevated ammonia. On lactulose enema. CT abdomen shows hepatic cirrhosis.Diuretics-lasix/aldactone is on hold due to AKI.On Last admission she had ultrasound-guided paracentesis yielding 2.4 L fluid.  MELD score is 23-corresponds to 19.6 3 months mortality.  Hypotension likely multifactorial from volume depletion, hypoalbuminemia, from hypoalbuminemia in the setting of liver cirrhosis, antiseizure medication.  Per daughter no escalation of care.  Blood pressure remained low despite IV fluid hydration, multiple dose of albumin.  Acute hypoxic respiratory failure: In the setting of encephalopathy, also layering pleural effusion atelectasis from associated hypoalbuminemia/third spacing.  Patient has been on 4 L nasal cannula in the stepdown unit. Remains on oxygen, unable to use Lasix due to hypotension/AKI.  ZSW:FUXNATFTDD admission 1.30, creatinine remains up at 1.6.on IV fluids/albumin. Foley catheter in place.  . Recent  Labs  Lab 01/25/20 1108 01/26/20 0621 01/27/20 0700 01/28/20 0625 01/29/20 0307  BUN 25* 23 26* 27* 28*  CREATININE 1.34* 1.20* 1.65* 1.67* 1.61*   Hypomagnesemia was repleted.  Hypokalemia repleted.  Acute lower UTI POA: s/p 4 doses of ceftriaxone. culture negative so far in blood and urine. Discussed with pharmacy and stopped antibiotics as there is concern for antibiotics lowering seizure threshold and she completed the course.   Mild lactic acidosis has resolved.   Noncompliant with meds/refusing meds at the SNF.  COPD pfts pending/ active smoker: Chest x-ray unremarkable, on RA.  Thrombocytopenia platelet at 127k. Recent Labs  Lab 01/25/20 1108 01/26/20 0621 01/27/20 1615 01/28/20 0625 01/29/20 0307  PLT 146* 100* 135* 134* 127*   Low-grade bronchogenic carcinoma noted in the CT scan and also history of mouth tumor patient stated reports patient did not want to pursue further management of those carcinoma/tumor when they were found few yrs ago.  Macrocytic anemia suspect from alcohol abuse/from chronic liver disease hemoglobin low at 7.0 gm downtrending..  Fecal impaction/constipation was manually disimpacted and also received Dulcolax suppository 7/17.  Also on lactulose enema  Essential Tremors hx-as per daughter she is on pimodine for that. she has tremors on both of her hand and at times voice gets shaky from her tremors as per daughter.  Moderate protein calorie malnutrition remains n.p.o. for now.  GOC:DNR.  After extensive palliative meeting today and based on patient's poor health status, and after discussion with the neurology and given ongoing seizure despite multiple MEDICATIONS, family elected to transition to comfort measures.  DVT prophylaxis:  lovenox held due to dropping platelet count and with INR being up Code Status:   Code Status: DNR  Family Communication: Had extensive discussion with patient's daughter and niece yesterday.  Family meeting with  palliative care planned today.  I spoke with patient's daughter  over the phone and neurologist.  Status is: Inpatient  Remains inpatient appropriate because ongoing encephalopathy, seizure  Dispo: The patient is from: SNF              Anticipated d/c is MP:NTIRWER              Anticipated d/c date is: 1-2 days              Patient currently is not medically stable to d/c.  Diet Order            Diet NPO time specified Except for: Other (See Comments)  Diet effective now                 Body mass index is 20.74 kg/m. Pressure Ulcer: skin team in rt arm.  Consultants:see note  Procedures:  CT ABDOMEND reviewed as below: "1. Findings consistent with hepatic cirrhosis.Moderate ascites isnoted. 2. Bilateral nonobstructive nephrolithiasis. No hydronephrosis or renal obstruction is noted. 3. Large amount of stool is noted in the rectum consistent with impaction. 4. Stable 12 mm nodule is noted in left lower lobe laterally. This may represent low-grade bronchogenic carcinoma as described on the prior PET scan of June 21, 2018"  Microbiology:see note Blood Culture    Component Value Date/Time   SDES  01/25/2020 1108    BLOOD LEFT ANTECUBITAL Performed at [facility], [place] [address], [place], [place] [address]    SDES  01/25/2020 1108    URINE, CLEAN CATCH Performed at [facility], [place] [address] [place], [place] [address]    SPECREQUEST  01/25/2020 1108    BOTTLES DRAWN AEROBIC AND ANAEROBIC Blood Culture adequate volume Performed at [place] [address] [place], [place] [address]    SPECREQUEST  01/25/2020 1108    NONE Performed at [facility], [place] [address], [place], [place] [address]    CULT  01/25/2020 1108    NO GROWTH 4 DAYS Performed at [facility], [place] [address], [place], [place] [address]    CULT  01/25/2020 1108    NO GROWTH Performed at [place], [place] [address] [place], [place] [address]    REPTSTATUS PENDING 01/25/2020 1108   REPTSTATUS 01/26/2020 FINAL 01/25/2020 1108    Other culture-see note  Medications: Scheduled Meds: . LORazepam  1 mg Intravenous Q12H  . phenytoin (DILANTIN) IV  100 mg Intravenous Q8H   Continuous Infusions: . sodium chloride 10 mL/hr at 01/29/20 1537  . lacosamide (VIMPAT) IV Stopped (01/29/20 1031)  . levETIRAcetam Stopped (01/29/20 1051)    Antimicrobials: Anti-infectives (From admission, onward)   Start     Dose/Rate Route Frequency Ordered Stop   01/26/20 1230  rifaximin (XIFAXAN) tablet 550 mg  Status:  Discontinued        550 mg Oral 2 times daily 01/26/20 1143 01/29/20 1501   01/26/20 1000  cefTRIAXone (ROCEPHIN) 2 g in sodium chloride 0.9 % 100 mL IVPB  Status:  Discontinued        2 g 200 mL/hr over 30 Minutes Intravenous Every 24 hours 01/25/20 2333 01/28/20 1639   01/25/20 1200  cefTRIAXone (ROCEPHIN) 1 g in sodium chloride 0.9 % 100 mL IVPB        1 g 200 mL/hr over 30 Minutes Intravenous  Once 01/25/20 1151 01/25/20 1234       Objective: Vitals: Today's Vitals   01/29/20 0500 01/29/20 0700 01/29/20 0800 01/29/20 1200  BP: (!) 81/60 (!) 84/47 101/76   Pulse: 71 73 75   Resp: 14 15 18    Temp:  (!) 97.4 F (36.3 C)  (!) 97.2 F (36.2 C)  TempSrc:  Axillary  Axillary  SpO2: 93% 91% 95%   Weight:      Height:      PainSc:        Intake/Output Summary (Last 24 hours) at 01/29/2020 1559 Last data filed at 01/29/2020 0847 Gross per 24 hour  Intake 4047.96 ml  Output 200 ml  Net 3847.96 ml   Filed Weights   01/25/20 1059 01/28/20 1822  Weight: 52 kg 54.8 kg   Weight change:    Intake/Output from previous day: 07/19 0701 - 07/20 0700 In: 3132 [I.V.:2976.2; IV Piggyback:155.7] Out: 415 [Urine:415] Intake/Output this shift: Total I/O In: 916 [I.V.:766; IV Piggyback:150] Out: -   Examination:  General exam: Lethargic, mumbling words cannula. HEENT:Oral mucosa  dry, Ear/Nose WNL grossly, dentition normal. Respiratory system: bilaterally basal crackles,no wheezing or crackles,no use of accessory muscle Cardiovascular system: S1 & S2 +, No JVD,. Gastrointestinal system: Abdomen soft, NT,ND, BS+ Nervous System: Lethargic, right leg is twitching/jerking every second. Extremities: No edema, distal peripheral pulses palpable.  Skin: No rashes, icterus present. MSK: thin muscle bulk,tone, power  Data Reviewed: I have personally reviewed following labs and imaging studies CBC: Recent Labs  Lab 01/25/20 1108 01/26/20 0621 01/27/20 1615 01/28/20 0625 01/29/20 0307  WBC 12.1* 12.5* 11.1* 10.7* 10.2  NEUTROABS 9.8*  --   --   --   --   HGB 11.5* 10.8* 7.7* 7.8* 7.0*  HCT 34.8* 32.1* 23.0* 24.7* 22.3*  MCV 105.5* 101.6* 104.1* 108.3* 110.4*  PLT 146* 100* 135* 134* 660*   Basic Metabolic Panel: Recent Labs  Lab 01/25/20 1108 01/26/20 0621 01/27/20 0700 01/28/20 0625 01/29/20 0307  NA 137 136 140 143 143  K 2.5* 3.2* 3.5 3.6 3.3*  CL 92* 94* 99 104 107  CO2 31 29 26 25 22   GLUCOSE 124* 81 99 124* 111*  BUN 25* 23 26* 27* 28*  CREATININE 1.34* 1.20* 1.65* 1.67* 1.61*  CALCIUM 7.6* 7.3* 7.9* 7.6* 7.6*  MG  --   --  1.4* 2.7*  --   PHOS  --   --  2.7  --   --    GFR: Estimated Creatinine Clearance: 28.1 mL/min (A) (by C-G formula based on SCr of 1.61 mg/dL (H)). Liver Function Tests: Recent Labs  Lab 01/25/20 1108 01/26/20 0621 01/27/20 0700 01/28/20 0625 01/29/20 0307  AST 75* 72* 51* 48* 42*  ALT 45* 38 30 27 25   ALKPHOS 142* 124 107 94 87  BILITOT 6.8* 5.7* 4.7* 3.9* 3.1*  PROT 6.0* 5.3* 5.5* 5.0* 5.2*  ALBUMIN 2.8* 2.3* 3.2* 3.0* 3.3*   Recent Labs  Lab 01/25/20 1108  LIPASE 46   Recent Labs  Lab 01/25/20 1052 01/27/20 1615 01/28/20 0625 01/29/20 0307  AMMONIA 52* 142* 16 62*   Coagulation Profile: Recent Labs  Lab 01/25/20 1108 01/27/20 0700 01/28/20 0625 01/29/20 0307  INR 1.4* 1.8* 1.8* 2.0*   Cardiac  Enzymes: No results for input(s): CKTOTAL, CKMB, CKMBINDEX, TROPONINI in the last 168 hours. BNP (last 3 results) No results for input(s): PROBNP in the last 8760 hours. HbA1C: No results for input(s): HGBA1C in the last 72 hours. CBG: No results for input(s): GLUCAP in the last 168 hours. Lipid Profile: No results for input(s): CHOL, HDL, LDLCALC, TRIG, CHOLHDL, LDLDIRECT in the last  72 hours. Thyroid Function Tests: Recent Labs    01/28/20 0625  TSH 0.686   Anemia Panel: Recent Labs    01/28/20 0625  VITAMINB12 1,107*   Sepsis Labs: Recent Labs  Lab 01/25/20 1052 01/25/20 1520  LATICACIDVEN 2.1* 1.9    Recent Results (from the past 240 hour(s))  Culture, blood (routine x 2)     Status: None (Preliminary result)   Collection Time: 01/25/20 11:08 AM   Specimen: BLOOD  Result Value Ref Range Status   Specimen Description   Final    BLOOD LEFT ANTECUBITAL Performed at [facility], [place] [address] [place], [place] [address]    Special Requests   Final    BOTTLES DRAWN AEROBIC AND ANAEROBIC Blood Culture adequate volume Performed at [place] [address], [place], [place] [address]    Culture   Final    NO GROWTH 4 DAYS Performed at [facility], [place] [address] [place], [place] [address]    Report Status PENDING  Incomplete  Urine culture     Status: None   Collection Time: 01/25/20 11:08 AM   Specimen: Urine, Clean Catch  Result Value Ref Range Status   Specimen Description   Final    URINE, CLEAN CATCH Performed at [facility], [place] [address], [place], [place] [address]    Special Requests   Final    NONE Performed at [facility], [place] [address], [place], [place] [address]    Culture   Final    NO GROWTH Performed at [facility], [place] [address] [place], [place] [address]    Report Status 01/26/2020 FINAL  Final  SARS Coronavirus 2 by RT PCR (hospital  order, performed in [facility] hospital lab) Nasopharyngeal Nasopharyngeal Swab     Status: None   Collection Time: 01/25/20 12:05 PM   Specimen: Nasopharyngeal Swab  Result Value Ref Range Status   SARS Coronavirus 2 NEGATIVE NEGATIVE Final    Comment: (NOTE) SARS-CoV-2 target nucleic acids are NOT DETECTED.  The SARS-CoV-2 RNA is generally detectable in upper and lower respiratory specimens during the acute phase of infection. The lowest concentration of SARS-CoV-2 viral copies this assay can detect is 250 copies / mL. A negative result does not preclude SARS-CoV-2 infection and should not be used as the sole basis for treatment or other patient management decisions.  A negative result may occur with improper specimen collection / handling, submission of specimen other than nasopharyngeal swab, presence of viral mutation(s) within the areas targeted by this assay, and inadequate number of viral copies (<250 copies / mL). A negative result must be combined with clinical observations, patient history, and epidemiological information.  Fact Sheet for Patients:   StrictlyIdeas.no  Fact Sheet for Healthcare Providers: BankingDealers.co.za  This test is not yet approved or  cleared by the [place] FDA and has been authorized for detection and/or diagnosis of SARS-CoV-2 by FDA under an Emergency Use Authorization (EUA).  This EUA will remain in effect (meaning this test can be used) for the duration of the COVID-19 declaration under Section 564(b)(1) of the Act, 21 U.S.C. section 360bbb-3(b)(1), unless the authorization is terminated or revoked sooner.  Performed at [facility], [place] [address], [place], [place] [address]       Radiology Studies: DG Chest 1 View  Result Date: 01/28/2020 CLINICAL DATA:  69 year old female with hypoxia. EXAM: CHEST  1 VIEW COMPARISON:  Chest  radiograph dated 01/25/2020.  FINDINGS: There is diffuse hazy density throughout the left lung, likely combination of layering pleural effusion and associated atelectasis. Pneumonia is not excluded. Clinical correlation is recommended. Probable small right pleural effusion and right lung base atelectasis. Surgical clips in the region of the right hilum with linear scarring similar to prior radiograph. No pneumothorax. Stable top-normal cardiac size. Atherosclerotic calcification of the aorta. No acute osseous pathology. Left shoulder arthroplasty. IMPRESSION: 1. Diffuse hazy density throughout the left lung, worsened since the prior radiograph and likely combination of layering effusion and associated atelectasis. Infiltrate is not excluded. 2. Unchanged appearance of the right lung. Electronically Signed   By: Anner Crete M.D.   On: 01/28/2020 19:44   EEG adult  Result Date: 01/28/2020 Lora Havens, MD     01/28/2020  2:40 PM Patient Name: CATINA NUSS MRN: [id number] Epilepsy Attending: Lora Havens Referring Physician/Provider: Dr Amie [place] Date: 01/28/2020 Duration: 24.14 mins Patient history: 69 year old woman with extensive past medical history of alcohol abuse, alcoholic cirrhosis with ascites, lung cancer, hypertension, coming in with progressive worsening mentation, refusal to take medication, on examination appears to be having right-sided focal seizures along with hepatic encephalopathy, with labs revealing ammonia in the 142 level range, worsening renal function and MRI that is suggestive of seizure edema on the left hemisphere with clinical correlate with right-sided twitching. EEG to evaluate for seizure Level of alertness:  lethargic AEDs during EEG study: PHT, LEV Technical aspects: This EEG study was done with scalp electrodes positioned according to the 10-20 International system of electrode placement. Electrical activity was acquired at a sampling rate of 500Hz  and reviewed with a high frequency filter of  70Hz  and a low frequency filter of 1Hz . EEG data were recorded continuously and digitally stored. 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 There is also continuous generalized 3 to 6 Hz theta-delta slowing.  Hyperventilation and photic stimulation were not performed.   ABNORMALITY -Periodic epileptiform discharges, left hemisphere, maximal left posterior quadranr -Continuous slow, generalized IMPRESSION: This study showed evidence of epileptogenicity in left hemisphere, maximal left posterior quadrant likely secondary to underlying cortical  abnormality. The periodic epileptiform discharges discharges have overlying fast activity and at times are rhythmic which is on the ictal-interictal continuum with high potential for seizures. Additionally, there is evidence of severe diffuse encephalopathy, non specific to etiology. NO definite seizures were seen. Dr Aroor was immediately notified. Lora Havens     LOS: 4 days   Antonieta Pert, MD [facility]  01/29/2020, 3:59 PM

## 2020-01-29 NOTE — Progress Notes (Signed)
SLP Cancellation Note  Patient Details Name: [name] MRN: [id number] DOB: [birth year]   Cancelled treatment:       Reason Eval/Treat Not Completed: Other (comment) (pt remains inappropriate for po, not alert)  Kathleen Lime, MS [facility] SLP Acute Rehab Services Office [phone]  Macario Golds 01/29/2020, 1:01 PM

## 2020-01-30 DIAGNOSIS — G40909 Epilepsy, unspecified, not intractable, without status epilepticus: Secondary | ICD-10-CM

## 2020-01-30 LAB — CULTURE, BLOOD (ROUTINE X 2)
Culture: NO GROWTH
Special Requests: ADEQUATE

## 2020-01-30 NOTE — Progress Notes (Signed)
PROGRESS NOTE  [name] JQB:[id number] DOB: [birth year] DOA: 01/25/2020 PCP: Wenda Low, MD   Chief Complaint  Patient presents with  . Altered Mental Status    Brief Narrative: 69 y.o. female with medical history significant of alcoholic cirrhosis with ascites, hx of etoh abuse, tobacco abuse, hx lung cancer, tongue tumor, HTN presents to ED from SNF for increased confusion, refusing meds and increased jaundice, edema. She was discharged recently on 7/5, admitted 01/04/20:following admission for treatment of ETOH hepatitis with ascites, encephalopathy/?wernicke's- felt unsafe for home due to confusion and sent to SNF. prior to last admit heavy etoh use 2 bottles wine/daily, living by herself. Since discharge to SNF,pt has been refusing meds and food per daughter and noted to be more edematous after not taking meds. ED Course:In the ED,pt found to have K of 2.5, Cr of 1.34, ammonia of 52. Total bilirubin was elevated at 6.8 (from 2.9 at discharge). UA was notable for 6-10 WBC's with pos nitrites. Cultures were obtained and pt started on empiric rocephin.Hospitalist consulted for consideration for admission. Patient was admitted seen by gastroenterologist underwent ultrasound and CT abdomen that showed evidence of cirrhosis, stable 12 mm nodule low-grade bronchogenic carcinoma, stool impaction. Patient was being managed with lactulose, please call disimpaction, supportive measures. She was noticed to have increasing twitching on the right side, MRI brain was done 7/18 limited by motion but did show evidence of a stable seizure edema on the left hemisphere with clinical correlate with right-sided twitching.Neurology urgently evaluated the patient and discussed with the daughter and the niece-option was given regarding transfer to [facility] and monitoring on continuous EEG but given that patient is DO NOT RESUSCITATE DO NOT INTUBATE as per her wishes family declined transfer wanted to see how she  does with meds, and patient was given Keppra, Dilantin and EEG was ordered.  EEG next morning showed evidence of epileptogenic discharges.-Neurology discussed with patient's daughter and after discussion patient was tried on additional AEDs to see if she improves, if she does not improve comfort measure was advised   Subjective: Patient noted to be somnolent.  Not very responsive.    Assessment & Plan:  Acute encephalopathy/Hepatic encephalopathy in the setting of cirrhosis/UTI On last d/c 01/14/20-alert to person and place only. There was concern for wernicke's encephalopathy-felt unsafe for home due to confusion and sent to SNF. Prior to last admit heavy etoh use 2 bottles wine/daily, living by herself. MRI brain 7/18 limited by motion but did show evidence of seizure edema on the left hemisphere with clinical correlate with right-sided twitching.  Neurology urgently evaluated the patient 7/18- and discussed with the daughter and the niece-option was given regarding transfer to [facility] and monitoring on continuous EEG but given that patient is DO NOT RESUSCITATE DO NOT INTUBATE as per her wishes, family declined transfer and wanted to see how she does and patient was given Keppra, Dilantin and EEG was ordered for the morning-EEG showed evidence of epileptogenic discharge in left hemisphere, maximal left posterior quadrant, periodic epileptiform discharges have overlying fast activity at times are rhythmic which is on the ictal-interictal continuum with a high potential for seizures, additionally with evidence of severe diffuse encephalopathy nonspecific etiology.   Patient was placed on Vimpat, scheduled Ativan, transferred to stepdown unit to monitor on AEDs.  Did not improve despite additional AEDs.  Case was discussed with neurology.  Recommendation for was either to intubate or do a burst suppression versus comfort measures.  Palliative medicine was  following.  Discussions were held with family.   Patient was subsequently transitioned to comfort care.  It was felt that for comfort reasons she should be continued on antiepileptics.  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  Did not improve despite multiple AEDs.  Now comfort measures.  Acute alcoholic hepatitis/alcoholic liver disease with Hypoalbuminemia Patient with liver failure and coagulopathy with worsening INR.  Also noted to have elevated ammonia level.  Has been on lactulose.   CT abdomen showed hepatic cirrhosis. Diuretics-lasix/aldactone were on hold due to AKI. Last admission she had ultrasound-guided paracentesis yielding 2.4 L fluid.   MELD score is 23  Hypotension Likely multifactorial from volume depletion, hypoalbuminemia, from hypoalbuminemia in the setting of liver cirrhosis, antiseizure medication.  Per daughter no escalation of care.  Blood pressure remained low despite IV fluid hydration, multiple dose of albumin.  Acute hypoxic respiratory failure In the setting of encephalopathy, also layering pleural effusion atelectasis from associated hypoalbuminemia/third spacing.  Patient has been on 4 L nasal cannula in the stepdown unit. Remains on oxygen, unable to use Lasix due to hypotension/AKI.  AKI Creatinine admission 1.30, creatinine remains up at 1.6.on IV fluids/albumin. Foley catheter in place.  . Hypomagnesemia was repleted. Hypokalemia repleted.  Acute lower UTI POA S/p 4 doses of ceftriaxone.  Cultures have been negative.  Antibiotics were discontinued. Mild lactic acidosis has resolved.   COPD Chest x-ray unremarkable.  Thrombocytopenia Platelet count is low but stable.  Likely due to her liver disease.  Low-grade bronchogenic carcinoma noted in the CT scan Also history of mouth tumor.  Patient apparently did not want to pursue further management of these suspected malignancies.   Macrocytic anemia Suspect from alcohol abuse/from  chronic liver disease.  Fecal impaction/constipation Was manually disimpacted and also received Dulcolax suppository 7/17.  Also on lactulose enema  Essential Tremors hx As per daughter she is on pimodine for same. She has tremors on both of her hand and at times voice gets shaky from her tremors as per daughter.  Moderate protein calorie malnutrition   GOC DNR.  After extensive palliative meeting and based on patient's poor health status, and after discussion with the neurology and given ongoing seizure despite multiple MEDICATIONS, family elected to transition to comfort measures.  DVT prophylaxis:  lovenox held due to dropping platelet count and with INR being up Code Status:   Code Status: DNR  Family Communication: No family at bedside today.  Status is: Inpatient  Remains inpatient appropriate because ongoing encephalopathy, seizure  Dispo: The patient is from: SNF              Anticipated d/c is ZW:CHENIDP              Anticipated d/c date is: 1-2 days              Patient currently is not medically stable to d/c.  Diet Order            Diet NPO time specified Except for: Other (See Comments)  Diet effective now                 Body mass index is 20.74 kg/m. Pressure Ulcer: skin team in rt arm.  Consultants: Palliative care.  Neurology. Procedures:  CT ABDOMEND reviewed as below: "1. Findings consistent with hepatic cirrhosis.Moderate ascites isnoted. 2. Bilateral nonobstructive nephrolithiasis. No hydronephrosis or renal obstruction is noted. 3. Large amount of stool is noted in the rectum consistent with  impaction. 4. Stable 12 mm nodule is noted in left lower lobe laterally. This may represent low-grade bronchogenic carcinoma as described on the prior PET scan of June 21, 2018"  Microbiology:see note Blood Culture    Component Value Date/Time   SDES  01/25/2020 1108    BLOOD LEFT ANTECUBITAL Performed at [facility], [place]  [address], [place], [place] [address]    SDES  01/25/2020 1108    URINE, CLEAN CATCH Performed at [facility], [place] [address] [place], [place] [address]    SPECREQUEST  01/25/2020 1108    BOTTLES DRAWN AEROBIC AND ANAEROBIC Blood Culture adequate volume Performed at [place] [address], [place], [place] [address]    SPECREQUEST  01/25/2020 1108    NONE Performed at [facility], [place] [address] [place], [place] [address]    CULT  01/25/2020 1108    NO GROWTH 5 DAYS Performed at [facility], [place] [address] [place], [place] [address]    CULT  01/25/2020 1108    NO GROWTH Performed at [facility], [place] [address], [place], [place] [address]    REPTSTATUS 01/30/2020 FINAL 01/25/2020 1108   REPTSTATUS 01/26/2020 FINAL 01/25/2020 1108    Other culture-see note  Medications: Scheduled Meds: . LORazepam  1 mg Intravenous Q12H  . phenytoin (DILANTIN) IV  100 mg Intravenous Q8H   Continuous Infusions: . sodium chloride 10 mL/hr at 01/29/20 1537  . lacosamide (VIMPAT) IV Stopped (01/29/20 2200)  . levETIRAcetam Stopped (01/29/20 2238)    Antimicrobials: Anti-infectives (From admission, onward)   Start     Dose/Rate Route Frequency Ordered Stop   01/26/20 1230  rifaximin (XIFAXAN) tablet 550 mg  Status:  Discontinued        550 mg Oral 2 times daily 01/26/20 1143 01/29/20 1501   01/26/20 1000  cefTRIAXone (ROCEPHIN) 2 g in sodium chloride 0.9 % 100 mL IVPB  Status:  Discontinued        2 g 200 mL/hr over 30 Minutes Intravenous Every 24 hours 01/25/20 2333 01/28/20 1639   01/25/20 1200  cefTRIAXone (ROCEPHIN) 1 g in sodium chloride 0.9 % 100 mL IVPB        1 g 200 mL/hr over 30 Minutes Intravenous  Once 01/25/20 1151 01/25/20 1234       Objective: Vitals: Today's Vitals   01/29/20 2000 01/30/20 0000 01/30/20 0700 01/30/20 0752  BP:    (!) 142/64  Pulse: 64 61 (!) 47 (!) 52  Resp: 14 14 14 14     Temp:      TempSrc:      SpO2: 100% 100% 100% 100%  Weight:      Height:      PainSc:    Asleep    Intake/Output Summary (Last 24 hours) at 01/30/2020 0932 Last data filed at 01/30/2020 0752 Gross per 24 hour  Intake 1051.16 ml  Output 150 ml  Net 901.16 ml   Filed Weights   01/25/20 1059 01/28/20 1822  Weight: 52 kg 54.8 kg   Weight change:    Intake/Output from previous day: 07/20 0701 - 07/21 0700 In: 1967.2 [I.V.:1417.5; IV Piggyback:549.7] Out: 150 [Urine:150] Intake/Output this shift: No intake/output data recorded.  Examination:  General appearance: Poorly responsive. Resp: Shallow respirations.  Few crackles at the bases. Cardio: S1-S2 is normal regular.  No S3-S4.  No rubs murmurs or bruit GI: Abdomen is soft.  Nontender nondistended.  Bowel sounds  are present normal.  No masses organomegaly Neurologic: No obvious seizure activity noted.  Otherwise she is unresponsive.   Data Reviewed: I have personally reviewed following labs and imaging studies CBC: Recent Labs  Lab 01/25/20 1108 01/26/20 0621 01/27/20 1615 01/28/20 0625 01/29/20 0307  WBC 12.1* 12.5* 11.1* 10.7* 10.2  NEUTROABS 9.8*  --   --   --   --   HGB 11.5* 10.8* 7.7* 7.8* 7.0*  HCT 34.8* 32.1* 23.0* 24.7* 22.3*  MCV 105.5* 101.6* 104.1* 108.3* 110.4*  PLT 146* 100* 135* 134* 621*   Basic Metabolic Panel: Recent Labs  Lab 01/25/20 1108 01/26/20 0621 01/27/20 0700 01/28/20 0625 01/29/20 0307  NA 137 136 140 143 143  K 2.5* 3.2* 3.5 3.6 3.3*  CL 92* 94* 99 104 107  CO2 31 29 26 25 22   GLUCOSE 124* 81 99 124* 111*  BUN 25* 23 26* 27* 28*  CREATININE 1.34* 1.20* 1.65* 1.67* 1.61*  CALCIUM 7.6* 7.3* 7.9* 7.6* 7.6*  MG  --   --  1.4* 2.7*  --   PHOS  --   --  2.7  --   --    GFR: Estimated Creatinine Clearance: 28.1 mL/min (A) (by C-G formula based on SCr of 1.61 mg/dL (H)). Liver Function Tests: Recent Labs  Lab 01/25/20 1108 01/26/20 0621 01/27/20 0700 01/28/20 0625  01/29/20 0307  AST 75* 72* 51* 48* 42*  ALT 45* 38 30 27 25   ALKPHOS 142* 124 107 94 87  BILITOT 6.8* 5.7* 4.7* 3.9* 3.1*  PROT 6.0* 5.3* 5.5* 5.0* 5.2*  ALBUMIN 2.8* 2.3* 3.2* 3.0* 3.3*   Recent Labs  Lab 01/25/20 1108  LIPASE 46   Recent Labs  Lab 01/25/20 1052 01/27/20 1615 01/28/20 0625 01/29/20 0307  AMMONIA 52* 142* 16 62*   Coagulation Profile: Recent Labs  Lab 01/25/20 1108 01/27/20 0700 01/28/20 0625 01/29/20 0307  INR 1.4* 1.8* 1.8* 2.0*   Thyroid Function Tests: Recent Labs    01/28/20 0625  TSH 0.686   Anemia Panel: Recent Labs    01/28/20 0625  VITAMINB12 1,107*   Sepsis Labs: Recent Labs  Lab 01/25/20 1052 01/25/20 1520  LATICACIDVEN 2.1* 1.9    Recent Results (from the past 240 hour(s))  Culture, blood (routine x 2)     Status: None   Collection Time: 01/25/20 11:08 AM   Specimen: BLOOD  Result Value Ref Range Status   Specimen Description   Final    BLOOD LEFT ANTECUBITAL Performed at [facility], [place] [address], [place], [place] [address]    Special Requests   Final    BOTTLES DRAWN AEROBIC AND ANAEROBIC Blood Culture adequate volume Performed at [place] [address] [place], [place] [address]    Culture   Final    NO GROWTH 5 DAYS Performed at [facility], [place] [address] [place], [place] [address]    Report Status 01/30/2020 FINAL  Final  Urine culture     Status: None   Collection Time: 01/25/20 11:08 AM   Specimen: Urine, Clean Catch  Result Value Ref Range Status   Specimen Description   Final    URINE, CLEAN CATCH Performed at [facility], [place] [address] [place], [place] [address]    Special Requests   Final    NONE Performed at [facility], [place] [address], [place], [place] [address]    Culture   Final    NO  GROWTH Performed at [facility], [place] [address], [place], [place] [address]    Report Status  01/26/2020 FINAL  Final  SARS Coronavirus 2 by RT PCR (hospital order, performed in [facility] hospital lab) Nasopharyngeal Nasopharyngeal Swab     Status: None   Collection Time: 01/25/20 12:05 PM   Specimen: Nasopharyngeal Swab  Result Value Ref Range Status   SARS Coronavirus 2 NEGATIVE NEGATIVE Final    Comment: (NOTE) SARS-CoV-2 target nucleic acids are NOT DETECTED.  The SARS-CoV-2 RNA is generally detectable in upper and lower respiratory specimens during the acute phase of infection. The lowest concentration of SARS-CoV-2 viral copies this assay can detect is 250 copies / mL. A negative result does not preclude SARS-CoV-2 infection and should not be used as the sole basis for treatment or other patient management decisions.  A negative result may occur with improper specimen collection / handling, submission of specimen other than nasopharyngeal swab, presence of viral mutation(s) within the areas targeted by this assay, and inadequate number of viral copies (<250 copies / mL). A negative result must be combined with clinical observations, patient history, and epidemiological information.  Fact Sheet for Patients:   StrictlyIdeas.no  Fact Sheet for Healthcare Providers: BankingDealers.co.za  This test is not yet approved or  cleared by the [place] FDA and has been authorized for detection and/or diagnosis of SARS-CoV-2 by FDA under an Emergency Use Authorization (EUA).  This EUA will remain in effect (meaning this test can be used) for the duration of the COVID-19 declaration under Section 564(b)(1) of the Act, 21 U.S.C. section 360bbb-3(b)(1), unless the authorization is terminated or revoked sooner.  Performed at [facility], [place] [address], [place], [place] [address]       Radiology Studies: DG Chest 1 View  Result Date: 01/28/2020 CLINICAL DATA:  69 year old female with hypoxia. EXAM:  CHEST  1 VIEW COMPARISON:  Chest radiograph dated 01/25/2020. FINDINGS: There is diffuse hazy density throughout the left lung, likely combination of layering pleural effusion and associated atelectasis. Pneumonia is not excluded. Clinical correlation is recommended. Probable small right pleural effusion and right lung base atelectasis. Surgical clips in the region of the right hilum with linear scarring similar to prior radiograph. No pneumothorax. Stable top-normal cardiac size. Atherosclerotic calcification of the aorta. No acute osseous pathology. Left shoulder arthroplasty. IMPRESSION: 1. Diffuse hazy density throughout the left lung, worsened since the prior radiograph and likely combination of layering effusion and associated atelectasis. Infiltrate is not excluded. 2. Unchanged appearance of the right lung. Electronically Signed   By: Anner Crete M.D.   On: 01/28/2020 19:44   EEG adult  Result Date: 01/28/2020 Lora Havens, MD     01/28/2020  2:40 PM Patient Name: [name] MRN: [id number] Epilepsy Attending: Lora Havens Referring Physician/Provider: Dr Amie [place] Date: 01/28/2020 Duration: 24.14 mins Patient history: 69 year old woman with extensive past medical history of alcohol abuse, alcoholic cirrhosis with ascites, lung cancer, hypertension, coming in with progressive worsening mentation, refusal to take medication, on examination appears to be having right-sided focal seizures along with hepatic encephalopathy, with labs revealing ammonia in the 142 level range, worsening renal function and MRI that is suggestive of seizure edema on the left hemisphere with clinical correlate with right-sided twitching. EEG to evaluate for seizure Level of alertness:  lethargic AEDs during EEG study: PHT, LEV Technical aspects: This EEG study was done with scalp electrodes positioned according to the 10-20 International system  of electrode placement. Electrical activity was acquired at a  sampling rate of 500Hz  and reviewed with a high frequency filter of 70Hz  and a low frequency filter of 1Hz . EEG data were recorded continuously and digitally stored. 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 There is also continuous generalized 3 to 6 Hz theta-delta slowing.  Hyperventilation and photic stimulation were not performed.   ABNORMALITY -Periodic epileptiform discharges, left hemisphere, maximal left posterior quadranr -Continuous slow, generalized IMPRESSION: This study showed evidence of epileptogenicity in left hemisphere, maximal left posterior quadrant likely secondary to underlying cortical  abnormality. The periodic epileptiform discharges discharges have overlying fast activity and at times are rhythmic which is on the ictal-interictal continuum with high potential for seizures. Additionally, there is evidence of severe diffuse encephalopathy, non specific to etiology. NO definite seizures were seen. Dr Aroor was immediately notified. Lora Havens     LOS: 5 days   Bonnielee Haff, MD [facility]  01/30/2020, 9:32 AM

## 2020-01-30 NOTE — Progress Notes (Signed)
   Daily Progress Note   Patient Name: [name]       Date: 01/30/2020 DOB: 06-13-1951  Age: 69 y.o. MRN#: [id number] Attending Physician: Bonnielee Haff, MD Primary Care Physician: Wenda Low, MD Admit Date: 01/25/2020  Reason for Consultation/Follow-up: Establishing goals of care  Subjective: Patient unresponsive. No acute distress noted. No obvious seizure activity noted. No family at the bedside.   Called and spoke with step-daughter/HCPOA, [name] at length. Updates provided. She reports she has been speaking with family and providing updates. Is planning to visit later today. She is aware patient appears comfortable and in no distress. Reviewed medications given as requested.   Goals remain clear to continue with comfort/EOL care. Daughter aware patient may transfer off the unit later today.   All questions answered and support given.   Length of Stay: 5 days  Vital Signs: BP (!) 142/64 (BP Location: Left Arm)   Pulse (!) 45   Temp (!) 97.2 F (36.2 C) (Axillary) Comment: RN Notified warm blankets applied  Resp 14   Ht 5\' 4"  (1.626 m)   Wt 54.8 kg   SpO2 100%   BMI 20.74 kg/m  SpO2: SpO2: 100 % O2 Device: O2 Device: Nasal Cannula O2 Flow Rate: O2 Flow Rate (L/min): 2 L/min  Physical Exam: -minimally responsive, chronically-ill  -shallow respirations  Palliative Care Assessment & Plan    Code Status:  DNR  Goals of Care/Recommendations:  Continue with full comfort care measures  Will consider discontinue some of her anti-epileptics in the setting of full comfort and unresponsive. She will need to continue on some to prevent seizure activity.   Anticipated hospital death  PMT will continue to support and follow.    Prognosis: Hours - Days  Discharge Planning: Anticipated Hospital Death  Thank you for allowing the Palliative Medicine Team to assist in the care of this patient.  Time Total: 35 min  Visit consisted of counseling and education  dealing with the complex and emotionally intense issues of symptom management and palliative care in the setting of serious and potentially life-threatening illness.Greater than 50%  of this time was spent counseling and coordinating care related to the above assessment and plan.  Alda Lea, AGPCNP-BC  Palliative Medicine Team [phone]

## 2020-01-31 DIAGNOSIS — G40901 Epilepsy, unspecified, not intractable, with status epilepticus: Secondary | ICD-10-CM

## 2020-01-31 NOTE — Progress Notes (Signed)
Ms. Trego step-daughter (of +35 years) [name], was bedside when I arrived. Pt remained undisturbed until it was time for prayer, the purpose of our visit. She did not respond when called or during prayer. I talked for a while w/Angela about Ailana. She talked about how she did not take care of herself; she drank a lot; and had stopped taking care of the house w/her dog.  She said although she was the step daughter, pt got along with her better than some of her blood-related family members. Pt never had children of her own. [name] said pt was [employer] growing up and she wanted someone to come in and pray for her, which was her reason for requesting a Chaplain. She vacillated about whether to go home. She didn't want to feel guilty if Adelei passed. We talked about that and she talked about all she has done for her, including [place] of her.  I commended her about that, especially as the step-daughter. She described [place] as someone who could be difficult to get along with and preferred to be alone. We talked about the potential for [name] to say all that she needed to say to [facility] in case she does pass when she leaves. She liked that idea and said she would like to do that. After additional small talk, I prayed, as requested, and left [name] to talk to [place].  She was very thankful for the visit and prayer. Please page if additional support is needed. [place], [place]   01/31/20 2000  Clinical Encounter Type  Visited With Family

## 2020-01-31 NOTE — Care Management Important Message (Signed)
Important Message  Patient Details IM Letter given to Marney Doctor RN Case Manager to present to the Patient Name: [name] MRN: [id number] Date of Birth: [birth year]   Medicare Important Message Given:  Yes     Kerin Salen 01/31/2020, 9:23 AM

## 2020-01-31 NOTE — Progress Notes (Signed)
PROGRESS NOTE  [name] ZOX:[id number] DOB: 22-Oct-1950 DOA: 01/25/2020 PCP: Wenda Low, MD   Brief Narrative: 69 y.o. female with medical history significant of alcoholic cirrhosis with ascites, hx of etoh abuse, tobacco abuse, hx lung cancer, tongue tumor, HTN presents to ED from SNF for increased confusion, refusing meds and increased jaundice, edema. She was discharged recently on 7/5, admitted 01/04/20:following admission for treatment of ETOH hepatitis with ascites, encephalopathy/?wernicke's- felt unsafe for home due to confusion and sent to SNF. prior to last admit heavy etoh use 2 bottles wine/daily, living by herself. Since discharge to SNF,pt has been refusing meds and food per daughter and noted to be more edematous after not taking meds. ED Course:In the ED,pt found to have K of 2.5, Cr of 1.34, ammonia of 52. Total bilirubin was elevated at 6.8 (from 2.9 at discharge). UA was notable for 6-10 WBC's with pos nitrites. Cultures were obtained and pt started on empiric rocephin.Hospitalist consulted for consideration for admission. Patient was admitted seen by gastroenterologist underwent ultrasound and CT abdomen that showed evidence of cirrhosis, stable 12 mm nodule low-grade bronchogenic carcinoma, stool impaction. Patient was being managed with lactulose, please call disimpaction, supportive measures. She was noticed to have increasing twitching on the right side, MRI brain was done 7/18 limited by motion but did show evidence of a stable seizure edema on the left hemisphere with clinical correlate with right-sided twitching.Neurology urgently evaluated the patient and discussed with the daughter and the niece-option was given regarding transfer to [facility] and monitoring on continuous EEG but given that patient is DO NOT RESUSCITATE DO NOT INTUBATE as per her wishes family declined transfer wanted to see how she does with meds, and patient was given Keppra, Dilantin and EEG was  ordered.  EEG next morning showed evidence of epileptogenic discharges.-Neurology discussed with patient's daughter and after discussion patient was tried on additional AEDs to see if she improves, if she does not improve comfort measure was advised   Subjective: Patient not responsive.  Friends and family at bedside.  Assessment & Plan:  Acute encephalopathy/Hepatic encephalopathy in the setting of cirrhosis/UTI During previous hospitalization there was concern for Wernicke's encephalopathy.  Due to safety concern she was sent to skilled nursing facility.  Prior to previous admission she did have a history of significant alcohol use.   MRI brain 7/18 limited by motion but did show evidence of seizure edema on the left hemisphere with clinical correlate with right-sided twitching.  Neurology urgently evaluated the patient 7/18- and discussed with the daughter and the niece-option was given regarding transfer to [facility] and monitoring on continuous EEG but given that patient is DO NOT RESUSCITATE DO NOT INTUBATE as per her wishes, family declined transfer and wanted to see how she does and patient was given Keppra, Dilantin and EEG was ordered for the morning. EEG showed evidence of epileptogenic discharge in left hemisphere, maximal left posterior quadrant, periodic epileptiform discharges have overlying fast activity at times are rhythmic which is on the ictal-interictal continuum with a high potential for seizures, additionally with evidence of severe diffuse encephalopathy nonspecific etiology.   Despite adding Vimpat to Dilantin and Keppra patient did not improve. Case was discussed with neurology.  Recommendation for was either to intubate or do a burst suppression versus comfort measures.   Palliative care was following.  Discussions were held with family.  Subsequently she was transitioned to comfort care.   Comfort measures in place.  Patient is poorly responsive.  Anticipate in-hospital  death.  Continue Keppra.  Will discontinue Dilantin.  Can use Ativan for seizure type activity.  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  Did not improve despite multiple AEDs.  Now comfort measures.  Acute alcoholic hepatitis/alcoholic liver disease with Hypoalbuminemia Patient with liver failure and coagulopathy with worsening INR.  Also noted to have elevated ammonia level.  Has been on lactulose.   CT abdomen showed hepatic cirrhosis. Diuretics-lasix/aldactone were on hold due to AKI. Last admission she had ultrasound-guided paracentesis yielding 2.4 L fluid.   MELD score 23  Hypotension Likely multifactorial from volume depletion, hypoalbuminemia, from hypoalbuminemia in the setting of liver cirrhosis, antiseizure medication.    Acute hypoxic respiratory failure In the setting of encephalopathy, also layering pleural effusion atelectasis from associated hypoalbuminemia/third spacing.  AKI Likely multifactorial etiology.  Acute lower UTI POA S/p 4 doses of ceftriaxone.  Cultures have been negative.  Antibiotics were discontinued. Mild lactic acidosis had resolved.   COPD Chest x-ray unremarkable.  Thrombocytopenia Platelet count is low but stable.  Likely due to her liver disease.  Low-grade bronchogenic carcinoma noted in the CT scan Also history of mouth tumor.  Patient apparently did not want to pursue further management of these suspected malignancies.   Macrocytic anemia Suspect from alcohol abuse/from chronic liver disease.  Fecal impaction/constipation Was manually disimpacted and also received Dulcolax suppository 7/17.  Also on lactulose enema  Essential Tremors hx As per daughter she is on pimodine for same. She has tremors on both of her hand and at times voice gets shaky from her tremors as per daughter.  Moderate protein calorie malnutrition   GOC DNR.  Comfort measures.  DVT prophylaxis:  Comfort measures Code Status: DNR Family Communication: Discussed with family.  Status is: Inpatient  Remains inpatient appropriate because:Inpatient level of care appropriate due to severity of illness   Dispo:  Patient From: Home  Planned Disposition: Anticipate in-hospital death  Expected discharge date: 01/31/20  Medically stable for discharge: No    Diet Order            Diet NPO time specified Except for: Other (See Comments)  Diet effective now                 Body mass index is 20.74 kg/m. Pressure Ulcer: skin team in rt arm.  Consultants: Palliative care.  Neurology.   Microbiology: Blood Culture    Component Value Date/Time   SDES  01/25/2020 1108    BLOOD LEFT ANTECUBITAL Performed at [facility], [place] [address], [place], [place] [address]    SDES  01/25/2020 1108    URINE, CLEAN CATCH Performed at [facility], [place] [address] [place], [place] [address]    SPECREQUEST  01/25/2020 1108    BOTTLES DRAWN AEROBIC AND ANAEROBIC Blood Culture adequate volume Performed at [facility] [address], [place], [place] [address]    SPECREQUEST  01/25/2020 1108    NONE Performed at [facility], [place] [address] [place], [place] [address]    CULT  01/25/2020 1108    NO GROWTH 5 DAYS Performed at [facility], [place] [address] [place], [place] [address]    CULT  01/25/2020 1108    NO GROWTH Performed at [facility], [place] [address], [place], [place] [address]    REPTSTATUS 01/30/2020 FINAL 01/25/2020 1108   REPTSTATUS 01/26/2020 FINAL 01/25/2020 1108  Other culture-see note  Medications: Scheduled Meds: . LORazepam  1 mg Intravenous Q12H   Continuous Infusions: . sodium chloride 10 mL/hr at 01/29/20 1537  . levETIRAcetam 750 mg (01/31/20 1111)    Antimicrobials: Anti-infectives (From admission, onward)   Start     Dose/Rate Route Frequency Ordered Stop    01/26/20 1230  rifaximin (XIFAXAN) tablet 550 mg  Status:  Discontinued        550 mg Oral 2 times daily 01/26/20 1143 01/29/20 1501   01/26/20 1000  cefTRIAXone (ROCEPHIN) 2 g in sodium chloride 0.9 % 100 mL IVPB  Status:  Discontinued        2 g 200 mL/hr over 30 Minutes Intravenous Every 24 hours 01/25/20 2333 01/28/20 1639   01/25/20 1200  cefTRIAXone (ROCEPHIN) 1 g in sodium chloride 0.9 % 100 mL IVPB        1 g 200 mL/hr over 30 Minutes Intravenous  Once 01/25/20 1151 01/25/20 1234       Objective: Vitals: Today's Vitals   01/30/20 0800 01/30/20 0900 01/30/20 1000 01/30/20 1100  BP:      Pulse: (!) 48 (!) 47 (!) 45   Resp: 13 13 14 14   Temp:      TempSrc:      SpO2: 100% 100% 100% 90%  Weight:      Height:      PainSc:   Asleep     Intake/Output Summary (Last 24 hours) at 01/31/2020 1138 Last data filed at 01/30/2020 1500 Gross per 24 hour  Intake 108 ml  Output 100 ml  Net 8 ml   Filed Weights   01/25/20 1059 01/28/20 1822  Weight: 52 kg 54.8 kg   Weight change:    Intake/Output from previous day: 07/21 0701 - 07/22 0700 In: 211.6 [IV Piggyback:211.6] Out: 100 [Urine:100] Intake/Output this shift: No intake/output data recorded.  Examination:  Patient not responsive.  Shallow respirations noted.   Data Reviewed: I have personally reviewed following labs and imaging studies CBC: Recent Labs  Lab 01/25/20 1108 01/26/20 0621 01/27/20 1615 01/28/20 0625 01/29/20 0307  WBC 12.1* 12.5* 11.1* 10.7* 10.2  NEUTROABS 9.8*  --   --   --   --   HGB 11.5* 10.8* 7.7* 7.8* 7.0*  HCT 34.8* 32.1* 23.0* 24.7* 22.3*  MCV 105.5* 101.6* 104.1* 108.3* 110.4*  PLT 146* 100* 135* 134* 631*   Basic Metabolic Panel: Recent Labs  Lab 01/25/20 1108 01/26/20 0621 01/27/20 0700 01/28/20 0625 01/29/20 0307  NA 137 136 140 143 143  K 2.5* 3.2* 3.5 3.6 3.3*  CL 92* 94* 99 104 107  CO2 31 29 26 25 22   GLUCOSE 124* 81 99 124* 111*  BUN 25* 23 26* 27* 28*    CREATININE 1.34* 1.20* 1.65* 1.67* 1.61*  CALCIUM 7.6* 7.3* 7.9* 7.6* 7.6*  MG  --   --  1.4* 2.7*  --   PHOS  --   --  2.7  --   --    GFR: Estimated Creatinine Clearance: 28.1 mL/min (A) (by C-G formula based on SCr of 1.61 mg/dL (H)). Liver Function Tests: Recent Labs  Lab 01/25/20 1108 01/26/20 0621 01/27/20 0700 01/28/20 0625 01/29/20 0307  AST 75* 72* 51* 48* 42*  ALT 45* 38 30 27 25   ALKPHOS 142* 124 107 94 87  BILITOT 6.8* 5.7* 4.7* 3.9* 3.1*  PROT 6.0* 5.3* 5.5* 5.0* 5.2*  ALBUMIN 2.8* 2.3* 3.2* 3.0* 3.3*   Recent Labs  Lab 01/25/20  [place]  Lab 01/25/20 1052 01/27/20 1615 01/28/20 0625 01/29/20 0307  AMMONIA 52* 142* 16 62*   Coagulation Profile: Recent Labs  Lab 01/25/20 1108 01/27/20 0700 01/28/20 0625 01/29/20 0307  INR 1.4* 1.8* 1.8* 2.0*   Sepsis Labs: Recent Labs  Lab 01/25/20 1052 01/25/20 1520  LATICACIDVEN 2.1* 1.9    Recent Results (from the past 240 hour(s))  Culture, blood (routine x 2)     Status: None   Collection Time: 01/25/20 11:08 AM   Specimen: BLOOD  Result Value Ref Range Status   Specimen Description   Final    BLOOD LEFT ANTECUBITAL Performed at [facility], [place] [address] [place], [place] [address]    Special Requests   Final    BOTTLES DRAWN AEROBIC AND ANAEROBIC Blood Culture adequate volume Performed at [facility] [address] [place], [place] [address]    Culture   Final    NO GROWTH 5 DAYS Performed at [facility], [place] [address] [place], [place] [address]    Report Status 01/30/2020 FINAL  Final  Urine culture     Status: None   Collection Time: 01/25/20 11:08 AM   Specimen: Urine, Clean Catch  Result Value Ref Range Status   Specimen Description   Final    URINE, CLEAN CATCH Performed at [facility], [place] [address] [place], [place] [address]    Special Requests   Final    NONE Performed at [facility], [place] [address] [place], [place] [address]    Culture   Final    NO GROWTH Performed at [facility], [place] [address] [place], [place] [address]    Report Status 01/26/2020 FINAL  Final  SARS Coronavirus 2 by RT PCR (hospital order, performed in [facility] hospital lab) Nasopharyngeal Nasopharyngeal Swab     Status: None   Collection Time: 01/25/20 12:05 PM   Specimen: Nasopharyngeal Swab  Result Value Ref Range Status   SARS Coronavirus 2 NEGATIVE NEGATIVE Final    Comment: (NOTE) SARS-CoV-2 target nucleic acids are NOT DETECTED.  The SARS-CoV-2 RNA is generally detectable in upper and lower respiratory specimens during the acute phase of infection. The lowest concentration of SARS-CoV-2 viral copies this assay can detect is 250 copies / mL. A negative result does not preclude SARS-CoV-2 infection and should not be used as the sole basis for treatment or other patient management decisions.  A negative result may occur with improper specimen collection / handling, submission of specimen other than nasopharyngeal swab, presence of viral mutation(s) within the areas targeted by this assay, and inadequate number of viral copies (<250 copies / mL). A negative result must be combined with clinical observations, patient history, and epidemiological information.  Fact Sheet for Patients:   StrictlyIdeas.no  Fact Sheet for Healthcare Providers: BankingDealers.co.za  This test is not yet approved or  cleared by the [place] FDA and has been authorized for detection and/or diagnosis of SARS-CoV-2 by FDA under an Emergency Use Authorization (EUA).  This EUA will remain in effect (meaning this test can be used) for the duration of the COVID-19 declaration under Section 564(b)(1) of the Act, 21 U.S.C. section 360bbb-3(b)(1), unless the authorization is terminated or revoked sooner.  Performed at [facility], [place] [address] [place], [place] [address]       Radiology Studies: No results found.   LOS:  6 days   Bonnielee Haff, MD [facility]  01/31/2020, 11:38 AM

## 2020-01-31 NOTE — Progress Notes (Signed)
   Daily Progress Note   Patient Name: [name]       Date: 01/31/2020 DOB: [birth year]  Age: 69 y.o. MRN#: [id number] Attending Physician: Bonnielee Haff, MD Primary Care Physician: Wenda Low, MD Admit Date: 01/25/2020  Reason for Consultation/Follow-up: Establishing goals of care and Terminal Care  Subjective: Patient unresponsive to sternal rub. She appears comfortable without s/s of pain or dyspnea. Required prn morphine and ativan on night shift, which has been successful in managing her symptoms. Shallow, regular respiratory pattern.   No family at bedside. VM left for daughter, [name]. PMT contact information given and reassured of ongoing PMT provider support.   Discussed with RN.   Length of Stay: 6 days  Vital Signs: BP (!) 142/64 (BP Location: Left Arm)   Pulse (!) 45   Temp (!) 97.2 F (36.2 C) (Axillary) Comment: RN Notified warm blankets applied  Resp 14   Ht 5\' 4"  (1.626 m)   Wt 54.8 kg   SpO2 90%   BMI 20.74 kg/m  SpO2: SpO2: 90 % O2 Device: O2 Device: Room Air O2 Flow Rate: O2 Flow Rate (L/min): 2 L/min  Physical Exam: -unresponsive to sternal rub but appears comfortable. No s/s of pain/dyspnea/facial grimacing. -shallow, regular respirations -heart sounds normal -skin warm, dry, scattered ecchymosis, no mottling  Palliative Care Assessment & Plan    Code Status:  DNR  Goals of Care/Recommendations:  Continue comfort measures only.  Continue prn symptom management medications and scheduled anti-epileptics to ensure comfort as she nears EOL.   Anticipated hospital death. Allow unrestricted visitor access.   PMT provider will continue to follow for symptom needs and support to family.    Prognosis: Hours - Days  Discharge Planning: Anticipated Hospital Death  Thank you for allowing the Palliative Medicine Team to assist in the care of this patient.  Time Total: 85min  Greater than 50% of this time was spent counseling and  coordinating care related to the above assessment and plan.   Ihor Dow, DNP, FNP-C Palliative Medicine Team  Phone: [phone] Fax: [phone]

## 2020-02-01 DIAGNOSIS — E876 Hypokalemia: Secondary | ICD-10-CM

## 2020-02-01 NOTE — Progress Notes (Signed)
PROGRESS NOTE  [name]:[id number] DOB: 1951-02-06 DOA: 01/25/2020 PCP: Wenda Low, MD   Brief Narrative: 69 y.o. female with medical history significant of alcoholic cirrhosis with ascites, hx of etoh abuse, tobacco abuse, hx lung cancer, tongue tumor, HTN presents to ED from SNF for increased confusion, refusing meds and increased jaundice, edema. She was discharged recently on 7/5, admitted 01/04/20:following admission for treatment of ETOH hepatitis with ascites, encephalopathy/?wernicke's- felt unsafe for home due to confusion and sent to SNF. prior to last admit heavy etoh use 2 bottles wine/daily, living by herself. Since discharge to SNF,pt has been refusing meds and food per daughter and noted to be more edematous after not taking meds.  She was hospitalized due to concern for seizure activity.   Subjective: Patient not responsive.  Friends and family at bedside.  Assessment & Plan:  Acute encephalopathy/Hepatic encephalopathy in the setting of cirrhosis/UTI During previous hospitalization there was concern for Wernicke's encephalopathy.  Due to safety concern she was sent to skilled nursing facility.  Prior to previous admission she did have a history of significant alcohol use.   MRI brain 7/18 limited by motion but did show evidence of seizure edema on the left hemisphere with clinical correlate with right-sided twitching.  Neurology urgently evaluated the patient 7/18- and discussed with the daughter and the niece-option was given regarding transfer to [facility] and monitoring on continuous EEG but given that patient is DO NOT RESUSCITATE DO NOT INTUBATE as per her wishes, family declined transfer and wanted to see how she does and patient was given Keppra, Dilantin and EEG was ordered for the morning. EEG showed evidence of epileptogenic discharge in left hemisphere, maximal left posterior quadrant, periodic epileptiform discharges have overlying fast activity at times are  rhythmic which is on the ictal-interictal continuum with a high potential for seizures, additionally with evidence of severe diffuse encephalopathy nonspecific etiology.   Despite adding Vimpat to Dilantin and Keppra patient did not improve. Case was discussed with neurology.  Recommendation for was either to intubate or do a burst suppression versus comfort measures.   Palliative care was following.  Discussions were held with family.  Subsequently she was transitioned to comfort care.   Comfort measures in place.  Patient is poorly responsive.  Anticipate in-hospital death.  Dilantin was discontinued yesterday.  We will stop her Keppra today.  Continue as needed Ativan for seizure type activity.    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  Did not improve despite multiple AEDs.  Now comfort measures.  Acute alcoholic hepatitis/alcoholic liver disease with Hypoalbuminemia Patient with liver failure and coagulopathy with worsening INR.  Also noted to have elevated ammonia level.  Has been on lactulose.   CT abdomen showed hepatic cirrhosis. Diuretics-lasix/aldactone were on hold due to AKI. Last admission she had ultrasound-guided paracentesis yielding 2.4 L fluid.   MELD score 23  Hypotension Likely multifactorial from volume depletion, hypoalbuminemia, from hypoalbuminemia in the setting of liver cirrhosis, antiseizure medication.    Acute hypoxic respiratory failure In the setting of encephalopathy, also layering pleural effusion atelectasis from associated hypoalbuminemia/third spacing.  AKI Likely multifactorial etiology.  Acute lower UTI POA S/p 4 doses of ceftriaxone.  Cultures have been negative.  Antibiotics were discontinued. Mild lactic acidosis had resolved.   COPD Chest x-ray unremarkable.  Thrombocytopenia Platelet count is low but stable.  Likely due to her liver disease.  Low-grade bronchogenic carcinoma  noted in the CT scan Also  history of mouth tumor.  Patient apparently did not want to pursue further management of these suspected malignancies.   Macrocytic anemia Suspect from alcohol abuse/from chronic liver disease.  Fecal impaction/constipation Was manually disimpacted and also received Dulcolax suppository 7/17.  Also on lactulose enema  Essential Tremors hx As per daughter she is on pimodine for same. She has tremors on both of her hand and at times voice gets shaky from her tremors as per daughter.  Moderate protein calorie malnutrition   GOC DNR.  Comfort measures.  DVT prophylaxis: Comfort measures Code Status: DNR Family Communication: Discussed with family.  Status is: Inpatient  Remains inpatient appropriate because:Inpatient level of care appropriate due to severity of illness   Dispo:  Patient From: Home  Planned Disposition: Anticipate in-hospital death  Expected discharge date: 01/31/20  Medically stable for discharge: No    Diet Order            Diet NPO time specified Except for: Other (See Comments)  Diet effective now                 Body mass index is 20.74 kg/m. Pressure Ulcer: skin team in rt arm.  Consultants: Palliative care.  Neurology.   Microbiology: Blood Culture    Component Value Date/Time   SDES  01/25/2020 1108    BLOOD LEFT ANTECUBITAL Performed at [facility], [place] [address], [place], [place] [address]    SDES  01/25/2020 1108    URINE, CLEAN CATCH Performed at [facility], [place] [address], [place], [place] [address]    SPECREQUEST  01/25/2020 1108    BOTTLES DRAWN AEROBIC AND ANAEROBIC Blood Culture adequate volume Performed at [place] [address] [place], [place] [address]    SPECREQUEST  01/25/2020 1108    NONE Performed at [facility], [place] [address] [place], [place] [address]    CULT  01/25/2020 1108    NO GROWTH 5  DAYS Performed at [facility], [place] [address], [place], [place] [address]    CULT  01/25/2020 1108    NO GROWTH Performed at [facility], [place] [address], [place], [place] [address]    REPTSTATUS 01/30/2020 FINAL 01/25/2020 1108   REPTSTATUS 01/26/2020 FINAL 01/25/2020 1108    Other culture-see note  Medications: Scheduled Meds: . LORazepam  1 mg Intravenous Q12H   Continuous Infusions: . sodium chloride 10 mL/hr at 01/29/20 1537  . levETIRAcetam 750 mg (02/01/20 1148)    Antimicrobials: Anti-infectives (From admission, onward)   Start     Dose/Rate Route Frequency Ordered Stop   01/26/20 1230  rifaximin (XIFAXAN) tablet 550 mg  Status:  Discontinued        550 mg Oral 2 times daily 01/26/20 1143 01/29/20 1501   01/26/20 1000  cefTRIAXone (ROCEPHIN) 2 g in sodium chloride 0.9 % 100 mL IVPB  Status:  Discontinued        2 g 200 mL/hr over 30 Minutes Intravenous Every 24 hours 01/25/20 2333 01/28/20 1639   01/25/20 1200  cefTRIAXone (ROCEPHIN) 1 g in sodium chloride 0.9 % 100 mL IVPB        1 g 200 mL/hr over 30 Minutes Intravenous  Once 01/25/20 1151 01/25/20 1234       Objective: Vitals: Today's Vitals   01/30/20 0900 01/30/20 1000 01/30/20 1100 01/31/20 0745  BP:      Pulse: (!) 47 (!) 45    Resp:  13 14 14    Temp:      TempSrc:      SpO2: 100% 100% 90%   Weight:      Height:      PainSc:  Asleep  Asleep   No intake or output data in the 24 hours ending 02/01/20 1237 Filed Weights   01/25/20 1059 01/28/20 1822  Weight: 52 kg 54.8 kg   Weight change:    Intake/Output from previous day: No intake/output data recorded. Intake/Output this shift: No intake/output data recorded.  Examination:  Patient not responsive.  Shallow respirations noted.   Data Reviewed: I have personally reviewed following labs and imaging studies CBC: Recent Labs  Lab 01/26/20 0621 01/27/20 1615 01/28/20 0625 01/29/20 0307  WBC 12.5* 11.1* 10.7* 10.2  HGB  10.8* 7.7* 7.8* 7.0*  HCT 32.1* 23.0* 24.7* 22.3*  MCV 101.6* 104.1* 108.3* 110.4*  PLT 100* 135* 134* 671*   Basic Metabolic Panel: Recent Labs  Lab 01/26/20 0621 01/27/20 0700 01/28/20 0625 01/29/20 0307  NA 136 140 143 143  K 3.2* 3.5 3.6 3.3*  CL 94* 99 104 107  CO2 29 26 25 22   GLUCOSE 81 99 124* 111*  BUN 23 26* 27* 28*  CREATININE 1.20* 1.65* 1.67* 1.61*  CALCIUM 7.3* 7.9* 7.6* 7.6*  MG  --  1.4* 2.7*  --   PHOS  --  2.7  --   --    GFR: Estimated Creatinine Clearance: 28.1 mL/min (A) (by C-G formula based on SCr of 1.61 mg/dL (H)). Liver Function Tests: Recent Labs  Lab 01/26/20 0621 01/27/20 0700 01/28/20 0625 01/29/20 0307  AST 72* 51* 48* 42*  ALT 38 30 27 25   ALKPHOS 124 107 94 87  BILITOT 5.7* 4.7* 3.9* 3.1*  PROT 5.3* 5.5* 5.0* 5.2*  ALBUMIN 2.3* 3.2* 3.0* 3.3*   No results for input(s): LIPASE, AMYLASE in the last 168 hours. Recent Labs  Lab 01/27/20 1615 01/28/20 0625 01/29/20 0307  AMMONIA 142* 16 62*   Coagulation Profile: Recent Labs  Lab 01/27/20 0700 01/28/20 0625 01/29/20 0307  INR 1.8* 1.8* 2.0*   Sepsis Labs: Recent Labs  Lab 01/25/20 1520  LATICACIDVEN 1.9    Recent Results (from the past 240 hour(s))  Culture, blood (routine x 2)     Status: None   Collection Time: 01/25/20 11:08 AM   Specimen: BLOOD  Result Value Ref Range Status   Specimen Description   Final    BLOOD LEFT ANTECUBITAL Performed at [facility], [place] [address], [place], [place] [address]    Special Requests   Final    BOTTLES DRAWN AEROBIC AND ANAEROBIC Blood Culture adequate volume Performed at [place] [address], [place], [place] [address]    Culture   Final    NO GROWTH 5 DAYS Performed at [facility], [place] [address] [place], [place] [address]    Report Status 01/30/2020 FINAL  Final  Urine culture     Status: None   Collection Time: 01/25/20 11:08 AM   Specimen: Urine, Clean Catch    Result Value Ref Range Status   Specimen Description   Final    URINE, CLEAN CATCH Performed at [facility], [place] [address], [place], [place] [address]    Special Requests   Final    NONE Performed at [facility], [place] [address] [place], [place] [address]    Culture   Final    NO  GROWTH Performed at [facility], [place] [address] [place], [place] [address]    Report Status 01/26/2020 FINAL  Final  SARS Coronavirus 2 by RT PCR (hospital order, performed in [facility] hospital lab) Nasopharyngeal Nasopharyngeal Swab     Status: None   Collection Time: 01/25/20 12:05 PM   Specimen: Nasopharyngeal Swab  Result Value Ref Range Status   SARS Coronavirus 2 NEGATIVE NEGATIVE Final    Comment: (NOTE) SARS-CoV-2 target nucleic acids are NOT DETECTED.  The SARS-CoV-2 RNA is generally detectable in upper and lower respiratory specimens during the acute phase of infection. The lowest concentration of SARS-CoV-2 viral copies this assay can detect is 250 copies / mL. A negative result does not preclude SARS-CoV-2 infection and should not be used as the sole basis for treatment or other patient management decisions.  A negative result may occur with improper specimen collection / handling, submission of specimen other than nasopharyngeal swab, presence of viral mutation(s) within the areas targeted by this assay, and inadequate number of viral copies (<250 copies / mL). A negative result must be combined with clinical observations, patient history, and epidemiological information.  Fact Sheet for Patients:   StrictlyIdeas.no  Fact Sheet for Healthcare Providers: BankingDealers.co.za  This test is not yet approved or  cleared by the [place] FDA and has been authorized for detection and/or diagnosis of SARS-CoV-2 by FDA under an Emergency Use Authorization (EUA).  This EUA will remain in  effect (meaning this test can be used) for the duration of the COVID-19 declaration under Section 564(b)(1) of the Act, 21 U.S.C. section 360bbb-3(b)(1), unless the authorization is terminated or revoked sooner.  Performed at [facility], [place] [address], [place], [place] [address]       Radiology Studies: No results found.   LOS: 7 days   Bonnielee Haff, MD [facility]  02/01/2020, 12:37 PM

## 2020-02-01 NOTE — Progress Notes (Signed)
   Daily Progress Note   Patient Name: [name]       Date: 02/01/2020 DOB: 12-Oct-1950  Age: 69 y.o. MRN#: [id number] Attending Physician: Bonnielee Haff, MD Primary Care Physician: Wenda Low, MD Admit Date: 01/25/2020  Reason for Consultation/Follow-up: Establishing goals of care and Terminal Care  Subjective: Patient unresponsive to sternal rub. She appears slightly uncomfortable with accessory muscle use and gasping on expiration. Has not received prn morphine in >24 hours. Regular respiratory pattern.   Discussed with RN who will give prn morphine for dyspnea/air hunger.   No family at bedside. Daughter, [name] has PMT contact information.   Length of Stay: 7 days  Vital Signs: BP (!) 142/64 (BP Location: Left Arm)   Pulse (!) 45   Temp (!) 97.2 F (36.2 C) (Axillary) Comment: RN Notified warm blankets applied  Resp 14   Ht 5\' 4"  (1.626 m)   Wt 54.8 kg   SpO2 90%   BMI 20.74 kg/m  SpO2: SpO2: 90 % O2 Device: O2 Device: Room Air O2 Flow Rate: O2 Flow Rate (L/min): 2 L/min  Physical Exam: -unresponsive to sternal rub, slightly uncomfortable -shallow, regular respirations. Accessory muscle usage -heart sounds normal -skin warm, dry, scattered ecchymosis, no mottling  Palliative Care Assessment & Plan    Code Status:  DNR  Goals of Care/Recommendations:  Continue comfort measures only.  Continue prn symptom management medications and scheduled anti-epileptics to ensure comfort as she nears EOL. Consider scheduled morphine if requiring frequent prn.   Unrestricted visitor access as patient is nearing EOL.   PMT provider will continue to follow for symptom needs and support to family.    Prognosis: Hours - Days  Discharge Planning: Anticipated Hospital Death  Thank you for allowing the Palliative Medicine Team to assist in the care of this patient.  Time Total: 75min  Greater than 50% of this time was spent counseling and coordinating care related  to the above assessment and plan.   Ihor Dow, DNP, FNP-C Palliative Medicine Team  Phone: [phone] Fax: [phone]

## 2020-02-02 NOTE — Progress Notes (Signed)
   Daily Progress Note   Patient Name: [name]       Date: 02/02/2020 DOB: 1951-07-07  Age: 69 y.o. MRN#: [id number] Attending Physician: Bonnielee Haff, MD Primary Care Physician: Wenda Low, MD Admit Date: 01/25/2020  Reason for Consultation/Follow-up: Establishing goals of care and Terminal Care  Subjective: Patient unresponsive to sternal rub. She appears to have shallow respirations, no apneic spells detected. No family at bedside.   Discussed with bedside RN.     Length of Stay: 8 days  Vital Signs: BP (!) 142/64 (BP Location: Left Arm)   Pulse (!) 45   Temp (!) 97.2 F (36.2 C) (Axillary) Comment: RN Notified warm blankets applied  Resp 14   Ht 5\' 4"  (1.626 m)   Wt 54.8 kg   SpO2 90%   BMI 20.74 kg/m  SpO2: SpO2: 90 % O2 Device: O2 Device: Room Air O2 Flow Rate: O2 Flow Rate (L/min): 2 L/min  Physical Exam: -unresponsive to sternal rub, has petechiae -shallow, regular respirations. Accessory muscle usage -heart sounds normal -skin warm, dry, scattered ecchymosis, no mottling  Palliative Care Assessment & Plan    Code Status:  DNR  Goals of Care/Recommendations:  Continue comfort measures only.  Continue prn symptom management medications and scheduled anti-epileptics to ensure comfort as she nears EOL. Consider scheduled morphine if requiring frequent prn.   Unrestricted visitor access as patient is nearing EOL.   PMT provider will continue to follow for symptom needs and support to family.    Prognosis: Hours - Days  Discharge Planning: Anticipated Hospital Death  Thank you for allowing the Palliative Medicine Team to assist in the care of this patient.  Time Total: 94min  Greater than 50% of this time was spent counseling and coordinating care related to the above assessment and plan.   Loistine Chance MD Palliative Medicine Team  Phone: [phone] Fax: [phone]

## 2020-02-02 NOTE — Progress Notes (Signed)
PROGRESS NOTE  [name] YDX:[id number] DOB: 30-Jul-1950 DOA: 01/25/2020 PCP: Wenda Low, MD   Brief Narrative: 69 y.o. female with medical history significant of alcoholic cirrhosis with ascites, hx of etoh abuse, tobacco abuse, hx lung cancer, tongue tumor, HTN presents to ED from SNF for increased confusion, refusing meds and increased jaundice, edema. She was discharged recently on 7/5, admitted 01/04/20:following admission for treatment of ETOH hepatitis with ascites, encephalopathy/?wernicke's- felt unsafe for home due to confusion and sent to SNF. prior to last admit heavy etoh use 2 bottles wine/daily, living by herself. Since discharge to SNF,pt has been refusing meds and food per daughter and noted to be more edematous after not taking meds.  She was hospitalized due to concern for seizure activity.   Subjective: Patient not responsive.  No family at bedside today.  Assessment & Plan:  Acute encephalopathy/Hepatic encephalopathy in the setting of cirrhosis/UTI During previous hospitalization there was concern for Wernicke's encephalopathy.  Due to safety concern she was sent to skilled nursing facility.  Prior to previous admission she did have a history of significant alcohol use.   MRI brain 7/18 limited by motion but did show evidence of seizure edema on the left hemisphere with clinical correlate with right-sided twitching.  Neurology urgently evaluated the patient 7/18- and discussed with the daughter and the niece-option was given regarding transfer to [facility] and monitoring on continuous EEG but given that patient is DO NOT RESUSCITATE DO NOT INTUBATE as per her wishes, family declined transfer and wanted to see how she does and patient was given Keppra, Dilantin and EEG was ordered for the morning. EEG showed evidence of epileptogenic discharge in left hemisphere, maximal left posterior quadrant, periodic epileptiform discharges have overlying fast activity at times are  rhythmic which is on the ictal-interictal continuum with a high potential for seizures, additionally with evidence of severe diffuse encephalopathy nonspecific etiology.  Despite adding Vimpat to Dilantin and Keppra patient did not improve. Case was discussed with neurology.  Recommendation for was either to intubate or do a burst suppression versus comfort measures.   Palliative care was following.  Discussions were held with family.  Subsequently she was transitioned to comfort care.   Comfort measures in place.  Patient is poorly responsive.  Anticipate in-hospital death.   Antiepileptic drugs were discontinued.  Continue as needed Ativan for seizure type activity.    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  Did not improve despite multiple AEDs.  Now comfort measures.  Acute alcoholic hepatitis/alcoholic liver disease with Hypoalbuminemia Patient with liver failure and coagulopathy with worsening INR.  Also noted to have elevated ammonia level.  Has been on lactulose.   CT abdomen showed hepatic cirrhosis. Diuretics-lasix/aldactone were on hold due to AKI. Last admission she had ultrasound-guided paracentesis yielding 2.4 L fluid.   MELD score 23  Hypotension Likely multifactorial from volume depletion, hypoalbuminemia, from hypoalbuminemia in the setting of liver cirrhosis, antiseizure medication.    Acute hypoxic respiratory failure In the setting of encephalopathy, also layering pleural effusion atelectasis from associated hypoalbuminemia/third spacing.  AKI Likely multifactorial etiology.  Acute lower UTI POA S/p 4 doses of ceftriaxone.  Cultures have been negative.  Antibiotics were discontinued. Mild lactic acidosis had resolved.   COPD Chest x-ray unremarkable.  Thrombocytopenia Platelet count is low but stable.  Likely due to her liver disease.  Low-grade bronchogenic carcinoma noted in the CT scan Also  history of mouth tumor.  Patient apparently  did not want to pursue further management of these suspected malignancies.   Macrocytic anemia Suspect from alcohol abuse/from chronic liver disease.  Fecal impaction/constipation Was manually disimpacted and also received Dulcolax suppository 7/17.  Also on lactulose enema  Essential Tremors hx As per daughter she is on pimodine for same. She has tremors on both of her hand and at times voice gets shaky from her tremors as per daughter.  Moderate protein calorie malnutrition   GOC DNR.  Comfort measures.  DVT prophylaxis: Comfort measures Code Status: DNR Family Communication: Discussed with family.  Anticipate in-hospital death.   Diet Order            Diet NPO time specified Except for: Other (See Comments)  Diet effective now                 Body mass index is 20.74 kg/m.   Consultants: Palliative care.  Neurology.   Microbiology: Blood Culture    Component Value Date/Time   SDES  01/25/2020 1108    BLOOD LEFT ANTECUBITAL Performed at [facility], [place] [address] [place], [place] [address]    SDES  01/25/2020 1108    URINE, CLEAN CATCH Performed at [facility], [place] [address] [place], [place] [address]    SPECREQUEST  01/25/2020 1108    BOTTLES DRAWN AEROBIC AND ANAEROBIC Blood Culture adequate volume Performed at [place] [address], [place], [place] [address]    SPECREQUEST  01/25/2020 1108    NONE Performed at [facility], [place] [address], [place], [place] [address]    CULT  01/25/2020 1108    NO GROWTH 5 DAYS Performed at [facility], [place] [address] [place], [place] [address]    CULT  01/25/2020 1108    NO GROWTH Performed at [facility], [place] [address], [place], [place] [address]    REPTSTATUS 01/30/2020 FINAL 01/25/2020 1108   REPTSTATUS 01/26/2020 FINAL 01/25/2020 1108    Other culture-see  note  Medications: Scheduled Meds: . LORazepam  1 mg Intravenous Q12H   Continuous Infusions: . sodium chloride 10 mL/hr at 01/29/20 1537    Antimicrobials: Anti-infectives (From admission, onward)   Start     Dose/Rate Route Frequency Ordered Stop   01/26/20 1230  rifaximin (XIFAXAN) tablet 550 mg  Status:  Discontinued        550 mg Oral 2 times daily 01/26/20 1143 01/29/20 1501   01/26/20 1000  cefTRIAXone (ROCEPHIN) 2 g in sodium chloride 0.9 % 100 mL IVPB  Status:  Discontinued        2 g 200 mL/hr over 30 Minutes Intravenous Every 24 hours 01/25/20 2333 01/28/20 1639   01/25/20 1200  cefTRIAXone (ROCEPHIN) 1 g in sodium chloride 0.9 % 100 mL IVPB        1 g 200 mL/hr over 30 Minutes Intravenous  Once 01/25/20 1151 01/25/20 1234       Objective: Vitals: Today's Vitals   01/30/20 0900 01/30/20 1000 01/30/20 1100 01/31/20 0745  BP:      Pulse: (!) 47 (!) 45    Resp: 13 14 14    Temp:      TempSrc:      SpO2: 100% 100% 90%   Weight:      Height:      PainSc:  Asleep  Asleep   No intake or output data in the 24 hours ending 02/02/20 1323 Filed Weights   01/25/20  1059 01/28/20 1822  Weight: 52 kg 54.8 kg   Weight change:    Intake/Output from previous day: No intake/output data recorded. Intake/Output this shift: No intake/output data recorded.  Examination:  Patient remains unresponsive.  Shallow respirations noted.   Data Reviewed: I have personally reviewed following labs and imaging studies CBC: Recent Labs  Lab 01/27/20 1615 01/28/20 0625 01/29/20 0307  WBC 11.1* 10.7* 10.2  HGB 7.7* 7.8* 7.0*  HCT 23.0* 24.7* 22.3*  MCV 104.1* 108.3* 110.4*  PLT 135* 134* 786*   Basic Metabolic Panel: Recent Labs  Lab 01/27/20 0700 01/28/20 0625 01/29/20 0307  NA 140 143 143  K 3.5 3.6 3.3*  CL 99 104 107  CO2 26 25 22   GLUCOSE 99 124* 111*  BUN 26* 27* 28*  CREATININE 1.65* 1.67* 1.61*  CALCIUM 7.9* 7.6* 7.6*  MG 1.4* 2.7*  --   PHOS 2.7  --    --    GFR: Estimated Creatinine Clearance: 28.1 mL/min (A) (by C-G formula based on SCr of 1.61 mg/dL (H)). Liver Function Tests: Recent Labs  Lab 01/27/20 0700 01/28/20 0625 01/29/20 0307  AST 51* 48* 42*  ALT 30 27 25   ALKPHOS 107 94 87  BILITOT 4.7* 3.9* 3.1*  PROT 5.5* 5.0* 5.2*  ALBUMIN 3.2* 3.0* 3.3*    Recent Labs  Lab 01/27/20 1615 01/28/20 0625 01/29/20 0307  AMMONIA 142* 16 62*   Coagulation Profile: Recent Labs  Lab 01/27/20 0700 01/28/20 0625 01/29/20 0307  INR 1.8* 1.8* 2.0*     Recent Results (from the past 240 hour(s))  Culture, blood (routine x 2)     Status: None   Collection Time: 01/25/20 11:08 AM   Specimen: BLOOD  Result Value Ref Range Status   Specimen Description   Final    BLOOD LEFT ANTECUBITAL Performed at [facility], [place] [address] [place], [place] [address]    Special Requests   Final    BOTTLES DRAWN AEROBIC AND ANAEROBIC Blood Culture adequate volume Performed at [place] [address] [place], [place] [address]    Culture   Final    NO GROWTH 5 DAYS Performed at [facility], [place] [address] [place], [place] [address]    Report Status 01/30/2020 FINAL  Final  Urine culture     Status: None   Collection Time: 01/25/20 11:08 AM   Specimen: Urine, Clean Catch  Result Value Ref Range Status   Specimen Description   Final    URINE, CLEAN CATCH Performed at [facility], [place] [address], [place], [place] [address]    Special Requests   Final    NONE Performed at [facility], [place] [address], [place], [place] [address]    Culture   Final    NO GROWTH Performed at [facility], [place] [address], [place], [place] [address]    Report Status 01/26/2020 FINAL  Final  SARS Coronavirus 2 by RT PCR (hospital order, performed in [facility] hospital lab) Nasopharyngeal Nasopharyngeal Swab     Status: None   Collection Time: 01/25/20  12:05 PM   Specimen: Nasopharyngeal Swab  Result Value Ref Range Status   SARS Coronavirus 2 NEGATIVE NEGATIVE Final    Comment: (NOTE) SARS-CoV-2 target nucleic acids are NOT DETECTED.  The SARS-CoV-2 RNA is generally detectable in upper and lower respiratory specimens during the acute phase of infection. The lowest concentration of SARS-CoV-2 viral copies this assay  can detect is 250 copies / mL. A negative result does not preclude SARS-CoV-2 infection and should not be used as the sole basis for treatment or other patient management decisions.  A negative result may occur with improper specimen collection / handling, submission of specimen other than nasopharyngeal swab, presence of viral mutation(s) within the areas targeted by this assay, and inadequate number of viral copies (<250 copies / mL). A negative result must be combined with clinical observations, patient history, and epidemiological information.  Fact Sheet for Patients:   StrictlyIdeas.no  Fact Sheet for Healthcare Providers: BankingDealers.co.za  This test is not yet approved or  cleared by the [place] FDA and has been authorized for detection and/or diagnosis of SARS-CoV-2 by FDA under an Emergency Use Authorization (EUA).  This EUA will remain in effect (meaning this test can be used) for the duration of the COVID-19 declaration under Section 564(b)(1) of the Act, 21 U.S.C. section 360bbb-3(b)(1), unless the authorization is terminated or revoked sooner.  Performed at [facility], [place] [address] [place], [place] [address]       Radiology Studies: No results found.   LOS: 8 days   Bonnielee Haff, MD [facility]  02/02/2020, 1:23 PM

## 2020-02-03 DIAGNOSIS — Z515 Encounter for palliative care: Secondary | ICD-10-CM

## 2020-02-03 DIAGNOSIS — Z7189 Other specified counseling: Secondary | ICD-10-CM

## 2020-02-03 NOTE — Progress Notes (Signed)
PROGRESS NOTE  [name]:[id number] DOB: [birth year] DOA: 01/25/2020 PCP: Wenda Low, MD   Brief Narrative: 69 y.o. female with medical history significant of alcoholic cirrhosis with ascites, hx of etoh abuse, tobacco abuse, hx lung cancer, tongue tumor, HTN presents to ED from SNF for increased confusion, refusing meds and increased jaundice, edema. She was discharged recently on 7/5, admitted 01/04/20:following admission for treatment of ETOH hepatitis with ascites, encephalopathy/?wernicke's- felt unsafe for home due to confusion and sent to SNF. prior to last admit heavy etoh use 2 bottles wine/daily, living by herself. Since discharge to SNF,pt has been refusing meds and food per daughter and noted to be more edematous after not taking meds.  She was hospitalized due to concern for seizure activity.   Subjective: Remains unresponsive as before.  Assessment & Plan:  Acute encephalopathy/Hepatic encephalopathy in the setting of cirrhosis/UTI During previous hospitalization there was concern for Wernicke's encephalopathy.  Due to safety concern she was sent to skilled nursing facility.  Prior to previous admission she did have a history of significant alcohol use.   MRI brain 7/18 limited by motion but did show evidence of seizure edema on the left hemisphere with clinical correlate with right-sided twitching.  Neurology urgently evaluated the patient 7/18- and discussed with the daughter and the niece-option was given regarding transfer to [facility] and monitoring on continuous EEG but given that patient is DO NOT RESUSCITATE DO NOT INTUBATE as per her wishes, family declined transfer and wanted to see how she does and patient was given Keppra, Dilantin and EEG was ordered for the morning. EEG showed evidence of epileptogenic discharge in left hemisphere, maximal left posterior quadrant, periodic epileptiform discharges have overlying fast activity at times are rhythmic which is on  the ictal-interictal continuum with a high potential for seizures, additionally with evidence of severe diffuse encephalopathy nonspecific etiology.  Despite adding Vimpat to Dilantin and Keppra patient did not improve. Case was discussed with neurology.  Recommendation for was either to intubate or do a burst suppression versus comfort measures.   Palliative care was following.  Discussions were held with family.  Subsequently she was transitioned to comfort care.   Antiepileptic drugs were discontinued.  Continue as needed Ativan for seizure type activity.   Have been anticipating in-hospital death.  However patient has been stable for the last 48 hours.  Discussed with Dr. Rowe Pavy with palliative care.  We will see how she does over the next 24 hours.  If she remains stable then may have to consider residential hospice.  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  Did not improve despite multiple AEDs.  Now comfort measures.  Acute alcoholic hepatitis/alcoholic liver disease with Hypoalbuminemia Patient with liver failure and coagulopathy with worsening INR.  Also noted to have elevated ammonia level.  Has been on lactulose.   CT abdomen showed hepatic cirrhosis. Diuretics-lasix/aldactone were on hold due to AKI. Last admission she had ultrasound-guided paracentesis yielding 2.4 L fluid.   MELD score 23  Hypotension Likely multifactorial from volume depletion, hypoalbuminemia, from hypoalbuminemia in the setting of liver cirrhosis, antiseizure medication.    Acute hypoxic respiratory failure In the setting of encephalopathy, also layering pleural effusion atelectasis from associated hypoalbuminemia/third spacing.  AKI Likely multifactorial etiology.  Acute lower UTI POA S/p 4 doses of ceftriaxone.  Cultures have been negative.  Antibiotics were discontinued. Mild lactic acidosis had resolved.   COPD Chest x-ray  unremarkable.  Thrombocytopenia Platelet count  is low but stable.  Likely due to her liver disease.  Low-grade bronchogenic carcinoma noted in the CT scan Also history of mouth tumor.  Patient apparently did not want to pursue further management of these suspected malignancies.   Macrocytic anemia Suspect from alcohol abuse/from chronic liver disease.  Fecal impaction/constipation Was manually disimpacted and also received Dulcolax suppository 7/17.  Also on lactulose enema  Essential Tremors hx As per daughter she is on pimodine for same. She has tremors on both of her hand and at times voice gets shaky from her tremors as per daughter.  Moderate protein calorie malnutrition   GOC DNR.  Comfort measures.  DVT prophylaxis: Comfort measures Code Status: DNR Family Communication: No family at bedside.    Diet Order            Diet NPO time specified Except for: Other (See Comments)  Diet effective now                 Body mass index is 20.74 kg/m.   Consultants: Palliative care.  Neurology.   Microbiology: Blood Culture    Component Value Date/Time   SDES  01/25/2020 1108    BLOOD LEFT ANTECUBITAL Performed at [facility], [place] [address] [place], [place] [address]    SDES  01/25/2020 1108    URINE, CLEAN CATCH Performed at [facility], [place] [address] [place], [place] [address]    SPECREQUEST  01/25/2020 1108    BOTTLES DRAWN AEROBIC AND ANAEROBIC Blood Culture adequate volume Performed at [place] [address], [place], [place] [address]    SPECREQUEST  01/25/2020 1108    NONE Performed at [facility], [place] [address], [place], [place] [address]    CULT  01/25/2020 1108    NO GROWTH 5 DAYS Performed at [facility], [place] [address] [place], [place] [address]    CULT  01/25/2020 1108    NO GROWTH Performed at [facility], [place] [address]  [place], [place] [address]    REPTSTATUS 01/30/2020 FINAL 01/25/2020 1108   REPTSTATUS 01/26/2020 FINAL 01/25/2020 1108    Other culture-see note  Medications: Scheduled Meds: . LORazepam  1 mg Intravenous Q12H   Continuous Infusions: . sodium chloride 10 mL/hr at 01/29/20 1537    Antimicrobials: Anti-infectives (From admission, onward)   Start     Dose/Rate Route Frequency Ordered Stop   01/26/20 1230  rifaximin (XIFAXAN) tablet 550 mg  Status:  Discontinued        550 mg Oral 2 times daily 01/26/20 1143 01/29/20 1501   01/26/20 1000  cefTRIAXone (ROCEPHIN) 2 g in sodium chloride 0.9 % 100 mL IVPB  Status:  Discontinued        2 g 200 mL/hr over 30 Minutes Intravenous Every 24 hours 01/25/20 2333 01/28/20 1639   01/25/20 1200  cefTRIAXone (ROCEPHIN) 1 g in sodium chloride 0.9 % 100 mL IVPB        1 g 200 mL/hr over 30 Minutes Intravenous  Once 01/25/20 1151 01/25/20 1234       Objective: Vitals: Today's Vitals   01/30/20 0900 01/30/20 1000 01/30/20 1100 01/31/20 0745  BP:      Pulse: (!) 47 (!) 45    Resp: 13 14 14    Temp:      TempSrc:      SpO2: 100% 100% 90%   Weight:      Height:  PainSc:  Asleep  Asleep    Intake/Output Summary (Last 24 hours) at 02/03/2020 1204 Last data filed at 02/03/2020 0400 Gross per 24 hour  Intake 0 ml  Output --  Net 0 ml   Filed Weights   01/25/20 1059 01/28/20 1822  Weight: 52 kg 54.8 kg   Weight change:    Intake/Output from previous day: No intake/output data recorded. Intake/Output this shift: No intake/output data recorded.  Examination:  Patient remains unresponsive. S1-S2 normal regular.  Shallow respirations noted.   Data Reviewed: I have personally reviewed following labs and imaging studies CBC: Recent Labs  Lab 01/27/20 1615 01/28/20 0625 01/29/20 0307  WBC 11.1* 10.7* 10.2  HGB 7.7* 7.8* 7.0*  HCT 23.0* 24.7* 22.3*  MCV 104.1* 108.3* 110.4*  PLT 135* 134* 786*   Basic Metabolic Panel: Recent  Labs  Lab 01/28/20 0625 01/29/20 0307  NA 143 143  K 3.6 3.3*  CL 104 107  CO2 25 22  GLUCOSE 124* 111*  BUN 27* 28*  CREATININE 1.67* 1.61*  CALCIUM 7.6* 7.6*  MG 2.7*  --    GFR: Estimated Creatinine Clearance: 28.1 mL/min (A) (by C-G formula based on SCr of 1.61 mg/dL (H)). Liver Function Tests: Recent Labs  Lab 01/28/20 0625 01/29/20 0307  AST 48* 42*  ALT 27 25  ALKPHOS 94 87  BILITOT 3.9* 3.1*  PROT 5.0* 5.2*  ALBUMIN 3.0* 3.3*    Recent Labs  Lab 01/27/20 1615 01/28/20 0625 01/29/20 0307  AMMONIA 142* 16 62*   Coagulation Profile: Recent Labs  Lab 01/28/20 0625 01/29/20 0307  INR 1.8* 2.0*     Recent Results (from the past 240 hour(s))  Culture, blood (routine x 2)     Status: None   Collection Time: 01/25/20 11:08 AM   Specimen: BLOOD  Result Value Ref Range Status   Specimen Description   Final    BLOOD LEFT ANTECUBITAL Performed at [facility], [place] [address] [place], [place] [address]    Special Requests   Final    BOTTLES DRAWN AEROBIC AND ANAEROBIC Blood Culture adequate volume Performed at [place] [address], [place], [place] [address]    Culture   Final    NO GROWTH 5 DAYS Performed at [facility], [place] [address] [place], [place] [address]    Report Status 01/30/2020 FINAL  Final  Urine culture     Status: None   Collection Time: 01/25/20 11:08 AM   Specimen: Urine, Clean Catch  Result Value Ref Range Status   Specimen Description   Final    URINE, CLEAN CATCH Performed at [facility], [place] [address], [place], [place] [address]    Special Requests   Final    NONE Performed at [facility], [place] [address], [place], [place] [address]    Culture   Final    NO GROWTH Performed at [facility], [place] [address], [place], [place] [address]    Report Status 01/26/2020 FINAL  Final  SARS Coronavirus 2 by RT PCR (hospital order,  performed in [facility] hospital lab) Nasopharyngeal Nasopharyngeal Swab     Status: None   Collection Time: 01/25/20 12:05 PM   Specimen: Nasopharyngeal Swab  Result Value Ref Range Status   SARS Coronavirus 2 NEGATIVE NEGATIVE Final    Comment: (NOTE) SARS-CoV-2 target nucleic acids are NOT DETECTED.  The SARS-CoV-2 RNA is generally detectable in upper and lower respiratory  specimens during the acute phase of infection. The lowest concentration of SARS-CoV-2 viral copies this assay can detect is 250 copies / mL. A negative result does not preclude SARS-CoV-2 infection and should not be used as the sole basis for treatment or other patient management decisions.  A negative result may occur with improper specimen collection / handling, submission of specimen other than nasopharyngeal swab, presence of viral mutation(s) within the areas targeted by this assay, and inadequate number of viral copies (<250 copies / mL). A negative result must be combined with clinical observations, patient history, and epidemiological information.  Fact Sheet for Patients:   StrictlyIdeas.no  Fact Sheet for Healthcare Providers: BankingDealers.co.za  This test is not yet approved or  cleared by the [place] FDA and has been authorized for detection and/or diagnosis of SARS-CoV-2 by FDA under an Emergency Use Authorization (EUA).  This EUA will remain in effect (meaning this test can be used) for the duration of the COVID-19 declaration under Section 564(b)(1) of the Act, 21 U.S.C. section 360bbb-3(b)(1), unless the authorization is terminated or revoked sooner.  Performed at [facility], [place] [address] [place] [address] [place], [place] [address]       Radiology Studies: No results found.   LOS: 9 days   Bonnielee Haff, MD [facility]  02/03/2020, 12:04 PM

## 2020-02-03 NOTE — Progress Notes (Signed)
   Daily Progress Note   Patient Name: [name]       Date: 02/03/2020 DOB: [birth year]  Age: 69 y.o. MRN#: [id number] Attending Physician: Bonnielee Haff, MD Primary Care Physician: Wenda Low, MD Admit Date: 01/25/2020  Reason for Consultation/Follow-up: Establishing goals of care and Terminal Care  Subjective: Patient continues in the active stages of dying. In no distress. Unresponsive.    Length of Stay: 9 days  Vital Signs: BP (!) 142/64 (BP Location: Left Arm)   Pulse (!) 45   Temp (!) 97.2 F (36.2 C) (Axillary) Comment: RN Notified warm blankets applied  Resp 14   Ht 5\' 4"  (1.626 m)   Wt 54.8 kg   SpO2 90%   BMI 20.74 kg/m  SpO2: SpO2: 90 % O2 Device: O2 Device: Room Air O2 Flow Rate: O2 Flow Rate (L/min): 2 L/min  Physical Exam: -unresponsive to sternal rub, has petechiae -shallow, regular respirations. Accessory muscle usage -heart sounds normal -skin warm, dry, scattered ecchymosis, no mottling  Palliative Care Assessment & Plan    Code Status:  DNR  Goals of Care/Recommendations:  Continue comfort measures only.  Continue prn symptom management medications and scheduled anti-epileptics to ensure comfort as she nears EOL. Consider scheduled morphine if requiring frequent prn.   Unrestricted visitor access as patient is nearing EOL.   PMT provider will continue to follow for symptom needs and support to family.    Prognosis: Hours - Days  Discharge Planning: Anticipated Hospital Death  Thank you for allowing the Palliative Medicine Team to assist in the care of this patient.  Time Total: 69min  Greater than 50% of this time was spent counseling and coordinating care related to the above assessment and plan.   Loistine Chance MD Palliative Medicine Team  Phone: [phone] Fax: [phone]

## 2020-02-04 MED ORDER — ONDANSETRON HCL 4 MG/2ML IJ SOLN: 4.0000 mg | Freq: Four times a day (QID) | INTRAMUSCULAR | 0 refills | Status: AC | PRN

## 2020-02-04 MED ORDER — LORAZEPAM 2 MG/ML IJ SOLN: 1.0000 mg | Freq: Two times a day (BID) | INTRAMUSCULAR | 0 refills | Status: AC

## 2020-02-04 MED ORDER — POLYVINYL ALCOHOL 1.4 % OP SOLN: 1.0000 [drp] | Freq: Four times a day (QID) | OPHTHALMIC | 0 refills | Status: AC | PRN

## 2020-02-04 MED ORDER — MORPHINE SULFATE (PF) 2 MG/ML IV SOLN: 1.0000 mg | INTRAVENOUS | 0 refills | Status: AC | PRN

## 2020-02-04 MED ORDER — GLYCOPYRROLATE 0.2 MG/ML IJ SOLN: 0.3000 mg | INTRAMUSCULAR | Status: AC | PRN

## 2020-02-04 MED ORDER — LORAZEPAM 2 MG/ML IJ SOLN: 1.0000 mg | INTRAMUSCULAR | 0 refills | Status: AC | PRN

## 2020-02-04 NOTE — Progress Notes (Signed)
PROGRESS NOTE  [name] TOI:[id number] DOB: September 08, 1950 DOA: 01/25/2020 PCP: Wenda Low, MD   Brief Narrative: 69 y.o. female with medical history significant of alcoholic cirrhosis with ascites, hx of etoh abuse, tobacco abuse, hx lung cancer, tongue tumor, HTN presents to ED from SNF for increased confusion, refusing meds and increased jaundice, edema. She was discharged recently on 7/5, admitted 01/04/20:following admission for treatment of ETOH hepatitis with ascites, encephalopathy/?wernicke's- felt unsafe for home due to confusion and sent to SNF. prior to last admit heavy etoh use 2 bottles wine/daily, living by herself. Since discharge to SNF,pt has been refusing meds and food per daughter and noted to be more edematous after not taking meds.  She was hospitalized due to concern for seizure activity.   Subjective: Remains unresponsive.  Assessment & Plan:  Acute encephalopathy/Hepatic encephalopathy in the setting of cirrhosis/UTI During previous hospitalization there was concern for Wernicke's encephalopathy.  Due to safety concern she was sent to skilled nursing facility.  Prior to previous admission she did have a history of significant alcohol use.   MRI brain 7/18 limited by motion but did show evidence of seizure edema on the left hemisphere with clinical correlate with right-sided twitching.  Neurology urgently evaluated the patient 7/18- and discussed with the daughter and the niece-option was given regarding transfer to [facility] and monitoring on continuous EEG but given that patient is DO NOT RESUSCITATE DO NOT INTUBATE as per her wishes, family declined transfer and wanted to see how she does and patient was given Keppra, Dilantin and EEG was ordered for the morning. EEG showed evidence of epileptogenic discharge in left hemisphere, maximal left posterior quadrant, periodic epileptiform discharges have overlying fast activity at times are rhythmic which is on the  ictal-interictal continuum with a high potential for seizures, additionally with evidence of severe diffuse encephalopathy nonspecific etiology.  Despite adding Vimpat to Dilantin and Keppra patient did not improve. Case was discussed with neurology.  Recommendation for was either to intubate or do a burst suppression versus comfort measures.   Palliative care was following.  Discussions were held with family.  Subsequently she was transitioned to comfort care.   Antiepileptic drugs were discontinued.  Continue as needed Ativan for seizure type activity.   Patient remained stable.  Discussed with palliative care yesterday regarding disposition options.  They will reevaluate today.    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  Did not improve despite multiple AEDs.  Now comfort measures.  Acute alcoholic hepatitis/alcoholic liver disease with Hypoalbuminemia Patient with liver failure and coagulopathy with worsening INR.  Also noted to have elevated ammonia level.  Has been on lactulose.   CT abdomen showed hepatic cirrhosis. Diuretics-lasix/aldactone were on hold due to AKI. Last admission she had ultrasound-guided paracentesis yielding 2.4 L fluid.   MELD score 23  Hypotension Likely multifactorial from volume depletion, hypoalbuminemia, from hypoalbuminemia in the setting of liver cirrhosis, antiseizure medication.    Acute hypoxic respiratory failure In the setting of encephalopathy, also layering pleural effusion atelectasis from associated hypoalbuminemia/third spacing.  AKI Likely multifactorial etiology.  Acute lower UTI POA S/p 4 doses of ceftriaxone.  Cultures have been negative.  Antibiotics were discontinued. Mild lactic acidosis had resolved.   COPD Chest x-ray unremarkable.  Thrombocytopenia Platelet count is low but stable.  Likely due to her liver disease.  Low-grade bronchogenic carcinoma noted in the CT  scan Also history of mouth tumor.  Patient apparently did not want  to pursue further management of these suspected malignancies.   Macrocytic anemia Suspect from alcohol abuse/from chronic liver disease.  Fecal impaction/constipation Was manually disimpacted and also received Dulcolax suppository 7/17.  Also on lactulose enema  Essential Tremors hx As per daughter she is on pimodine for same. She has tremors on both of her hand and at times voice gets shaky from her tremors as per daughter.  Moderate protein calorie malnutrition   GOC DNR.  Comfort measures.  DVT prophylaxis: Comfort measures Code Status: DNR Family Communication: No family at bedside.    Diet Order            Diet NPO time specified Except for: Other (See Comments)  Diet effective now                 Body mass index is 20.74 kg/m.   Consultants: Palliative care.  Neurology.   Microbiology: Blood Culture    Component Value Date/Time   SDES  01/25/2020 1108    BLOOD LEFT ANTECUBITAL Performed at [facility], [place] [address], [place], [place] [address]    SDES  01/25/2020 1108    URINE, CLEAN CATCH Performed at [facility], [place] [address], [place], [place] [address]    SPECREQUEST  01/25/2020 1108    BOTTLES DRAWN AEROBIC AND ANAEROBIC Blood Culture adequate volume Performed at [place] [address], [place], [place] [address]    SPECREQUEST  01/25/2020 1108    NONE Performed at [facility], [place] [address] [place], [place] [address]    CULT  01/25/2020 1108    NO GROWTH 5 DAYS Performed at [facility], [place] [address] [place], [place] [address]    CULT  01/25/2020 1108    NO GROWTH Performed at [facility], [place] [address] [place], [place] [address]    REPTSTATUS 01/30/2020 FINAL 01/25/2020 1108   REPTSTATUS 01/26/2020 FINAL 01/25/2020 1108    Other culture-see  note  Medications: Scheduled Meds: . LORazepam  1 mg Intravenous Q12H   Continuous Infusions: . sodium chloride 10 mL/hr at 01/29/20 1537    Antimicrobials: Anti-infectives (From admission, onward)   Start     Dose/Rate Route Frequency Ordered Stop   01/26/20 1230  rifaximin (XIFAXAN) tablet 550 mg  Status:  Discontinued        550 mg Oral 2 times daily 01/26/20 1143 01/29/20 1501   01/26/20 1000  cefTRIAXone (ROCEPHIN) 2 g in sodium chloride 0.9 % 100 mL IVPB  Status:  Discontinued        2 g 200 mL/hr over 30 Minutes Intravenous Every 24 hours 01/25/20 2333 01/28/20 1639   01/25/20 1200  cefTRIAXone (ROCEPHIN) 1 g in sodium chloride 0.9 % 100 mL IVPB        1 g 200 mL/hr over 30 Minutes Intravenous  Once 01/25/20 1151 01/25/20 1234       Objective: Vitals: Today's Vitals   01/30/20 1100 01/31/20 0745 02/03/20 1716 02/04/20 0754  BP:   (!) 91/46   Pulse:   86   Resp: 14  12   Temp:   98.2 F (36.8 C)   TempSrc:      SpO2: 90%  93%   Weight:      Height:      PainSc:  Asleep  0-No pain    Intake/Output Summary (Last 24 hours) at 02/04/2020 1207 Last data filed at 02/03/2020 1807 Gross per 24 hour  Intake --  Output 200 ml  Net -200 ml   Filed Weights   01/25/20 1059 01/28/20 1822  Weight: 52 kg 54.8 kg   Weight change:    Intake/Output from previous day: 07/25 0701 - 07/26 0700 In: -  Out: 200 [Urine:200] Intake/Output this shift: No intake/output data recorded.  Examination:  Patient remains unresponsive.  S1-S2 normal regular.  Shallow respirations.   Data Reviewed: I have personally reviewed following labs and imaging studies CBC: Recent Labs  Lab 01/29/20 0307  WBC 10.2  HGB 7.0*  HCT 22.3*  MCV 110.4*  PLT 076*   Basic Metabolic Panel: Recent Labs  Lab 01/29/20 0307  NA 143  K 3.3*  CL 107  CO2 22  GLUCOSE 111*  BUN 28*  CREATININE 1.61*  CALCIUM 7.6*   GFR: Estimated Creatinine Clearance: 28.1 mL/min (A) (by C-G formula  based on SCr of 1.61 mg/dL (H)). Liver Function Tests: Recent Labs  Lab 01/29/20 0307  AST 42*  ALT 25  ALKPHOS 87  BILITOT 3.1*  PROT 5.2*  ALBUMIN 3.3*    Recent Labs  Lab 01/29/20 0307  AMMONIA 62*   Coagulation Profile: Recent Labs  Lab 01/29/20 0307  INR 2.0*     No results found for this or any previous visit (from the past 240 hour(s)).    Radiology Studies: No results found.   LOS: 10 days   Bonnielee Haff, MD [facility]  02/04/2020, 12:07 PM

## 2020-02-04 NOTE — Progress Notes (Addendum)
   Daily Progress Note   Patient Name: [name]       Date: 02/04/2020 DOB: 1950/11/03  Age: 69 y.o. MRN#: [id number] Attending Physician: Bonnielee Haff, MD Primary Care Physician: Wenda Low, MD Admit Date: 01/25/2020  Reason for Consultation/Follow-up: Establishing goals of care and Terminal Care  Subjective: Patient continues in the active stages of dying. In no distress. Unresponsive.    Length of Stay: 10 days  Vital Signs: BP (!) 91/46 (BP Location: Left Arm)   Pulse 86   Temp 98.2 F (36.8 C)   Resp 12   Ht 5\' 4"  (1.626 m)   Wt 54.8 kg   SpO2 93%   BMI 20.74 kg/m  SpO2: SpO2: 93 % O2 Device: O2 Device: Room Air O2 Flow Rate: O2 Flow Rate (L/min): 2 L/min  Physical Exam: -unresponsive to sternal rub, has petechiae -shallow, regular respirations. Accessory muscle usage -heart sounds normal -skin warm, dry, scattered ecchymosis, no mottling  Palliative Care Assessment & Plan    Code Status:  DNR  Goals of Care/Recommendations:  Continue comfort measures only.  Continue prn symptom management medications and scheduled anti-epileptics to ensure comfort as she nears EOL. Consider scheduled morphine if requiring frequent prn.   Unrestricted visitor access as patient is nearing EOL.   PMT provider will continue to follow for symptom needs and support to family.    Prognosis: Hours - Days Patient does not have apneic spells, has regular work of breathing. S 1 S 2 regular. Remains unresponsive. Extremities warm, no coolness some mottling.   Discharge Planning: Anticipated Hospital Death vs residential hospice.  Could consider transfer to residential hospice for ongoing focus on symptom management at end of life and to singularly focus on continuing comfort and dignity focused care for Ms Brutus. Called daughter [name] to discuss, she is agreement to proceed with [employer]. CSW consulted, discussed with TRH MD.   Thank you for allowing the Palliative  Medicine Team to assist in the care of this patient.  Time Total: 55min  Greater than 50% of this time was spent counseling and coordinating care related to the above assessment and plan.   Loistine Chance MD Palliative Medicine Team  Phone: [phone] Fax: [phone]

## 2020-02-04 NOTE — Discharge Instructions (Signed)
Hospice Hospice is a service that is designed to provide people who are terminally ill and their families with medical, spiritual, and psychological support. Its aim is to improve your quality of life by keeping you as comfortable as possible in the final stages of life. Who will be my providers when I begin hospice care? Hospice teams often include:  A nurse.  A doctor. The hospice doctor will be available for your care, but you can include your regular doctor or nurse practitioner.  A social worker.  A counselor.  A religious leader (such as a chaplain).  A dietitian.  Therapists.  Trained volunteers who can help with care. What services does hospice provide? Hospice services can vary depending on the center or organization. Generally, they include:  Ways to keep you comfortable, such as: ? Providing care in your home or in a home-like setting. ? Working with your family and friends to help meet your needs. ? Allowing you to enjoy the support of loved ones by receiving much of your basic care from family and friends.  Pain relief and symptom management. The staff will supply all necessary medicines and equipment so that you can stay comfortable and alert enough to enjoy the company of your friends and family.  Visits or care from a nurse and doctor. This may include 24-hour on-call services.  Companionship when you are alone.  Allowing you and your family to rest. Hospice staff may do light housekeeping, prepare meals, and run errands.  Counseling. They will make sure your emotional, spiritual, and social needs are being met, as well as those needs of your family members.  Spiritual care. This will be individualized to meet your needs and your family's needs. It may involve: ? Helping you and your family understand the dying process. ? Helping you say goodbye to your family and friends. ? Performing a specific religious ceremony or ritual.  Massage.  Nutrition  therapy.  Physical and occupational therapy.  Short-term inpatient care, if something cannot be managed in the home.  Art or music therapy.  Bereavement support for grieving family members. When should hospice care begin? Most people who use hospice are believed to have less than 6 months to live.  Your family and health care providers can help you decide when hospice services should begin.  If you live longer than 6 months but your condition does not improve, your doctor may be able to approve you for continued hospice care.  If your condition improves, you may discontinue the program. What should I consider before selecting a program? Most hospice programs are run by nonprofit, independent organizations. Some are affiliated with hospitals, nursing homes, or home health care agencies. Hospice programs can take place in your home or at a hospice center, hospital, or skilled nursing facility. When choosing a hospice program, ask the following questions:  What services are available to me?  What services will be offered to my loved ones?  How involved will my loved ones be?  How involved will my health care provider be?  Who makes up the hospice care team? How are they trained or screened?  How will my pain and symptoms be managed?  If my circumstances change, can the services be provided in a different setting, such as my home or in the hospital?  Is the program reviewed and licensed by the state or certified in some other way?  What does it cost? Is it covered by insurance?  If I choose a hospice   center or nursing home, where is the hospice center located? Is it convenient for family and friends?  If I choose a hospice center or nursing home, can my family and friends visit any time?  Will you provide emotional and spiritual support?  Who can my family call with questions? Where can I learn more about hospice? You can learn about existing hospice programs in your area  from your health care providers. You can also read more about hospice online. The websites of the following organizations have helpful information:  [facility] and Palliative Care Organization [facility]): http://www.brown-buchanan.com/  National Association for [facility]): http://massey-hart.com/  Hospice Foundation of America ([place]): www.hospicefoundation.org  American Cancer Society (ACS): www.cancer.org  Hospice Net: www.hospicenet.org  Visiting Nurse Associations of [place] (VNAA): www.vnaa.org You may also find more information by contacting the following agencies:  A local agency on aging.  Your local [employer] chapter.  Your state's department of health or social services. Summary  Hospice is a service that is designed to provide people who are terminally ill and their families with medical, spiritual, and psychological support.  Hospice aims to improve your quality of life by keeping you as comfortable as possible in the final stages of life.  Hospice teams often include a doctor, nurse, social worker, counselor, religious leader,dietitian, therapists, and volunteers.  Hospice care generally includes medicine for symptom management, visits from doctors and nurses, physical and occupational therapy, nutrition counseling, spiritual and emotional counseling, caregiver support, and bereavement support for grieving family members.  Hospice programs can take place in your home or at a hospice center, hospital, or skilled nursing facility. This information is not intended to replace advice given to you by your health care provider. Make sure you discuss any questions you have with your health care provider. Document Revised: 03/21/2019 Document Reviewed: 07/20/2016 Elsevier Patient Education  [place].

## 2020-02-04 NOTE — TOC Transition Note (Addendum)
Transition of Care [facility]) - CM/SW Discharge Note   Patient Details  Name: [name] MRN: [id number] Date of Birth: 1950-10-15  Transition of [facility]) CM/SW Contact:  Lynnell Catalan, RN Phone Number: 02/04/2020, 3:24 PM   Clinical Narrative:    [facility] consult for dc to [facility]. This CM contacted [facility] for referral. TOC will continue to follow.   Final next level of care: [place] Barriers to Discharge: No Barriers Identified   Patient Goals and CMS Choice Patient states their goals for this hospitalization and ongoing recovery are:: daughter-back to snf versus comfort care CMS Medicare.gov Compare Post Acute Care list provided to:: Patient Represenative (must comment) Choice offered to / list presented to : Adult Children Readmission Risk Interventions Readmission Risk Prevention Plan 01/08/2020  Transportation Screening Complete  HRI or Home Care Consult Complete  Social Work Consult for [place] Planning/Counseling Complete  Palliative Care Screening Not Applicable  Medication Review [employer] photographer) Complete  Some recent data might be hidden

## 2020-02-04 NOTE — Progress Notes (Signed)
Manufacturing engineer [facility])  Referral received for residential hospice at [facility].    We do not have any further bed availability today.    She appears to be actively dying.  Will update TOC manager and family once a bed is available.  Venia Carbon RN, BSN, [facility] Liaison

## 2020-02-05 NOTE — Progress Notes (Addendum)
Manufacturing engineer [facility]) [name] has a bed for [name] today.  TOC manager and family notified.  Consents need to be completed prior to transport.  [facility] will update TOC manager once this is completed, then transport can be arranged.  Updated step-daughter [name] with plan.  RN staff, may call report at any time to [phone].  Please fax d/c summary to [phone] once completed.  Venia Carbon RN, BSN, [facility] Liaison    **necessary consents are completed, transportation can be arranged.  TOC manager notified.

## 2020-02-05 NOTE — Progress Notes (Signed)
   Daily Progress Note   Patient Name: [name]       Date: 02/05/2020 DOB: November 06, 1950  Age: 69 y.o. MRN#: [id number] Attending Physician: Bonnielee Haff, MD Primary Care Physician: Wenda Low, MD Admit Date: 01/25/2020  Reason for Consultation/Follow-up: Terminal Care  Subjective: Patient continues in the active stages of dying. Unresponsive to sternal rub but appears comfortable without s/s of distress or discomfort.   No family at bedside. Dr. Rowe Pavy spoke with daughter, [name] on 7/26. Agreed on hospice facility transfer when bed available.    Length of Stay: 11 days  Vital Signs: BP (!) 92/48 (BP Location: Right Arm)   Pulse 69   Temp 98.2 F (36.8 C)   Resp 18   Ht 5\' 4"  (1.626 m)   Wt 54.8 kg   SpO2 93%   BMI 20.74 kg/m  SpO2: SpO2: 93 % O2 Device: O2 Device: Room Air O2 Flow Rate: O2 Flow Rate (L/min): 2 L/min  Physical Exam: -unresponsive to sternal rub, has petechiae -shallow, regular respirations. No apnea -heart sounds normal -skin cool, dry, scattered ecchymosis, no mottling  Palliative Care Assessment & Plan    Code Status:  DNR  Goals of Care/Recommendations:  Continue comfort measures only.  Continue prn symptom management medications.  Unrestricted visitor access as patient is nearing EOL.   Waiting on residential hospice bed. Stable for transfer today if bed available.   Prognosis: days-hours  Discharge Planning: Waiting on hospice bed   Thank you for allowing the Palliative Medicine Team to assist in the care of this patient.  Time Total: 24min  Greater than 50% of this time was spent counseling and coordinating care related to the above assessment and plan.   Ihor Dow, DNP, FNP-C Palliative Medicine Team  Phone: [phone] Fax: [phone]

## 2020-02-05 NOTE — Progress Notes (Signed)
Pt discharged to [facility] via [place]. Family at bedside at time of discharge.

## 2020-02-05 NOTE — Progress Notes (Signed)
Report called to Arbie Cookey, Therapist, sports at [facility]. Awaiting transport for pickup.

## 2020-02-05 NOTE — Discharge Summary (Signed)
[facility]  Physician Discharge Summary   Patient ID: [name] MRN: [id number] DOB/AGE: 1950/10/15 69 y.o.  Admit date: 01/25/2020 Discharge date: 02/05/2020  PCP: Wenda Low, MD  DISCHARGE DIAGNOSES:  Disease Hepatic encephalopathy Seizure activity/status epilepticus Acute kidney injury Acute UTI COPD Cytopenia History of bronchogenic carcinoma Macrocytic anemia  RECOMMENDATIONS FOR OUTPATIENT FOLLOW UP: 1. Patient being discharged to residential hospice    CODE STATUS: DNR  DISCHARGE CONDITION: stable  Diet recommendation: Comfort feeds as tolerated  INITIAL HISTORY: 69 y.o.femalewith medical history significant ofalcoholic cirrhosis with ascites, hx of etoh abuse, tobacco abuse, hx lung cancer, tongue tumor, HTN presents to ED from SNF for increased confusion, refusing meds and increased jaundice, edema. She was discharged recently on 7/5, admitted 01/04/20:following admission for treatment of ETOH hepatitis with ascites, encephalopathy/?wernicke's- felt unsafe for home due to confusion and sent to SNF. prior to last admit heavy etoh use 2 bottles wine/daily, living by herself. Since discharge to SNF,pt has been refusing meds and food per daughter and noted to be more edematous after not taking meds.  She was hospitalized due to concern for seizure activity.   Consultations:  Palliative care  Neurology   HOSPITAL COURSE:    Acute encephalopathy/Hepatic encephalopathy in the setting of cirrhosis/UTI During previous hospitalization there was concern for Wernicke's encephalopathy.  Due to safety concern she was sent to skilled nursing facility.  Prior to previous admission she did have a history of significant alcohol use.   MRI brain 7/18 limited by motion but did show evidence of seizure edema on the left hemisphere with clinical correlate with right-sided twitching.  Neurology urgently evaluated the patient 7/18- and discussed with the  daughter and the niece-option was given regarding transfer to [facility] and monitoring on continuous EEG but given that patient is DO NOT RESUSCITATE DO NOT INTUBATE as per her wishes, family declined transfer and wanted to see how she does and patient was given Keppra, Dilantin and EEG was ordered for the morning. EEG showed evidence of epileptogenic discharge in left hemisphere, maximal left posterior quadrant, periodic epileptiform discharges have overlying fast activity at times are rhythmic which is on the ictal-interictal continuum with a high potential for seizures, additionally with evidence of severe diffuse encephalopathy nonspecific etiology.  Despite adding Vimpat to Dilantin and Keppra patient did not improve. Case was discussed with neurology.  Recommendation for was either to intubate or do a burst suppression versus comfort measures.   Palliative care was following.  Discussions were held with family.  Subsequently she was transitioned to comfort care.   Antiepileptic drugs were discontinued.  Continue as needed Ativan for seizure type activity.   Patient has been stable for the last several days.  Plan is for her to go to residential hospice today.  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  Did not improve despite multiple AEDs.  Now comfort measures.  Acute alcoholic hepatitis/alcoholic liver disease with Hypoalbuminemia Patient with liver failure and coagulopathy with worsening INR.  Also noted to have elevated ammonia level.  Has been on lactulose.  CT abdomen showed hepatic cirrhosis. Diuretics-lasix/aldactone were on hold due to AKI. Last admission she had ultrasound-guided paracentesis yielding 2.4 L fluid.  MELD score 23  Hypotension Likely multifactorial from volume depletion, hypoalbuminemia, from hypoalbuminemia in the setting of liver cirrhosis, antiseizure medication.    Acute hypoxic respiratory  failure In the setting of encephalopathy, also layering pleural effusion atelectasis from associated hypoalbuminemia/third spacing.  AKI Likely multifactorial etiology.  Acute lower UTI POA S/p 4 doses of ceftriaxone.  Cultures have been negative.  Antibiotics were discontinued. Mild lactic acidosis had resolved.   COPD Chest x-ray unremarkable.  Thrombocytopenia Platelet count is low but stable.  Likely due to her liver disease.  Low-grade bronchogenic carcinoma noted in the CT scan Also history of mouth tumor.  Patient apparently did not want to pursue further management of these suspected malignancies.   Macrocytic anemia Suspect from alcohol abuse/from chronic liver disease.  Fecal impaction/constipation Was manually disimpacted and also received Dulcolax suppository 7/17.  Also on lactulose enema  Essential Tremors hx As per daughter she is on pimodine for same. She has tremors on both of her hand and at times voice gets shaky from her tremors as per daughter.  Moderate protein calorie malnutrition  Pressure injury Pressure Injury 01/05/20 Arm Right;Upper;Posterior Stage 2 -  Partial thickness loss of dermis presenting as a shallow open injury with a red, pink wound bed without slough. (Active)  01/05/20 1532  Location: Arm  Location Orientation: Right;Upper;Posterior  Staging: Stage 2 -  Partial thickness loss of dermis presenting as a shallow open injury with a red, pink wound bed without slough.  Wound Description (Comments):   Present on Admission: Yes   Okay for discharge to residential hospice.    PERTINENT LABS:  The results of significant diagnostics from this hospitalization (including imaging, microbiology, ancillary and laboratory) are listed below for reference.     Labs:  COVID-19 Labs  Lab Results  Component Value Date   [place] NEGATIVE 01/25/2020   [place] NEGATIVE 01/04/2020     IMAGING STUDIES CT ABDOMEN PELVIS WO  CONTRAST  Result Date: 01/26/2020 CLINICAL DATA:  Chronic liver disease. EXAM: CT ABDOMEN AND PELVIS WITHOUT CONTRAST TECHNIQUE: Multidetector CT imaging of the abdomen and pelvis was performed following the standard protocol without IV contrast. COMPARISON:  August 13, 2018.  June 21, 2018. FINDINGS: Lower chest: Small left pleural effusion is noted. Stable 12 mm nodule is noted in left lower lobe laterally Hepatobiliary: No gallstones are noted. No biliary dilatation is noted. The liver is small most consistent with hepatic cirrhosis. No definite focal hepatic abnormality is noted on these unenhanced images. Pancreas: Calcifications are noted in pancreatic head suggesting chronic pancreatitis. No definite acute abnormality or ductal dilatation is noted. Spleen: Normal in size without focal abnormality. Adrenals/Urinary Tract: Adrenal glands appear normal. Bilateral nonobstructive nephrolithiasis is noted. No hydronephrosis or renal obstruction is noted. Urinary bladder is decompressed. Stomach/Bowel: The stomach appears normal. Large amount of stool is noted in the rectum consistent with impaction. No other abnormal bowel dilatation is noted. The appendix is unremarkable. Vascular/Lymphatic: Aortic atherosclerosis. No enlarged abdominal or pelvic lymph nodes. Reproductive: Uterus and bilateral adnexa are unremarkable. Other: Moderate ascites is noted, most prominently seen around the liver and in the pelvis. Musculoskeletal: No acute or significant osseous findings. IMPRESSION: 1. Findings consistent with hepatic cirrhosis. Moderate ascites is noted. 2. Bilateral nonobstructive nephrolithiasis. No hydronephrosis or renal obstruction is noted. 3. Large amount of stool is noted in the rectum consistent with impaction. 4. Stable 12 mm nodule is noted in left lower lobe laterally. This may represent low-grade bronchogenic carcinoma as described on the prior PET scan of June 21, 2018. Aortic Atherosclerosis  (ICD10-I70.0). Electronically Signed   By: Marijo Conception M.D.   On: 01/26/2020 15:42   DG Chest 1 View  Result Date: 01/28/2020 CLINICAL DATA:  69 year old female with hypoxia. EXAM:  CHEST  1 VIEW COMPARISON:  Chest radiograph dated 01/25/2020. FINDINGS: There is diffuse hazy density throughout the left lung, likely combination of layering pleural effusion and associated atelectasis. Pneumonia is not excluded. Clinical correlation is recommended. Probable small right pleural effusion and right lung base atelectasis. Surgical clips in the region of the right hilum with linear scarring similar to prior radiograph. No pneumothorax. Stable top-normal cardiac size. Atherosclerotic calcification of the aorta. No acute osseous pathology. Left shoulder arthroplasty. IMPRESSION: 1. Diffuse hazy density throughout the left lung, worsened since the prior radiograph and likely combination of layering effusion and associated atelectasis. Infiltrate is not excluded. 2. Unchanged appearance of the right lung. Electronically Signed   By: Anner Crete M.D.   On: 01/28/2020 19:44   CT HEAD WO CONTRAST  Result Date: 01/26/2020 CLINICAL DATA:  Confusion, cephalopathy. EXAM: CT HEAD WITHOUT CONTRAST TECHNIQUE: Contiguous axial images were obtained from the base of the skull through the vertex without intravenous contrast. However, exam is somewhat limited due to patient motion artifact. COMPARISON:  August 01, 2008. FINDINGS: Brain: Mild chronic ischemic white matter disease is noted. No mass effect or midline shift is noted. Ventricular size is within normal limits. There is no evidence of mass lesion, hemorrhage or acute infarction. Vascular: No hyperdense vessel or unexpected calcification. Skull: Normal. Negative for fracture or focal lesion. Sinuses/Orbits: No acute finding. Other: None. IMPRESSION: Mild chronic ischemic white matter disease. No acute intracranial abnormality seen. However, exam is limited due to  persistent patient motion artifact. Electronically Signed   By: Marijo Conception M.D.   On: 01/26/2020 12:17   CT PELVIS WO CONTRAST  Result Date: 01/27/2020 CLINICAL DATA:  Abdomen and pelvic distention with a clinical concern for ascites or bladder distention. EXAM: CT PELVIS WITHOUT CONTRAST TECHNIQUE: Multidetector CT imaging of the pelvis was performed following the standard protocol without intravenous contrast. COMPARISON:  01/26/2020 FINDINGS: Urinary Tract: Normal appearing urinary bladder with no bladder dilatation. Bowel:  Prominent stool in the rectum.  No dilated bowel loops seen. Vascular/Lymphatic: Atheromatous calcifications. No enlarged lymph nodes. Reproductive:  No mass or other significant abnormality Other:  Moderate amount of ascites without significant change. Musculoskeletal: Lower lumbar spine degenerative changes. IMPRESSION: 1. No acute abnormality. 2. Moderate amount of ascites without significant change. 3. Prominent stool in the rectum. 4. Normal urinary bladder. Electronically Signed   By: Claudie Revering M.D.   On: 01/27/2020 17:02   MR BRAIN WO CONTRAST  Result Date: 01/27/2020 CLINICAL DATA:  Encephalopathy EXAM: MRI HEAD WITHOUT CONTRAST TECHNIQUE: Multiplanar, multiecho pulse sequences of the brain and surrounding structures were obtained without intravenous contrast. COMPARISON:  None. FINDINGS: Significant motion artifact is present. Findings below are within this significant limitation. Brain: There is apparent reduced diffusion in the left parietal and occipital lobes without definite corresponding abnormality on T2 images. 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 Chronic infarct of the left occipitotemporal lobes with ex vacuo dilatation of the adjacent lateral ventricle. Patchy foci of T2 hyperintensity in the supratentorial white matter are nonspecific but may reflect mild  chronic microvascular ischemic changes. Prominence of the ventricles and sulci reflects generalized parenchymal volume loss. No significant mass effect or extra-axial fluid collection. Vascular: Major vessel flow voids at the skull base are preserved. Skull and upper cervical spine: Marrow signal grossly within normal limits. Sinuses/Orbits: Paranasal sinuses are aerated. Orbits are unremarkable. Other: Mastoid air cells are clear. IMPRESSION: Significantly motion degraded  study. Apparent abnormal diffusion signal in the left parietal and occipital lobes, with common differential considerations including seizure effect and acute infarction (less likely given multiple vascular territories and no abnormality on other sequences). 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 Mild chronic microvascular ischemic changes. These results were called by telephone at the time of interpretation on 01/27/2020 at 4:29 pm to provider [facility] [employer] [facility] , who verbally acknowledged these results. Electronically Signed   By: Macy Mis M.D.   On: 01/27/2020 16:43   US RENAL  Result Date: 01/27/2020 CLINICAL DATA:  Acute kidney injury. EXAM: RENAL / URINARY TRACT ULTRASOUND COMPLETE COMPARISON:  CT abdomen and pelvis 01/26/2020. FINDINGS: Right Kidney: Renal measurements: 10.6 x 4.3 x 5.7 cm = volume: 138.1 mL . Echogenicity within normal limits. Small stone seen on CT are not visible on this exam. No mass or hydronephrosis visualized. Left Kidney: Renal measurements: 10.8 x 5.3 x 4.8 cm = volume: 141.5 mL. Echogenicity within normal limits. Small stones seen on CT are not visible on this exam. No mass or hydronephrosis visualized. Bladder: Appears normal for degree of bladder distention. Other: Cirrhotic liver and ascites are noted as seen on prior CT. IMPRESSION: Negative for hydronephrosis or acute abnormality. Cirrhotic liver and  ascites is seen on CT. Electronically Signed   By: Inge Rise M.D.   On: 01/27/2020 12:31   DG Chest Port 1 View  Result Date: 01/25/2020 CLINICAL DATA:  Altered mental status EXAM: PORTABLE CHEST 1 VIEW COMPARISON:  01/04/2020 FINDINGS: Unchanged elevation of the right hemidiaphragm. No acute appearing airspace opacity. The heart and mediastinum are unremarkable. Left shoulder reverse total arthroplasty. IMPRESSION: Unchanged elevation of the right hemidiaphragm. No acute appearing airspace opacity. Electronically Signed   By: Eddie Candle M.D.   On: 01/25/2020 12:20   EEG adult  Result Date: 01/28/2020 Lora Havens, MD     01/28/2020  2:40 PM Patient Name: [name] MRN: [id number] Epilepsy Attending: Lora Havens Referring Physician/Provider: Dr Amie [place] Date: 01/28/2020 Duration: 24.14 mins Patient history: 69 year old woman with extensive past medical history of alcohol abuse, alcoholic cirrhosis with ascites, lung cancer, hypertension, coming in with progressive worsening mentation, refusal to take medication, on examination appears to be having right-sided focal seizures along with hepatic encephalopathy, with labs revealing ammonia in the 142 level range, worsening renal function and MRI that is suggestive of seizure edema on the left hemisphere with clinical correlate with right-sided twitching. EEG to evaluate for seizure Level of alertness:  lethargic AEDs during EEG study: PHT, LEV Technical aspects: This EEG study was done with scalp electrodes positioned according to the 10-20 International system of electrode placement. Electrical activity was acquired at a sampling rate of 500Hz  and reviewed with a high frequency filter of 70Hz  and a low frequency filter of 1Hz . EEG data were recorded continuously and digitally stored. Description: EEG showed periodic lateralized epileptiform discharges in left hemisphere, maximal left posterior quadrant , at 1Hz  with overriding fast  activity and at times rhythmic. There is also continuous generalized 3 to 6 Hz theta-delta slowing.  Hyperventilation and photic stimulation were not performed.   ABNORMALITY -Periodic epileptiform discharges, left hemisphere, maximal left posterior quadranr -Continuous slow, generalized IMPRESSION: This study showed evidence of epileptogenicity in left hemisphere, maximal left posterior quadrant likely secondary to underlying cortical  abnormality. The periodic epileptiform discharges discharges have overlying fast activity and at times are rhythmic which is on  the ictal-interictal continuum with high potential for seizures. Additionally, there is evidence of severe diffuse encephalopathy, non specific to etiology. NO definite seizures were seen. Dr Aroor was immediately notified. Priyanka O Yadav   US Abdomen Limited RUQ  Result Date: 01/26/2020 CLINICAL DATA:  Abnormal liver function tests. EXAM: ULTRASOUND ABDOMEN LIMITED RIGHT UPPER QUADRANT COMPARISON:  None. FINDINGS: Gallbladder: The gallbladder is normally distended. There is a moderate amount of gallbladder sludge. Borderline gallbladder wall thickening of 3.4 mm. Given the presence of ascites, this mild thickening may be reactive. Common bile duct: Diameter: 2.3 mm Liver: Increased echogenicity and heterogeneous echotexture. 7 mm benign-appearing sonographic right liver cyst is noted. 9 mm benign-appearing sonographically left liver cyst is noted. Portal vein is patent on color Doppler imaging with normal direction of blood flow towards the liver. Other: Moderate to large amount of perihepatic ascites. IMPRESSION: Increased echogenicity and heterogeneous echotexture of the liver, consistent with chronic liver disease. Moderate to large amount of perihepatic ascites. Gallbladder sludge with borderline thickening of the gallbladder wall, possibly reactive to the presence of ascites. Electronically Signed   By: Fidela Salisbury M.D.   On: 01/26/2020  16:08    DISCHARGE EXAMINATION: Vitals:   01/30/20 1000 01/30/20 1100 02/03/20 1716 02/05/20 0621  BP:   (!) 91/46 (!) 92/48  Pulse: (!) 45  86 69  Resp: 14 14 12 18   Temp:   98.2 F (36.8 C)   TempSrc:      SpO2: 100% 90% 93% 93%  Weight:      Height:       Patient is nonresponsive.  DISPOSITION: Residential hospice, [facility]    Allergies as of 02/05/2020      Reactions   Codeine Other (See Comments)   Shakes and tremors   Tape Other (See Comments)   Patient PREFERS paper tape      Medication List    STOP taking these medications   busPIRone 10 MG tablet Commonly known as: BUSPAR   escitalopram 10 MG tablet Commonly known as: LEXAPRO   folic acid 1 MG tablet Commonly known as: FOLVITE   furosemide 20 MG tablet Commonly known as: LASIX   lactulose 10 GM/15ML solution Commonly known as: CHRONULAC   multivitamin with minerals Tabs tablet   ondansetron 4 MG tablet Commonly known as: ZOFRAN Replaced by: ondansetron 4 MG/2ML Soln injection   predniSONE 10 MG tablet Commonly known as: DELTASONE   PRESCRIPTION MEDICATION   ProAir HFA 108 (90 Base) MCG/ACT inhaler Generic drug: albuterol   senna-docusate 8.6-50 MG tablet Commonly known as: Senokot-S   spironolactone 25 MG tablet Commonly known as: ALDACTONE   thiamine 100 MG tablet     TAKE these medications   glycopyrrolate 0.2 MG/ML injection Commonly known as: ROBINUL Inject 1.5 mLs (0.3 mg total) into the vein every 4 (four) hours as needed (excessive secretions).   LORazepam 2 MG/ML injection Commonly known as: ATIVAN Inject 0.5 mLs (1 mg total) into the vein every 12 (twelve) hours.   LORazepam 2 MG/ML injection Commonly known as: ATIVAN Inject 0.5 mLs (1 mg total) into the vein every 4 (four) hours as needed for anxiety or seizure.   morphine 2 MG/ML injection Inject 0.5-1 mLs (1-2 mg total) into the vein every hour as needed (air hunger).   ondansetron 4 MG/2ML Soln  injection Commonly known as: ZOFRAN Inject 2 mLs (4 mg total) into the vein every 6 (six) hours as needed for nausea or vomiting. Replaces: ondansetron 4 MG  tablet   polyvinyl alcohol 1.4 % ophthalmic solution Commonly known as: LIQUIFILM TEARS Place 1 drop into both eyes 4 (four) times daily as needed for dry eyes.          TOTAL DISCHARGE TIME: 35 minutes  Kadar Chance [employer] on [email or web address]  02/05/2020, 10:01 AM

## 2020-02-05 NOTE — TOC Transition Note (Signed)
Transition of Care [facility]) - CM/SW Discharge Note   Patient Details  Name: [name] MRN: [id number] Date of Birth: May 07, 1951  Transition of Care [facility]) CM/SW Contact:  Lynnell Catalan, RN Phone Number: 02/05/2020, 11:04 AM   Clinical Narrative:     Pt to dc to [facility] today. Yellow DNR on chart for transport. [facility] contacted for dc. RN to call report to [phone]  Final next level of care: [place] Barriers to Discharge: No Barriers Identified   Patient Goals and CMS Choice Patient states their goals for this hospitalization and ongoing recovery are:: daughter-back to snf versus comfort care CMS Medicare.gov Compare Post Acute Care list provided to:: Patient Represenative (must comment) Choice offered to / list presented to : Adult Children       Readmission Risk Interventions Readmission Risk Prevention Plan 01/08/2020  Transportation Screening Complete  HRI or Home Care Consult Complete  Social Work Consult for [place] Planning/Counseling Complete  Palliative Care Screening Not Applicable  Medication Review Press photographer) Complete  Some recent data might be hidden

## 2020-02-06 LAB — FUNGUS CULTURE WITH STAIN

## 2020-02-06 LAB — FUNGUS CULTURE RESULT

## 2020-02-06 LAB — FUNGAL ORGANISM REFLEX

## 2020-02-10 DEATH — deceased
